# Patient Record
Sex: Female | Born: 1935 | Race: Black or African American | Hispanic: No | Smoking: Never smoker
Health system: Southern US, Community
[De-identification: ages and names within clinical notes are randomized; demographics above are authoritative.]

## PROBLEM LIST (undated history)

## (undated) DIAGNOSIS — C801 Malignant (primary) neoplasm, unspecified: Secondary | ICD-10-CM

## (undated) DIAGNOSIS — I10 Essential (primary) hypertension: Secondary | ICD-10-CM

## (undated) DIAGNOSIS — I480 Paroxysmal atrial fibrillation: Secondary | ICD-10-CM

## (undated) DIAGNOSIS — J45909 Unspecified asthma, uncomplicated: Secondary | ICD-10-CM

## (undated) DIAGNOSIS — R319 Hematuria, unspecified: Secondary | ICD-10-CM

## (undated) DIAGNOSIS — E876 Hypokalemia: Secondary | ICD-10-CM

## (undated) DIAGNOSIS — I639 Cerebral infarction, unspecified: Secondary | ICD-10-CM

## (undated) DIAGNOSIS — R1310 Dysphagia, unspecified: Secondary | ICD-10-CM

## (undated) HISTORY — DX: Dysphagia, unspecified: R13.10

## (undated) HISTORY — DX: Paroxysmal atrial fibrillation: I48.0

## (undated) HISTORY — PX: MYOMECTOMY: SHX85

## (undated) HISTORY — DX: Cerebral infarction, unspecified: I63.9

## (undated) HISTORY — DX: Hematuria, unspecified: R31.9

## (undated) HISTORY — PX: NECK SURGERY: SHX720

## (undated) HISTORY — PX: CERVICAL SPINE SURGERY: SHX589

## (undated) HISTORY — DX: Malignant (primary) neoplasm, unspecified: C80.1

## (undated) HISTORY — DX: Essential (primary) hypertension: I10

## (undated) HISTORY — DX: Hypokalemia: E87.6

## (undated) HISTORY — PX: ECTOPIC PREGNANCY SURGERY: SHX613

## (undated) HISTORY — PX: BREAST SURGERY: SHX581

## (undated) HISTORY — PX: APPENDECTOMY: SHX54

## (undated) HISTORY — DX: Unspecified asthma, uncomplicated: J45.909

## (undated) HISTORY — PX: ABDOMINAL HYSTERECTOMY: SHX81

---

## 1998-06-06 ENCOUNTER — Ambulatory Visit (HOSPITAL_COMMUNITY): Admission: RE | Admit: 1998-06-06 | Discharge: 1998-06-06 | Payer: Self-pay | Admitting: Obstetrics and Gynecology

## 1998-06-13 ENCOUNTER — Ambulatory Visit (HOSPITAL_COMMUNITY): Admission: RE | Admit: 1998-06-13 | Discharge: 1998-06-13 | Payer: Self-pay | Admitting: Obstetrics and Gynecology

## 1998-10-31 ENCOUNTER — Other Ambulatory Visit: Admission: RE | Admit: 1998-10-31 | Discharge: 1998-10-31 | Payer: Self-pay | Admitting: Obstetrics and Gynecology

## 2000-04-12 ENCOUNTER — Other Ambulatory Visit: Admission: RE | Admit: 2000-04-12 | Discharge: 2000-04-12 | Payer: Self-pay | Admitting: Obstetrics and Gynecology

## 2000-08-22 ENCOUNTER — Encounter: Payer: Self-pay | Admitting: Internal Medicine

## 2000-08-22 ENCOUNTER — Encounter: Admission: RE | Admit: 2000-08-22 | Discharge: 2000-08-22 | Payer: Self-pay | Admitting: Internal Medicine

## 2001-04-12 ENCOUNTER — Other Ambulatory Visit: Admission: RE | Admit: 2001-04-12 | Discharge: 2001-04-12 | Payer: Self-pay | Admitting: Obstetrics and Gynecology

## 2003-04-16 ENCOUNTER — Other Ambulatory Visit: Admission: RE | Admit: 2003-04-16 | Discharge: 2003-04-16 | Payer: Self-pay | Admitting: Obstetrics and Gynecology

## 2003-06-14 ENCOUNTER — Encounter: Admission: RE | Admit: 2003-06-14 | Discharge: 2003-06-14 | Payer: Self-pay | Admitting: Internal Medicine

## 2003-11-05 ENCOUNTER — Encounter: Admission: RE | Admit: 2003-11-05 | Discharge: 2003-11-05 | Payer: Self-pay | Admitting: Internal Medicine

## 2004-07-08 ENCOUNTER — Emergency Department (HOSPITAL_COMMUNITY): Admission: EM | Admit: 2004-07-08 | Discharge: 2004-07-08 | Payer: Self-pay | Admitting: Emergency Medicine

## 2004-08-29 ENCOUNTER — Emergency Department (HOSPITAL_COMMUNITY): Admission: EM | Admit: 2004-08-29 | Discharge: 2004-08-30 | Payer: Self-pay | Admitting: Emergency Medicine

## 2005-06-11 ENCOUNTER — Other Ambulatory Visit: Admission: RE | Admit: 2005-06-11 | Discharge: 2005-06-11 | Payer: Self-pay | Admitting: Obstetrics and Gynecology

## 2005-09-13 ENCOUNTER — Ambulatory Visit: Payer: Self-pay | Admitting: Internal Medicine

## 2005-09-22 ENCOUNTER — Ambulatory Visit: Payer: Self-pay | Admitting: Internal Medicine

## 2009-09-15 ENCOUNTER — Ambulatory Visit (HOSPITAL_COMMUNITY): Admission: RE | Admit: 2009-09-15 | Discharge: 2009-09-15 | Payer: Self-pay | Admitting: Surgery

## 2009-09-30 ENCOUNTER — Ambulatory Visit: Payer: Self-pay | Admitting: Oncology

## 2009-10-13 LAB — CBC WITH DIFFERENTIAL/PLATELET
BASO%: 0.2 % (ref 0.0–2.0)
Basophils Absolute: 0 10*3/uL (ref 0.0–0.1)
EOS%: 1.3 % (ref 0.0–7.0)
Eosinophils Absolute: 0.1 10*3/uL (ref 0.0–0.5)
HCT: 40.8 % (ref 34.8–46.6)
HGB: 13.7 g/dL (ref 11.6–15.9)
MCV: 93.9 fL (ref 79.5–101.0)
MONO%: 5.8 % (ref 0.0–14.0)
Platelets: 180 10*3/uL (ref 145–400)
RDW: 12.5 % (ref 11.2–14.5)
lymph#: 1.8 10*3/uL (ref 0.9–3.3)

## 2009-10-13 LAB — COMPREHENSIVE METABOLIC PANEL
BUN: 16 mg/dL (ref 6–23)
Chloride: 103 mEq/L (ref 96–112)
Creatinine, Ser: 0.78 mg/dL (ref 0.40–1.20)
Potassium: 3.8 mEq/L (ref 3.5–5.3)
Total Protein: 7.6 g/dL (ref 6.0–8.3)

## 2009-12-04 ENCOUNTER — Ambulatory Visit: Payer: Self-pay | Admitting: Oncology

## 2010-07-13 LAB — CBC
HCT: 42.1 % (ref 36.0–46.0)
Hemoglobin: 14.5 g/dL (ref 12.0–15.0)
MCHC: 34.5 g/dL (ref 30.0–36.0)
MCV: 94.3 fL (ref 78.0–100.0)
Platelets: 178 10*3/uL (ref 150–400)
RBC: 4.47 MIL/uL (ref 3.87–5.11)
RDW: 12.7 % (ref 11.5–15.5)
WBC: 5 10*3/uL (ref 4.0–10.5)

## 2010-09-25 ENCOUNTER — Encounter (INDEPENDENT_AMBULATORY_CARE_PROVIDER_SITE_OTHER): Payer: Self-pay | Admitting: Surgery

## 2010-11-09 ENCOUNTER — Encounter (INDEPENDENT_AMBULATORY_CARE_PROVIDER_SITE_OTHER): Payer: Self-pay | Admitting: Surgery

## 2010-11-10 ENCOUNTER — Encounter (INDEPENDENT_AMBULATORY_CARE_PROVIDER_SITE_OTHER): Payer: Self-pay | Admitting: Surgery

## 2010-11-10 ENCOUNTER — Ambulatory Visit (INDEPENDENT_AMBULATORY_CARE_PROVIDER_SITE_OTHER): Payer: MEDICARE | Admitting: Surgery

## 2010-11-10 VITALS — Temp 97.7°F | Wt 163.0 lb

## 2010-11-10 DIAGNOSIS — D0590 Unspecified type of carcinoma in situ of unspecified breast: Secondary | ICD-10-CM

## 2010-11-10 DIAGNOSIS — D0510 Intraductal carcinoma in situ of unspecified breast: Secondary | ICD-10-CM

## 2010-11-10 NOTE — Progress Notes (Signed)
Subjective:     Patient ID: Joy Holden, female   DOB: May 03, 1924, 77 y.o.   MRN: [id number]  HPI  She is here for long-term followup status post excision of left breast ductal carcinoma in situ. This was a year ago. She refused any further therapy. She currently has no complaints. She examines herself regularly. She denies nipple discharge. Review of Systems     Objective:   Physical Exam On examination the left breast, her incision is well-healed. There are no palpable masses. There is no axillary adenopathy.    Assessment:     Patient here for long-term followup of left breast ductal carcinoma in situ  Most recent mammograms are negative.    Plan:     She will continue her self examinations and I will see her back in one year

## 2011-11-11 ENCOUNTER — Encounter (INDEPENDENT_AMBULATORY_CARE_PROVIDER_SITE_OTHER): Payer: Self-pay

## 2011-11-16 ENCOUNTER — Ambulatory Visit (INDEPENDENT_AMBULATORY_CARE_PROVIDER_SITE_OTHER): Payer: Medicare Other | Admitting: Surgery

## 2011-11-16 ENCOUNTER — Encounter (INDEPENDENT_AMBULATORY_CARE_PROVIDER_SITE_OTHER): Payer: Self-pay | Admitting: Surgery

## 2011-11-16 VITALS — BP 142/88 | HR 45 | Temp 98.4°F | Ht 65.0 in | Wt 158.8 lb

## 2011-11-16 DIAGNOSIS — D0510 Intraductal carcinoma in situ of unspecified breast: Secondary | ICD-10-CM | POA: Insufficient documentation

## 2011-11-16 DIAGNOSIS — D0590 Unspecified type of carcinoma in situ of unspecified breast: Secondary | ICD-10-CM

## 2011-11-16 NOTE — Progress Notes (Signed)
Subjective:     Patient ID: Joy Holden, female   DOB: 1924/11/09, 78 y.o.   MRN: [id number]  HPI She is here today again for a long-term followup status post left breast lumpectomy and left sentinel lymph node biopsy for high-grade ductal carcinoma in situ in 2011. She has no complaints regarding her breast. She denies nipple discharge or palpable masses. Her most recent mammograms showed no evidence of recurrence or suspicious areas.  Review of Systems     Objective:   Physical Exam On exam, the left breast incision is well healed. There are no palpable masses. The areolar normal. There is no left axillary lymphadenopathy.    Assessment:     Long-term followup left breast ductal carcinoma in situ. Other than surgery, she has had no other therapy. Again, she denied radiation or anti-hormonal treatment.    Plan:     She will continue her self examinations and yearly mammograms. I will see her back in one year

## 2011-11-18 ENCOUNTER — Encounter (INDEPENDENT_AMBULATORY_CARE_PROVIDER_SITE_OTHER): Payer: Self-pay

## 2012-11-21 ENCOUNTER — Ambulatory Visit (INDEPENDENT_AMBULATORY_CARE_PROVIDER_SITE_OTHER): Payer: Medicare Other | Admitting: Surgery

## 2012-11-21 ENCOUNTER — Encounter (INDEPENDENT_AMBULATORY_CARE_PROVIDER_SITE_OTHER): Payer: Self-pay | Admitting: Surgery

## 2012-11-21 VITALS — BP 148/80 | HR 74 | Resp 14 | Ht 64.0 in | Wt 158.8 lb

## 2012-11-21 DIAGNOSIS — Z853 Personal history of malignant neoplasm of breast: Secondary | ICD-10-CM

## 2012-11-21 NOTE — Patient Instructions (Signed)
Patient ID: Joy Holden, female   DOB: July 08, 1924, 79 y.o.   MRN: [id number]  HPI She is here for her yearly followup status post left breast lumpectomy for ductal carcinoma in situ in 2011. Again, she refused radiation therapy or anti-hormonal therapy. She has been doing very well and her followup mammograms have remained negative. She has no complains today. She denies nipple discharge.  Review of Systems     Objective:   Physical Exam On exam, her incision and the breast is well healed and there are no palpable left breast masses. There is minimal shotty adenopathy bilaterally    Assessment:     Long-term followup with a history of DCIS     Plan:     She will continue her yearly mammograms and examinations. I will see her back in one year

## 2012-11-21 NOTE — Progress Notes (Signed)
Subjective:     Patient ID: Joy Holden, female   DOB: 10/06/1924, 79 y.o.   MRN: [id number]  HPI She is here for her yearly followup status post left breast lumpectomy for ductal carcinoma in situ in 2011. Again, she refused radiation therapy or anti-hormonal therapy. She has been doing very well and her followup mammograms have remained negative. She has no complains today. She denies nipple discharge.  Review of Systems     Objective:   Physical Exam On exam, her incision and the breast is well healed and there are no palpable left breast masses. There is minimal shotty adenopathy bilaterally    Assessment:     Long-term followup with a history of DCIS     Plan:     She will continue her yearly mammograms and examinations. I will see her back in one year       

## 2012-11-21 NOTE — Progress Notes (Signed)
Subjective:     Patient ID: Joy Holden, female   DOB: 09-29-1924, 79 y.o.   MRN: [id number]  HPI She is here for her yearly followup status post left breast lumpectomy for ductal carcinoma in situ in 2011. Again, she refused radiation therapy or anti-hormonal therapy. She has been doing very well and her followup mammograms have remained negative. She has no complains today. She denies nipple discharge.  Review of Systems     Objective:   Physical Exam On exam, her incision and the breast is well healed and there are no palpable left breast masses. There is minimal shotty adenopathy bilaterally    Assessment:     Long-term followup with a history of DCIS     Plan:     She will continue her yearly mammograms and examinations. I will see her back in one year

## 2012-11-27 ENCOUNTER — Encounter (INDEPENDENT_AMBULATORY_CARE_PROVIDER_SITE_OTHER): Payer: Self-pay

## 2013-10-22 ENCOUNTER — Ambulatory Visit (INDEPENDENT_AMBULATORY_CARE_PROVIDER_SITE_OTHER): Payer: Medicare Other | Admitting: Surgery

## 2013-10-22 ENCOUNTER — Encounter (INDEPENDENT_AMBULATORY_CARE_PROVIDER_SITE_OTHER): Payer: Self-pay | Admitting: Surgery

## 2013-10-22 VITALS — BP 123/77 | HR 70 | Temp 98.6°F | Resp 18 | Ht 65.0 in | Wt 155.0 lb

## 2013-10-22 DIAGNOSIS — Z853 Personal history of malignant neoplasm of breast: Secondary | ICD-10-CM

## 2013-10-22 NOTE — Progress Notes (Signed)
Subjective:     Patient ID: Joy Holden, female   DOB: 1925/01/18, 80 y.o.   MRN: [id number]  HPI She is here for her one-year followup for a long-term history of left breast ductal carcinoma in situ. She is now 4 years out from her original surgery. Again, she refused radiation for anti-hormonal therapy postoperatively. She is doing well and has no complaints  Review of Systems     Objective:   Physical Exam  on exam,both breasts have no masses and no axillary adenopathy. Her left breast incision is well-healed.  Her mammograms from June of this year are unremarkable with no evidence of suspicious lesions    Assessment:     Patient stable with a history of left breast cancer     Plan:     She will continue her current self examinations and yearly mammograms. I will see her back in one year

## 2013-10-31 ENCOUNTER — Encounter (INDEPENDENT_AMBULATORY_CARE_PROVIDER_SITE_OTHER): Payer: Self-pay

## 2014-09-12 ENCOUNTER — Telehealth: Payer: Self-pay | Admitting: Internal Medicine

## 2014-09-12 NOTE — Telephone Encounter (Signed)
Received records from [facility] for appointment on 11/06/14 with Dr Debara Pickett.  Records given to [facility] (medical records) for Dr Lysbeth Penner schedule on 11/05/13. lp

## 2014-09-25 ENCOUNTER — Telehealth: Payer: Self-pay | Admitting: Internal Medicine

## 2014-09-25 NOTE — Telephone Encounter (Signed)
09/24/2014 Received referral packet from [facility] for appointment on 11/06/2014 with Dr. Debara Pickett records given to Dr [facility]

## 2014-11-06 ENCOUNTER — Ambulatory Visit: Payer: Self-pay | Admitting: Internal Medicine

## 2015-02-04 NOTE — Progress Notes (Addendum)
Cardiology Office Note   Date:  02/07/2015   ID:  Joy Holden, DOB Apr 05, 1925, MRN [id number]  PCP:  Antony Blackbird, MD  Cardiologist:   Sharol Harness, MD   Chief Complaint  Patient presents with  . New Evaluation    pt states she had the flu in february/pt states she does not take flu shots or shots of any kind  . Edema    in left leg when it is hot or humid//in right leg, started after the flu  . Shortness of Breath    on exertion      History of Present Illness: Joy Holden is a 81 y.o. female with hypertension who presents for management of atrial fibrillation.  Ms. Kiely was found to have atrial fibrillation in February 2016 at which time she had the flu. She felt palpitations and diaphoresis. She was started on metoprolol and does not remember being asked to start a blood thinner. Since then she has not noted any further palpitations. She denies any chest pain, shortness of breath, lightheadedness or dizziness. She does have some chronic left lower extremity edema that she attributes to having phlebitis in the past.  She followed up with her PCP, who referred her to cardiology, presumably due to a diagnosis of atrial fibrillation.    Ms. Boonstra is very reluctant about receiving medical care.  She had 2 children who both died. Her son went for a heart catheterization and somehow ended up with splenic rupture and died on the cath table. A year later her daughter died of pneumonia. Given that her son died of medical error and she feels that her daughter should have been saved, she is reluctant to seek medical care.  Ms. Kimmer does not get formal exercise but she is very active.  She mows her own lawn with both a push and riding mower.  She also likes to garden and raises green beans, spinach, asparagus, squash, cucumbers, tomatoes, eggplant's, and many other vegetables. She does not get any chest pain with these activities but does have shortness of breath with extreme  exertion.   Past Medical History  Diagnosis Date  . Hypertension   . Cancer   . Asthma     Past Surgical History  Procedure Laterality Date  . Appendectomy    . Abdominal hysterectomy      PARTIAL  . Cervical spine surgery    . Myomectomy    . Breast surgery    . Ectopic pregnancy surgery    . Neck surgery      c-spine     Current Outpatient Prescriptions  Medication Sig Dispense Refill  . b complex vitamins tablet Take 1 tablet by mouth daily.      . Cholecalciferol (VITAMIN D3) 1000 UNITS CAPS Take by mouth daily.    Marland Kitchen diltiazem (TIAZAC) 180 MG 24 hr capsule Take 180 mg by mouth daily.      Marland Kitchen GARLIC PO Take 1 each by mouth daily. One clove per day     . Ginger, Zingiber officinalis, (GINGER ROOT PO) Take 1 each by mouth daily. One fresh slice daily      . lisinopril (PRINIVIL,ZESTRIL) 40 MG tablet Take 40 mg by mouth daily.  0  . magnesium gluconate (MAGONATE) 500 MG tablet Take 500 mg by mouth 2 (two) times daily.     . metoprolol (TOPROL-XL) 100 MG 24 hr tablet Take 100 mg by mouth daily.      Marland Kitchen  Multiple Vitamin (MULTI VITAMIN DAILY PO) Take 1 tablet by mouth daily.    . potassium chloride SA (K-DUR,KLOR-CON) 20 MEQ tablet Take 20 mEq by mouth 2 (two) times daily.      . vitamin C (ASCORBIC ACID) 500 MG tablet Take 500 mg by mouth daily.      . vitamin E 400 UNIT capsule Take 400 Units by mouth daily.     . hydrALAZINE (APRESOLINE) 25 MG tablet Take 1 tablet (25 mg total) by mouth 3 (three) times daily. 90 tablet 11   No current facility-administered medications for this visit.    Allergies:   Penicillins; Aspirin; and Sulfur    Social History:  The patient  reports that she has never smoked. She has never used smokeless tobacco. She reports that she does not drink alcohol or use illicit drugs.   Family History:  The patient's family history includes Cancer in her sister; Hypertension in her mother.    ROS:  Please see the history of present illness.    Otherwise, review of systems are positive for none.   All other systems are reviewed and negative.    PHYSICAL EXAM: VS:  BP 200/98 mmHg  Pulse 56  Ht 5\' 5"  (1.651 m)  Wt 65.817 kg (145 lb 1.6 oz)  BMI 24.15 kg/m2 , BMI Body mass index is 24.15 kg/(m^2). GENERAL:  Well appearing HEENT:  Pupils equal round and reactive, fundi not visualized, oral mucosa unremarkable NECK:  No jugular venous distention, waveform within normal limits, carotid upstroke brisk and symmetric, no bruits, no thyromegaly LYMPHATICS:  No cervical adenopathy LUNGS:  Clear to auscultation bilaterally HEART:  Irregularly irregular.  PMI not displaced or sustained,S1 and S2 within normal limits, no S3, no S4, no clicks, no rubs, no murmurs ABD:  Flat, positive bowel sounds normal in frequency in pitch, no bruits, no rebound, no guarding, no midline pulsatile mass, no hepatomegaly, no splenomegaly EXT:  2 plus pulses throughout, no edema, no cyanosis no clubbing SKIN:  No rashes no nodules NEURO:  Cranial nerves II through XII grossly intact, motor grossly intact throughout PSYCH:  Cognitively intact, oriented to person place and time   EKG:  EKG is ordered today. The ekg ordered today demonstrates atrial fibrillation.  Rate 56 bpm.  Inferolateral T wave inversions.   Recent Labs: No results found for requested labs within last 365 days.    Lipid Panel No results found for: CHOL, TRIG, HDL, CHOLHDL, VLDL, LDLCALC, LDLDIRECT 01/22/15: Chol 140, tri 55, HDL 49, LDL 80 Na 139, K 4.1, BUN 18, creatinine 0.75  Wt Readings from Last 3 Encounters:  02/05/15 65.817 kg (145 lb 1.6 oz)  10/22/13 70.308 kg (155 lb)  11/21/12 72.031 kg (158 lb 12.8 oz)      ASSESSMENT AND PLAN:  # Atrial fibrillation: Rate is well-controlled.  In fact it is low.  However she denies exertional dyspnea.  Given that her blood pressure is high, I would prefer not to lower her metoprolol.  She declines anticoagulation despite my attempts  to discuss stroke risk.  This patients CHA2DS2-VASc Score and unadjusted Ischemic Stroke Rate (% per year) is equal to 3.2 % stroke rate/year from a score of 3  Above score calculated as 1 point each if present [CHF, HTN, DM, Vascular=MI/PAD/Aortic Plaque, Age if 65-74, or Female] Above score calculated as 2 points each if present [Age > 75, or Stroke/TIA/TE]   # Hypertension: Blood pressure is very poorly-controlled.  She is not willing  to take HCTZ because it made her urinate at night.  Norvasc caused lower extremity edema.  She also had an intolerances to carvedilol.  We will start hydralazine 25 mg tid.  I considered spironolactone, but this is unlikely to get her to goal.  It can be added as needed.  She will follow up with our pharmacist, Tommy Medal, in 2 weeks.     Current medicines are reviewed at length with the patient today.  The patient does not have concerns regarding medicines.  The following changes have been made:  Start hydralazine 25 mg tid.  Labs/ tests ordered today include:   Orders Placed This Encounter  Procedures  . EKG 12-Lead     Disposition:   FU with Donnis Pecha C. Oval Linsey, MD in 6 months, Tommy Medal, PharmD in 2 weeks.  I spent one hour with Ms. Reddy.  Signed, Sharol Harness, MD  02/07/2015 2:28 PM    [place]

## 2015-02-05 ENCOUNTER — Ambulatory Visit (INDEPENDENT_AMBULATORY_CARE_PROVIDER_SITE_OTHER): Payer: Medicare Other | Admitting: Cardiovascular Disease

## 2015-02-05 VITALS — BP 200/98 | HR 56 | Ht 65.0 in | Wt 145.1 lb

## 2015-02-05 DIAGNOSIS — I10 Essential (primary) hypertension: Secondary | ICD-10-CM

## 2015-02-05 MED ORDER — HYDRALAZINE HCL 25 MG PO TABS: 25.0000 mg | ORAL_TABLET | Freq: Three times a day (TID) | ORAL | Status: DC

## 2015-02-05 NOTE — Patient Instructions (Addendum)
Your physician has recommended you make the following change in your medication: START HYDRALAZINE 25MG   -  TAKE ONE TABLET 3 TIMES DAILY.  Your physician recommends that you schedule a follow-up appointment in: [place].  Your physician recommends that you schedule a follow-up appointment in: [place] DR. [place].

## 2015-02-07 ENCOUNTER — Encounter: Payer: Self-pay | Admitting: Cardiovascular Disease

## 2015-02-20 ENCOUNTER — Ambulatory Visit (INDEPENDENT_AMBULATORY_CARE_PROVIDER_SITE_OTHER): Payer: Medicare Other | Admitting: Pharmacist Clinician (PhC)/ Clinical Pharmacy Specialist

## 2015-02-20 ENCOUNTER — Encounter: Payer: Self-pay | Admitting: Pharmacist Clinician (PhC)/ Clinical Pharmacy Specialist

## 2015-02-20 VITALS — BP 162/82 | HR 64 | Ht 65.0 in | Wt 145.0 lb

## 2015-02-20 DIAGNOSIS — I10 Essential (primary) hypertension: Secondary | ICD-10-CM | POA: Diagnosis not present

## 2015-02-20 NOTE — Progress Notes (Signed)
02/20/2015 Joy Holden 1924/10/12 [id number] HPI:  Joy Holden is a 81 y.o. female patient of Dr Oval Linsey, with a [place] below who presents today for hypertension clinic evaluation. She saw Dr. Oval Linsey about 2 weeks ago for atrial fibrillation management as well as hypertension.  She has been taking diltiazem 180 mg once daily, lisinopril 40 mg once daily and metoprolol succinate 100 mg once daily.  She states that this controlled her BP fairly well until she had the flu earlier this year.  Since then it has been harder to keep it controlled.  She also has some fear/distrust of the medical community since her son died 4 years ago after a heart catheterization somehow led to a splenic rupture.  Her daughter then died just a year later with pneumonia.   She admits to becoming tense when going to see doctors, and she is tearful in the office today talking about how she still misses her children every day.  She does have 3 grandchildren and perks up a little when talking about them.  Cardiac Hx: AF, some chronic LEE (phlebitis in the past)  Social Hx: no tobacco, alcohol or caffeine  Diet: eats all home cooked foods, uses Ms Deliah Boston and no sodium.  Plenty of fruits and vegetables  Exercise: no formal exercise, but does her own yard work, including mowing and gardening  Home BP readings: has home cuff but has been hesitant to use it  Current antihypertensive medications: diltiazem 180, lisinopril 40, metoprolol succ 100 - all once daily   Current Outpatient Prescriptions  Medication Sig Dispense Refill  . hydrALAZINE (APRESOLINE) 25 MG tablet Take 25 mg by mouth 2 (two) times daily.    Marland Kitchen b complex vitamins tablet Take 1 tablet by mouth daily.      . Cholecalciferol (VITAMIN D3) 1000 UNITS CAPS Take by mouth daily.    Marland Kitchen diltiazem (TIAZAC) 180 MG 24 hr capsule Take 180 mg by mouth daily.      Marland Kitchen GARLIC PO Take 1 each by mouth daily. One clove per day     . Ginger, Zingiber officinalis,  (GINGER ROOT PO) Take 1 each by mouth daily. One fresh slice daily      . lisinopril (PRINIVIL,ZESTRIL) 40 MG tablet Take 40 mg by mouth daily.  0  . magnesium gluconate (MAGONATE) 500 MG tablet Take 500 mg by mouth 2 (two) times daily.     . metoprolol (TOPROL-XL) 100 MG 24 hr tablet Take 100 mg by mouth daily.      . Multiple Vitamin (MULTI VITAMIN DAILY PO) Take 1 tablet by mouth daily.    . potassium chloride SA (K-DUR,KLOR-CON) 20 MEQ tablet Take 20 mEq by mouth 2 (two) times daily.      . vitamin C (ASCORBIC ACID) 500 MG tablet Take 500 mg by mouth daily.      . vitamin E 400 UNIT capsule Take 400 Units by mouth daily.      No current facility-administered medications for this visit.    Allergies  Allergen Reactions  . Penicillins Rash  . Aspirin     Burning in stomach  . Sulfur Nausea Only    Past Medical History  Diagnosis Date  . Hypertension   . Cancer ([place])   . Asthma     Blood pressure 162/82, pulse 64, height 5\' 5"  (1.651 m), weight 145 lb (65.772 kg).    Tommy Medal PharmD CPP [facility]

## 2015-02-20 NOTE — Patient Instructions (Signed)
Return for a a follow up appointment in 3 weeks  Your blood pressure today is 162/82 (goal is <150/90)  Check your blood pressure at home several times each week and keep record of the readings.  Take your BP meds as follows:  Continue all current medications  Bring all of your meds, your BP cuff and your record of home blood pressures to your next appointment.  Exercise as you're able, try to walk approximately 30 minutes per day.  Keep salt intake to a minimum, especially watch canned and prepared boxed foods.  Eat more fresh fruits and vegetables and fewer canned items.  Avoid eating in fast food restaurants.    HOW TO TAKE YOUR BLOOD PRESSURE: . Rest 5 minutes before taking your blood pressure. .  Don't smoke or drink caffeinated beverages for at least 30 minutes before. . Take your blood pressure before (not after) you eat. . Sit comfortably with your back supported and both feet on the floor (don't cross your legs). . Elevate your arm to heart level on a table or a desk. . Use the proper sized cuff. It should fit smoothly and snugly around your bare upper arm. There should be enough room to slip a fingertip under the cuff. The bottom edge of the cuff should be 1 inch above the crease of the elbow. . Ideally, take 3 measurements at one sitting and record the average.

## 2015-02-20 NOTE — Assessment & Plan Note (Signed)
We had a good talk today about her general health and blood pressure.  She is adamant about trying more medications, and believes that fresh air and her vitamins are really what works best.  She did decrease her hydralazine to twice daily as it was causing some nausea.  She also complains about the bitter taste of the tablet, but is willing to continue twice daily for now.  I did ask her to get out her BP cuff at home and check her readings 3-4 times each week.  I believe some of her elevated BP is related to her tenseness in being in medical offices.  She was resistant to take home readings, but I explained that she may actually have better readings at home and may not need as much medication as her office readings would indicate.  I offered to have her call me in 2 weeks with the readings, but she said she would be willing to come back.  She will bring her cuff into the office so we can validate her home pressures.  I will see her in 3 weeks

## 2015-02-26 ENCOUNTER — Encounter: Payer: Self-pay | Admitting: Cardiovascular Disease

## 2015-03-13 ENCOUNTER — Ambulatory Visit: Payer: Medicare Other | Admitting: Pharmacist Clinician (PhC)/ Clinical Pharmacy Specialist

## 2015-03-17 ENCOUNTER — Ambulatory Visit (INDEPENDENT_AMBULATORY_CARE_PROVIDER_SITE_OTHER): Payer: Medicare Other | Admitting: Pharmacist Clinician (PhC)/ Clinical Pharmacy Specialist

## 2015-03-17 VITALS — BP 180/78 | HR 52 | Ht 65.0 in | Wt 144.8 lb

## 2015-03-17 DIAGNOSIS — I10 Essential (primary) hypertension: Secondary | ICD-10-CM | POA: Diagnosis not present

## 2015-03-17 NOTE — Patient Instructions (Addendum)
Return for a a follow up appointment in 2 months  Your blood pressure today is 180/78 (goal is <150/90)  Check your blood pressure at home several times per week and keep record of the readings.  Take your BP meds as follows: continue with your current medications  Join the [facility] and get plenty of exercise!  Bring all of your meds, your BP cuff and your record of home blood pressures to your next appointment.  Exercise as you're able, try to walk approximately 30 minutes per day.  Keep salt intake to a minimum, especially watch canned and prepared boxed foods.  Eat more fresh fruits and vegetables and fewer canned items.  Avoid eating in fast food restaurants.    HOW TO TAKE YOUR BLOOD PRESSURE: . Rest 5 minutes before taking your blood pressure. .  Don't smoke or drink caffeinated beverages for at least 30 minutes before. . Take your blood pressure before (not after) you eat. . Sit comfortably with your back supported and both feet on the floor (don't cross your legs). . Elevate your arm to heart level on a table or a desk. . Use the proper sized cuff. It should fit smoothly and snugly around your bare upper arm. There should be enough room to slip a fingertip under the cuff. The bottom edge of the cuff should be 1 inch above the crease of the elbow. . Ideally, take 3 measurements at one sitting and record the average.

## 2015-03-18 ENCOUNTER — Encounter: Payer: Self-pay | Admitting: Pharmacist Clinician (PhC)/ Clinical Pharmacy Specialist

## 2015-03-18 NOTE — Assessment & Plan Note (Signed)
Because her pressure is still elevated in the office I tried to convince her of the need to increase her hydralazine dose.  Suggested we could do 1.5 tablets bid rather than 1 tid, but she pointed out that this was still 3 tablets per day and her body would not accept that.  After a long discussion she assured me that she would be fine, she was planning to join the [facility] and start exercising, but that she did not need any further medications.  I suggested again that she try to take home BP readings and we could follow up in 2 months.  She seems agreeable to this, although I don't think there will be any need to follow her further if she does not check home BP readings.

## 2015-03-18 NOTE — Progress Notes (Signed)
03/18/2015 Joy Holden [birth year] [id number] HPI:  Joy Holden is a 81 y.o. female patient of Dr Oval Linsey, with a [place] below who presents today for hypertension clinic follow up. She saw Dr. Oval Linsey about 6 weeks ago for atrial fibrillation management as well as hypertension.  She has been taking diltiazem 180 mg once daily, lisinopril 40 mg once daily and metoprolol succinate 100 mg once daily.  Dr. Oval Linsey started her on hydralazine 25 mg tid and she followed up with me about 2 weeks later.  At that time she was only taking the hydralazine twice daily, as she stated she could not tolerate 3 tablets per day.  It made her feel worn out and it "wasn't good for her body".   At our last visit, we made no changes, but she agreed to take her BP at home, so that we could try and determine if she has mostly white coat hypertension.  She has some fear/distrust of the medical community since her son died 4 years ago after a heart catheterization somehow led to a splenic rupture.  Her daughter then died just a year later with pneumonia.   She admits to becoming tense when going to see doctors, and often talks about how she still misses her children every day.  She does have 3 grandchildren and perks up a little when talking about them.  At the end of that meeting, last month, she stated she would check home pressures as well as increase her activity/exercise, as she did not want to add any medications.  Today she come in with her BP cuff.  She only tried to use it twice, both times incorrectly and received an error message.  She did not have the patience to try further.  In office today it worked fine, although the reading was 20/10 points higher than office cuff.  She also has not increased her exercise.    Cardiac Hx: AF, some chronic LEE (phlebitis in the past)  Social Hx: no tobacco, alcohol or caffeine  Diet: eats all home cooked foods, uses Ms [employer]iah [employer] and no sodium.  Plenty of fruits and  vegetables  Exercise: no formal exercise, but does her own yard work, including mowing and gardening  Home BP readings: did not check  Current antihypertensive medications: diltiazem 180, lisinopril 40, metoprolol succ 100 - all once daily; hydralazine 25 mg bid   Current Outpatient Prescriptions  Medication Sig Dispense Refill  . b complex vitamins tablet Take 1 tablet by mouth daily.      . Cholecalciferol (VITAMIN D3) 1000 UNITS CAPS Take by mouth daily.    Marland Kitchen diltiazem (TIAZAC) 180 MG 24 hr capsule Take 180 mg by mouth daily.      Marland Kitchen GARLIC PO Take 1 each by mouth daily. One clove per day     . Ginger, Zingiber officinalis, (GINGER ROOT PO) Take 1 each by mouth daily. One fresh slice daily      . hydrALAZINE (APRESOLINE) 25 MG tablet Take 25 mg by mouth 2 (two) times daily.    Marland Kitchen lisinopril (PRINIVIL,ZESTRIL) 40 MG tablet Take 40 mg by mouth daily.  0  . magnesium gluconate (MAGONATE) 500 MG tablet Take 500 mg by mouth 2 (two) times daily.     . metoprolol (TOPROL-XL) 100 MG 24 hr tablet Take 100 mg by mouth daily.      . Multiple Vitamin (MULTI VITAMIN DAILY PO) Take 1 tablet by mouth daily.    Marland Kitchen  potassium chloride SA (K-DUR,KLOR-CON) 20 MEQ tablet Take 20 mEq by mouth 2 (two) times daily.      . vitamin C (ASCORBIC ACID) 500 MG tablet Take 500 mg by mouth daily.      . vitamin E 400 UNIT capsule Take 400 Units by mouth daily.      No current facility-administered medications for this visit.    Allergies  Allergen Reactions  . Penicillins Rash  . Aspirin     Burning in stomach  . Morphine     Other reaction(s): Delusions (intolerance)  . Sulfur Nausea Only    Past Medical History  Diagnosis Date  . Hypertension   . Cancer ([place])   . Asthma     Blood pressure 180/78, pulse 52, height 5\' 5"  (1.651 m), weight 144 lb 12.8 oz (65.681 kg).    Tommy Medal PharmD CPP [facility]

## 2015-07-19 ENCOUNTER — Emergency Department (HOSPITAL_COMMUNITY): Payer: PPO

## 2015-07-19 ENCOUNTER — Inpatient Hospital Stay (HOSPITAL_COMMUNITY): Payer: PPO | Admitting: Anesthesiology

## 2015-07-19 ENCOUNTER — Inpatient Hospital Stay (HOSPITAL_COMMUNITY): Payer: PPO

## 2015-07-19 ENCOUNTER — Encounter (HOSPITAL_COMMUNITY)
Admission: EM | Disposition: A | Discharge status: Skilled Nursing Facility | Payer: Self-pay | Source: Home / Self Care | Attending: Neurology

## 2015-07-19 ENCOUNTER — Inpatient Hospital Stay (HOSPITAL_COMMUNITY)
Admission: EM | Admit: 2015-07-19 | Discharge: 2015-07-28 | DRG: 023 | Disposition: A | Discharge status: Skilled Nursing Facility | Payer: PPO | Attending: Neurology | Admitting: Neurology

## 2015-07-19 ENCOUNTER — Encounter (HOSPITAL_COMMUNITY): Payer: Self-pay | Admitting: Radiology

## 2015-07-19 DIAGNOSIS — B9620 Unspecified Escherichia coli [E. coli] as the cause of diseases classified elsewhere: Secondary | ICD-10-CM | POA: Diagnosis not present

## 2015-07-19 DIAGNOSIS — R451 Restlessness and agitation: Secondary | ICD-10-CM | POA: Diagnosis present

## 2015-07-19 DIAGNOSIS — G8191 Hemiplegia, unspecified affecting right dominant side: Secondary | ICD-10-CM | POA: Diagnosis present

## 2015-07-19 DIAGNOSIS — R319 Hematuria, unspecified: Secondary | ICD-10-CM | POA: Diagnosis not present

## 2015-07-19 DIAGNOSIS — E876 Hypokalemia: Secondary | ICD-10-CM | POA: Diagnosis present

## 2015-07-19 DIAGNOSIS — I4891 Unspecified atrial fibrillation: Secondary | ICD-10-CM | POA: Diagnosis not present

## 2015-07-19 DIAGNOSIS — I6789 Other cerebrovascular disease: Secondary | ICD-10-CM | POA: Diagnosis not present

## 2015-07-19 DIAGNOSIS — R1310 Dysphagia, unspecified: Secondary | ICD-10-CM | POA: Diagnosis not present

## 2015-07-19 DIAGNOSIS — I63422 Cerebral infarction due to embolism of left anterior cerebral artery: Secondary | ICD-10-CM | POA: Diagnosis present

## 2015-07-19 DIAGNOSIS — I482 Chronic atrial fibrillation: Secondary | ICD-10-CM | POA: Diagnosis present

## 2015-07-19 DIAGNOSIS — Z886 Allergy status to analgesic agent status: Secondary | ICD-10-CM

## 2015-07-19 DIAGNOSIS — R29810 Facial weakness: Secondary | ICD-10-CM | POA: Diagnosis present

## 2015-07-19 DIAGNOSIS — R001 Bradycardia, unspecified: Secondary | ICD-10-CM | POA: Diagnosis present

## 2015-07-19 DIAGNOSIS — Z0189 Encounter for other specified special examinations: Secondary | ICD-10-CM

## 2015-07-19 DIAGNOSIS — R739 Hyperglycemia, unspecified: Secondary | ICD-10-CM | POA: Diagnosis present

## 2015-07-19 DIAGNOSIS — D696 Thrombocytopenia, unspecified: Secondary | ICD-10-CM | POA: Diagnosis present

## 2015-07-19 DIAGNOSIS — I6522 Occlusion and stenosis of left carotid artery: Secondary | ICD-10-CM

## 2015-07-19 DIAGNOSIS — I639 Cerebral infarction, unspecified: Secondary | ICD-10-CM | POA: Diagnosis present

## 2015-07-19 DIAGNOSIS — Z4659 Encounter for fitting and adjustment of other gastrointestinal appliance and device: Secondary | ICD-10-CM

## 2015-07-19 DIAGNOSIS — G8101 Flaccid hemiplegia affecting right dominant side: Secondary | ICD-10-CM

## 2015-07-19 DIAGNOSIS — I69320 Aphasia following cerebral infarction: Secondary | ICD-10-CM | POA: Diagnosis not present

## 2015-07-19 DIAGNOSIS — R29726 NIHSS score 26: Secondary | ICD-10-CM | POA: Diagnosis present

## 2015-07-19 DIAGNOSIS — Z978 Presence of other specified devices: Secondary | ICD-10-CM

## 2015-07-19 DIAGNOSIS — R4182 Altered mental status, unspecified: Secondary | ICD-10-CM | POA: Diagnosis present

## 2015-07-19 DIAGNOSIS — Z66 Do not resuscitate: Secondary | ICD-10-CM | POA: Diagnosis present

## 2015-07-19 DIAGNOSIS — I63412 Cerebral infarction due to embolism of left middle cerebral artery: Principal | ICD-10-CM | POA: Diagnosis present

## 2015-07-19 DIAGNOSIS — J95821 Acute postprocedural respiratory failure: Secondary | ICD-10-CM | POA: Diagnosis not present

## 2015-07-19 DIAGNOSIS — R471 Dysarthria and anarthria: Secondary | ICD-10-CM | POA: Diagnosis present

## 2015-07-19 DIAGNOSIS — I160 Hypertensive urgency: Secondary | ICD-10-CM

## 2015-07-19 DIAGNOSIS — I63032 Cerebral infarction due to thrombosis of left carotid artery: Secondary | ICD-10-CM

## 2015-07-19 DIAGNOSIS — Q251 Coarctation of aorta: Secondary | ICD-10-CM

## 2015-07-19 DIAGNOSIS — I509 Heart failure, unspecified: Secondary | ICD-10-CM

## 2015-07-19 DIAGNOSIS — H53461 Homonymous bilateral field defects, right side: Secondary | ICD-10-CM | POA: Diagnosis present

## 2015-07-19 DIAGNOSIS — I480 Paroxysmal atrial fibrillation: Secondary | ICD-10-CM | POA: Diagnosis present

## 2015-07-19 DIAGNOSIS — I340 Nonrheumatic mitral (valve) insufficiency: Secondary | ICD-10-CM | POA: Diagnosis present

## 2015-07-19 DIAGNOSIS — N390 Urinary tract infection, site not specified: Secondary | ICD-10-CM | POA: Diagnosis not present

## 2015-07-19 DIAGNOSIS — I481 Persistent atrial fibrillation: Secondary | ICD-10-CM | POA: Diagnosis not present

## 2015-07-19 DIAGNOSIS — E785 Hyperlipidemia, unspecified: Secondary | ICD-10-CM | POA: Diagnosis present

## 2015-07-19 DIAGNOSIS — R4701 Aphasia: Secondary | ICD-10-CM | POA: Diagnosis not present

## 2015-07-19 DIAGNOSIS — J9601 Acute respiratory failure with hypoxia: Secondary | ICD-10-CM | POA: Diagnosis not present

## 2015-07-19 DIAGNOSIS — I63132 Cerebral infarction due to embolism of left carotid artery: Secondary | ICD-10-CM | POA: Diagnosis not present

## 2015-07-19 DIAGNOSIS — I10 Essential (primary) hypertension: Secondary | ICD-10-CM | POA: Diagnosis present

## 2015-07-19 DIAGNOSIS — J9620 Acute and chronic respiratory failure, unspecified whether with hypoxia or hypercapnia: Secondary | ICD-10-CM | POA: Diagnosis not present

## 2015-07-19 HISTORY — PX: RADIOLOGY WITH ANESTHESIA: SHX6223

## 2015-07-19 LAB — PROTIME-INR
INR: 1.2 (ref 0.00–1.49)
Prothrombin Time: 15.3 seconds — ABNORMAL HIGH (ref 11.6–15.2)

## 2015-07-19 LAB — CBC
HEMATOCRIT: 44 % (ref 36.0–46.0)
HEMOGLOBIN: 14.4 g/dL (ref 12.0–15.0)
MCH: 31 pg (ref 26.0–34.0)
MCHC: 32.7 g/dL (ref 30.0–36.0)
MCV: 94.8 fL (ref 78.0–100.0)
Platelets: 138 10*3/uL — ABNORMAL LOW (ref 150–400)
RBC: 4.64 MIL/uL (ref 3.87–5.11)
RDW: 12.7 % (ref 11.5–15.5)
WBC: 5.9 10*3/uL (ref 4.0–10.5)

## 2015-07-19 LAB — I-STAT TROPONIN, ED: TROPONIN I, POC: 0 ng/mL (ref 0.00–0.08)

## 2015-07-19 LAB — URINALYSIS, ROUTINE W REFLEX MICROSCOPIC
BILIRUBIN URINE: NEGATIVE
Glucose, UA: NEGATIVE mg/dL
HGB URINE DIPSTICK: NEGATIVE
Ketones, ur: NEGATIVE mg/dL
Leukocytes, UA: NEGATIVE
NITRITE: NEGATIVE
Protein, ur: NEGATIVE mg/dL
Specific Gravity, Urine: 1.023 (ref 1.005–1.030)
pH: 6.5 (ref 5.0–8.0)

## 2015-07-19 LAB — COMPREHENSIVE METABOLIC PANEL
ALK PHOS: 107 U/L (ref 38–126)
ALT: 26 U/L (ref 14–54)
AST: 25 U/L (ref 15–41)
Albumin: 4.2 g/dL (ref 3.5–5.0)
Anion gap: 10 (ref 5–15)
BILIRUBIN TOTAL: 0.7 mg/dL (ref 0.3–1.2)
BUN: 21 mg/dL — AB (ref 6–20)
CALCIUM: 9.3 mg/dL (ref 8.9–10.3)
CHLORIDE: 108 mmol/L (ref 101–111)
CO2: 23 mmol/L (ref 22–32)
CREATININE: 0.83 mg/dL (ref 0.44–1.00)
Glucose, Bld: 140 mg/dL — ABNORMAL HIGH (ref 65–99)
Potassium: 3.8 mmol/L (ref 3.5–5.1)
Sodium: 141 mmol/L (ref 135–145)
Total Protein: 7.2 g/dL (ref 6.5–8.1)

## 2015-07-19 LAB — DIFFERENTIAL
BASOS ABS: 0 10*3/uL (ref 0.0–0.1)
BASOS PCT: 1 %
Eosinophils Absolute: 0.1 10*3/uL (ref 0.0–0.7)
Eosinophils Relative: 2 %
LYMPHS ABS: 2.6 10*3/uL (ref 0.7–4.0)
LYMPHS PCT: 44 %
MONO ABS: 0.5 10*3/uL (ref 0.1–1.0)
MONOS PCT: 8 %
NEUTROS ABS: 2.7 10*3/uL (ref 1.7–7.7)
Neutrophils Relative %: 46 %

## 2015-07-19 LAB — ETHANOL

## 2015-07-19 LAB — I-STAT CHEM 8, ED
BUN: 26 mg/dL — ABNORMAL HIGH (ref 6–20)
CALCIUM ION: 1.05 mmol/L — AB (ref 1.13–1.30)
CHLORIDE: 106 mmol/L (ref 101–111)
CREATININE: 0.8 mg/dL (ref 0.44–1.00)
GLUCOSE: 134 mg/dL — AB (ref 65–99)
HCT: 47 % — ABNORMAL HIGH (ref 36.0–46.0)
Hemoglobin: 16 g/dL — ABNORMAL HIGH (ref 12.0–15.0)
POTASSIUM: 3.7 mmol/L (ref 3.5–5.1)
Sodium: 143 mmol/L (ref 135–145)
TCO2: 24 mmol/L (ref 0–100)

## 2015-07-19 LAB — APTT: APTT: 31 s (ref 24–37)

## 2015-07-19 LAB — RAPID URINE DRUG SCREEN, HOSP PERFORMED
AMPHETAMINES: NOT DETECTED
BARBITURATES: NOT DETECTED
BENZODIAZEPINES: NOT DETECTED
Cocaine: NOT DETECTED
Opiates: NOT DETECTED
Tetrahydrocannabinol: NOT DETECTED

## 2015-07-19 LAB — CBG MONITORING, ED: GLUCOSE-CAPILLARY: 115 mg/dL — AB (ref 65–99)

## 2015-07-19 SURGERY — RADIOLOGY WITH ANESTHESIA
Anesthesia: General

## 2015-07-19 MED ORDER — ALTEPLASE (STROKE) FULL DOSE INFUSION
0.9000 mg/kg | Freq: Once | INTRAVENOUS | Status: AC
  Administered 2015-07-19: 63 mg via INTRAVENOUS
  Filled 2015-07-19: qty 100

## 2015-07-19 MED ORDER — EPHEDRINE SULFATE 50 MG/ML IJ SOLN
INTRAMUSCULAR | Status: DC | PRN
  Administered 2015-07-19 – 2015-07-20 (×3): 5 mg via INTRAVENOUS

## 2015-07-19 MED ORDER — ALTEPLASE 30 MG/30 ML FOR INTERV. RAD
1.0000 mg | INTRA_ARTERIAL | Status: AC | PRN
Start: 2015-07-19 — End: 2015-07-20
  Administered 2015-07-19: 5 mg via INTRA_ARTERIAL
  Filled 2015-07-19: qty 30

## 2015-07-19 MED ORDER — LIDOCAINE HCL (CARDIAC) 20 MG/ML IV SOLN
INTRAVENOUS | Status: DC | PRN
  Administered 2015-07-19: 60 mg via INTRAVENOUS

## 2015-07-19 MED ORDER — HYDRALAZINE HCL 20 MG/ML IJ SOLN
20.0000 mg | Freq: Once | INTRAMUSCULAR | Status: AC
  Administered 2015-07-19: 20 mg via INTRAVENOUS

## 2015-07-19 MED ORDER — SUCCINYLCHOLINE CHLORIDE 20 MG/ML IJ SOLN
INTRAMUSCULAR | Status: DC | PRN
  Administered 2015-07-19: 100 mg via INTRAVENOUS

## 2015-07-19 MED ORDER — HYDRALAZINE HCL 20 MG/ML IJ SOLN
INTRAMUSCULAR | Status: AC
  Filled 2015-07-19: qty 1

## 2015-07-19 MED ORDER — ARTIFICIAL TEARS OP OINT
TOPICAL_OINTMENT | OPHTHALMIC | Status: DC | PRN
  Administered 2015-07-19: 1 via OPHTHALMIC

## 2015-07-19 MED ORDER — PHENYLEPHRINE HCL 10 MG/ML IJ SOLN
INTRAMUSCULAR | Status: DC | PRN
  Administered 2015-07-19: 80 ug via INTRAVENOUS

## 2015-07-19 MED ORDER — VANCOMYCIN HCL IN DEXTROSE 1-5 GM/200ML-% IV SOLN
1000.0000 mg | Freq: Once | INTRAVENOUS | Status: AC
  Administered 2015-07-19: 1000 mg via INTRAVENOUS
  Filled 2015-07-19: qty 200

## 2015-07-19 MED ORDER — NITROGLYCERIN 1 MG/10 ML FOR IR/CATH LAB
INTRA_ARTERIAL | Status: AC
  Filled 2015-07-19: qty 10

## 2015-07-19 MED ORDER — FENTANYL CITRATE (PF) 100 MCG/2ML IJ SOLN
INTRAMUSCULAR | Status: DC | PRN
  Administered 2015-07-19: 150 ug via INTRAVENOUS

## 2015-07-19 MED ORDER — PROPOFOL 10 MG/ML IV BOLUS
INTRAVENOUS | Status: DC | PRN
  Administered 2015-07-19: 80 mg via INTRAVENOUS

## 2015-07-19 MED ORDER — ROCURONIUM BROMIDE 100 MG/10ML IV SOLN
INTRAVENOUS | Status: DC | PRN
  Administered 2015-07-19 – 2015-07-20 (×3): 50 mg via INTRAVENOUS

## 2015-07-19 MED ORDER — SODIUM CHLORIDE 0.9 % IV SOLN
INTRAVENOUS | Status: DC | PRN
  Administered 2015-07-19: 23:00:00 via INTRAVENOUS

## 2015-07-19 MED ORDER — LACTATED RINGERS IV SOLN
INTRAVENOUS | Status: DC | PRN
  Administered 2015-07-19: 23:00:00 via INTRAVENOUS

## 2015-07-19 MED ORDER — IOHEXOL 350 MG/ML SOLN
100.0000 mL | Freq: Once | INTRAVENOUS | Status: AC | PRN
  Administered 2015-07-19: 90 mL via INTRAVENOUS

## 2015-07-19 NOTE — ED Provider Notes (Signed)
Patient was found seated in her car at the bottom driveway less responsive. Paramedics noted patient to have right-sided weakness. On exam patient has left 4 days. Opens eyes to verbal stimulus. Code stroke called in the field. Patient seen by Dr. Nicole Kindred on arrival. TPA administered in the ED. She will be admitted to intensive care unit  Orlie Dakin, MD 07/20/15 [id number]

## 2015-07-19 NOTE — Anesthesia Preprocedure Evaluation (Signed)
Anesthesia Evaluation  Patient identified by MRN, date of birth, ID bandPreop documentation limited or incomplete due to emergent nature of procedure.  History of Anesthesia Complications Negative for: history of anesthetic complications  Airway Mallampati: II  TM Distance: >3 FB Neck ROM: full    Dental no notable dental hx.    Pulmonary neg pulmonary ROS, asthma ,    Pulmonary exam normal breath sounds clear to auscultation       Cardiovascular hypertension, negative cardio ROS Normal cardiovascular exam Rhythm:regular Rate:Normal     Neuro/Psych negative neurological ROS     GI/Hepatic negative GI ROS, Neg liver ROS,   Endo/Other  negative endocrine ROS  Renal/GU negative Renal ROS     Musculoskeletal   Abdominal   Peds  Hematology negative hematology ROS (+)   Anesthesia Other Findings   Reproductive/Obstetrics negative OB ROS                             Anesthesia Physical Anesthesia Plan  ASA: III and emergent  Anesthesia Plan: General   Post-op Pain Management:    Induction: Intravenous  Airway Management Planned: Oral ETT  Additional Equipment: Arterial line  Intra-op Plan:   Post-operative Plan: Post-operative intubation/ventilation  Informed Consent: I have reviewed the patients History and Physical, chart, labs and discussed the procedure including the risks, benefits and alternatives for the proposed anesthesia with the patient or authorized representative who has indicated his/her understanding and acceptance.   Dental Advisory Given  Plan Discussed with: Anesthesiologist, CRNA and Surgeon  Anesthesia Plan Comments: (Code stroke)        Anesthesia Quick Evaluation

## 2015-07-19 NOTE — ED Provider Notes (Signed)
CSN: [id number]     Arrival date & time 07/19/15  2114 History   First MD Initiated Contact with Patient 07/19/15 2122     Chief Complaint  Patient presents with  . Code Stroke   HPI  82 y.o. female with history of hypertension and atrial fibrillation, not on anticoagulation, who comes in as a code stroke that was called in the field. Patient was found by EMS in her car at the end of her driveway unresponsive. Patient noted to have right sided facial droop, right-sided weakness, and left-sided gaze. Last known normal unknown. Unable to obtain remainder of history given altered mental status. Level V caveat for altered mental status.   Past Medical History  Diagnosis Date  . Hypertension   . Cancer ([place])   . Asthma    Past Surgical History  Procedure Laterality Date  . Appendectomy    . Abdominal hysterectomy      PARTIAL  . Cervical spine surgery    . Myomectomy    . Breast surgery    . Ectopic pregnancy surgery    . Neck surgery      c-spine   Family History  Problem Relation Age of Onset  . Hypertension Mother   . Cancer Sister     BREAST   Social History  Substance Use Topics  . Smoking status: Never Smoker   . Smokeless tobacco: Never Used  . Alcohol Use: No   OB History    No data available     Review of Systems  Unable to perform ROS: Mental status change      Allergies  Penicillins; Aspirin; Morphine; and Sulfur  Home Medications   Prior to Admission medications   Medication Sig Start Date End Date Taking? Authorizing Provider  carboxymethylcellulose (REFRESH PLUS) 0.5 % SOLN Place 1 drop into both eyes 3 (three) times daily as needed (for dry eyes).   Yes Historical Provider, MD  Cholecalciferol (VITAMIN D3) 1000 UNITS CAPS Take 1 capsule by mouth daily.    Yes Historical Provider, MD  diltiazem (TIAZAC) 180 MG 24 hr capsule Take 180 mg by mouth daily.     Yes Historical Provider, MD  ergocalciferol (DRISDOL) 8000 UNIT/ML drops Take 5,000 Units by  mouth daily.   Yes Historical Provider, MD  Garlic 123XX123 MG CAPS Take 1,000 mg by mouth daily.   Yes Historical Provider, MD  Ginger, Zingiber officinalis, (GINGER EXTRACT) 250 MG CAPS Take 250 mg by mouth daily.   Yes Historical Provider, MD  hydrALAZINE (APRESOLINE) 25 MG tablet Take 25 mg by mouth 2 (two) times daily as needed.    Yes Historical Provider, MD  hydrochlorothiazide (HYDRODIURIL) 25 MG tablet Take 25 mg by mouth daily.   Yes Historical Provider, MD  hydroxypropyl methylcellulose / hypromellose (ISOPTO TEARS / GONIOVISC) 2.5 % ophthalmic solution Place 1 drop into both eyes as needed for dry eyes.   Yes Historical Provider, MD  lisinopril (PRINIVIL,ZESTRIL) 20 MG tablet Take 20 mg by mouth daily.   Yes Historical Provider, MD  magnesium gluconate (MAGONATE) 500 MG tablet Take 500 mg by mouth daily.   Yes Historical Provider, MD  metoprolol (TOPROL-XL) 100 MG 24 hr tablet Take 100 mg by mouth daily.     Yes Historical Provider, MD  Multiple Vitamin (MULTIVITAMIN WITH MINERALS) TABS tablet Take 1 tablet by mouth daily.   Yes Historical Provider, MD  Multiple Vitamins-Minerals (ALIVE WOMENS ENERGY) TABS Take 1 tablet by mouth daily.   Yes Historical Provider,  MD  potassium chloride SA (K-DUR,KLOR-CON) 20 MEQ tablet Take 20 mEq by mouth 2 (two) times daily.     Yes Historical Provider, MD  vitamin C (ASCORBIC ACID) 500 MG tablet Take 500 mg by mouth daily.     Yes Historical Provider, MD  Vitamin D, Ergocalciferol, (DRISDOL) 50000 units CAPS capsule Take 50,000 Units by mouth every 7 (seven) days.   Yes Historical Provider, MD   BP 141/83 mmHg  Pulse 61  Resp 19  Wt 70.3 kg  SpO2 97% Physical Exam  Constitutional: She appears well-developed and well-nourished.  HENT:  Head: Normocephalic and atraumatic.  Right Ear: External ear normal.  Left Ear: External ear normal.  Right-sided facial droop  Eyes: Pupils are equal, round, and reactive to light.  L-sided facial droop  Neck: No  tracheal deviation present.  Cardiovascular: Normal rate and normal heart sounds.  Exam reveals no gallop and no friction rub.   No murmur heard. Irregularly irregular rhythm  Pulmonary/Chest: Breath sounds normal. No respiratory distress. She has no wheezes. She has no rales.  Abdominal: Soft. Bowel sounds are normal. She exhibits no distension.  Musculoskeletal:  Grossly atraumatic  Neurological: She exhibits normal muscle tone.  Patient is unable to cooperate with full neurologic exam as she doesn't follow commands. Right-sided facial droop noted as well as leftward gaze. She withdraws in the left upper and left lower extremity to painful stimuli but not in the right upper and right lower extremity.  Skin: Skin is warm and dry. No rash noted.    ED Course  Procedures (including critical care time) Labs Review Labs Reviewed  PROTIME-INR - Abnormal; Notable for the following:    Prothrombin Time 15.3 (*)    All other components within normal limits  CBC - Abnormal; Notable for the following:    Platelets 138 (*)    All other components within normal limits  COMPREHENSIVE METABOLIC PANEL - Abnormal; Notable for the following:    Glucose, Bld 140 (*)    BUN 21 (*)    All other components within normal limits  I-STAT CHEM 8, ED - Abnormal; Notable for the following:    BUN 26 (*)    Glucose, Bld 134 (*)    Calcium, Ion 1.05 (*)    Hemoglobin 16.0 (*)    HCT 47.0 (*)    All other components within normal limits  CBG MONITORING, ED - Abnormal; Notable for the following:    Glucose-Capillary 115 (*)    All other components within normal limits  ETHANOL  APTT  DIFFERENTIAL  URINE RAPID DRUG SCREEN, HOSP PERFORMED  URINALYSIS, ROUTINE W REFLEX MICROSCOPIC (NOT AT [facility])  I-STAT TROPOININ, ED    Imaging Review Ct Angio Head W/cm &/or Wo Cm  07/19/2015  CLINICAL DATA:  82 year old female code stroke patient with right side weakness. Initial encounter. EXAM: CT ANGIOGRAPHY HEAD  AND NECK CT CEREBRAL PERFUSION WITH CONTRAST TECHNIQUE: Multidetector CT imaging of the head and neck was performed using the standard protocol during bolus administration of intravenous contrast. Multiplanar CT image reconstructions and MIPs were obtained to evaluate the vascular anatomy. Carotid stenosis measurements (when applicable) are obtained utilizing NASCET criteria, using the distal internal carotid diameter as the denominator. Cerebral CT perfusion was also performed and images were post processed into perfusion maps. CONTRAST:  61mL OMNIPAQUE IOHEXOL 350 MG/ML SOLN COMPARISON:  Noncontrast head CT 2133 hours today. FINDINGS: CT PERFUSION Left MCA territory abnormal mean-transit-time and time-to-peak with associated decreased cerebral blood  flow. Cerebral blood volume is relatively preserved, suggesting compensated ischemia and penumbra. On CT profusion source images delayed collateral enhancement in the left MCA territory occurs. Furthermore, on the comparison noncontrast CT (reported separately) I estimate the ASPECTS score at 9 or 10, with little to no CT changes of left MCA ischemia. CTA NECK Skeleton: Osteopenia. No acute osseous abnormality identified. Other neck: Patchy ground-glass opacity with pulmonary septal thickening in the visualized upper lungs. No superior mediastinal lymphadenopathy. Negative thyroid, larynx, pharynx, parapharyngeal spaces, retropharyngeal space, sublingual space, submandibular glands and parotid glands. No cervical lymphadenopathy. Aortic arch: Slight bovine type arch configuration. Minimal arch atherosclerosis. No great vessel origin stenosis. Right carotid system: Tortuous right CCA in the lower neck. Minimal for age atherosclerosis at the right carotid bifurcation. Tortuous cervical right ICA, otherwise negative to the skullbase. Left carotid system: Minimal plaque at the left CCA origin. Tortuous proximal left CCA. Minimal plaque at the left carotid bifurcation which  is widely patent. Tortuous cervical left ICA but otherwise negative to the skullbase. See intracranial findings below. Vertebral arteries:No proximal subclavian artery stenosis. Normal vertebral artery origins. Tortuous bilateral V1 segments. The right vertebral artery is mildly dominant. No vertebral artery stenosis to the skullbase. CTA HEAD Posterior circulation: Non dominant left vertebral artery is diminutive beyond the left PICA origin. Normal right PICA origin. Mild calcified and soft plaque in the distal right V4 segment resulting in mild stenosis. Patent vertebrobasilar junction. Moderate proximal basilar artery irregularity and stenosis. Irregularity continues to the mid basilar artery. The distal basilar artery has a more normal appearance. Normal SCA and right PCA origins. Fetal type left PCA origin with enhancing left posterior communicating artery. The right posterior communicating artery is diminutive or absent. Bilateral PCA branches are irregular but patent. Anterior circulation: Patent right ICA siphon with calcified plaque. Mild right siphon stenosis. Patent right ICA terminus. Right MCA M1 segment is patent with mild to moderate irregularity. Right MCA bifurcation is patent with moderate irregularity which continues into the right MCA branches. Proximal left ICA siphon is patent but there is thrombus at the left ICA terminus extending from the level of the posterior communicating artery to the left MCA and ACA origins. Thrombus in both left M1 and A1 segments with reconstituted flow. Furthermore, the left A1 segment appears dominant. There is reconstituted flow at the anterior communicating artery and in the bilateral ACA branches which are irregular but patent. There is poor to intermediate degree of early reconstituted flow at the left MCA bifurcation. Attenuated left MCA M2 branches. Venous sinuses: Appear patent, but suboptimal venous contrast timing. Anatomic variants: Suspected dominant  left ACA A1 segment. IMPRESSION: 1. Positive for emergent large vessel occlusion at the left ICA terminus. Thrombus from the level of the left posterior communicating artery extending into both the left M1 and A1 segments. Some early reconstituted left MCA and ACA enhancement. 2. Large area of abnormal left MCA perfusion characteristics, but with relatively preserved cerebral blood volume and delayed collateral enhancement on the perfusion source images. 3. In conjunction with the noncontrast head CT (reported separately) the above constellation is favorable for endovascular reperfusion. This was discussed by telephone with Dr. Wallie Char on 07/19/2015 at 2156 hours. 4. No superimposed arterial stenosis in the neck. Tortuous bilateral carotid arteries. 5. Superimposed intracranial atherosclerosis elsewhere including moderate proximal basilar stenosis, mild stenosis of the dominant distal right vertebral artery, and mild to moderate bilateral PCA and right MCA branch irregularity. 6. Bilateral upper lobe pulmonary ground-glass opacity and  septal thickening may indicate developing interstitial edema. Electronically Signed   By: Genevie Ann M.D.   On: 07/19/2015 22:31   Ct Head Wo Contrast  07/19/2015  CLINICAL DATA:  82 year old female found out site. Right-sided weakness. Code stroke. EXAM: CT HEAD WITHOUT CONTRAST TECHNIQUE: Contiguous axial images were obtained from the base of the skull through the vertex without intravenous contrast. COMPARISON:  None FINDINGS: There is mild prominence of the ventricles and sulci compatible with age-related atrophy. Moderate periventricular and deep white matter chronic microvascular ischemic changes noted. There is no acute intracranial hemorrhage. No mass effect or midline shift noted. Stop the visualized paranasal sinuses and mastoid air cells are clear. The calvarium is intact. IMPRESSION: No acute intracranial hemorrhage. Age-related atrophy and chronic microvascular  ischemic disease. If symptoms persist and there are no contraindications, MRI may provide better evaluation if clinically indicated. Electronically Signed   By: Anner Crete M.D.   On: 07/19/2015 21:56   Ct Angio Neck W/cm &/or Wo/cm  07/19/2015  CLINICAL DATA:  82 year old female code stroke patient with right side weakness. Initial encounter. EXAM: CT ANGIOGRAPHY HEAD AND NECK CT CEREBRAL PERFUSION WITH CONTRAST TECHNIQUE: Multidetector CT imaging of the head and neck was performed using the standard protocol during bolus administration of intravenous contrast. Multiplanar CT image reconstructions and MIPs were obtained to evaluate the vascular anatomy. Carotid stenosis measurements (when applicable) are obtained utilizing NASCET criteria, using the distal internal carotid diameter as the denominator. Cerebral CT perfusion was also performed and images were post processed into perfusion maps. CONTRAST:  14mL OMNIPAQUE IOHEXOL 350 MG/ML SOLN COMPARISON:  Noncontrast head CT 2133 hours today. FINDINGS: CT PERFUSION Left MCA territory abnormal mean-transit-time and time-to-peak with associated decreased cerebral blood flow. Cerebral blood volume is relatively preserved, suggesting compensated ischemia and penumbra. On CT profusion source images delayed collateral enhancement in the left MCA territory occurs. Furthermore, on the comparison noncontrast CT (reported separately) I estimate the ASPECTS score at 9 or 10, with little to no CT changes of left MCA ischemia. CTA NECK Skeleton: Osteopenia. No acute osseous abnormality identified. Other neck: Patchy ground-glass opacity with pulmonary septal thickening in the visualized upper lungs. No superior mediastinal lymphadenopathy. Negative thyroid, larynx, pharynx, parapharyngeal spaces, retropharyngeal space, sublingual space, submandibular glands and parotid glands. No cervical lymphadenopathy. Aortic arch: Slight bovine type arch configuration. Minimal arch  atherosclerosis. No great vessel origin stenosis. Right carotid system: Tortuous right CCA in the lower neck. Minimal for age atherosclerosis at the right carotid bifurcation. Tortuous cervical right ICA, otherwise negative to the skullbase. Left carotid system: Minimal plaque at the left CCA origin. Tortuous proximal left CCA. Minimal plaque at the left carotid bifurcation which is widely patent. Tortuous cervical left ICA but otherwise negative to the skullbase. See intracranial findings below. Vertebral arteries:No proximal subclavian artery stenosis. Normal vertebral artery origins. Tortuous bilateral V1 segments. The right vertebral artery is mildly dominant. No vertebral artery stenosis to the skullbase. CTA HEAD Posterior circulation: Non dominant left vertebral artery is diminutive beyond the left PICA origin. Normal right PICA origin. Mild calcified and soft plaque in the distal right V4 segment resulting in mild stenosis. Patent vertebrobasilar junction. Moderate proximal basilar artery irregularity and stenosis. Irregularity continues to the mid basilar artery. The distal basilar artery has a more normal appearance. Normal SCA and right PCA origins. Fetal type left PCA origin with enhancing left posterior communicating artery. The right posterior communicating artery is diminutive or absent. Bilateral PCA branches are irregular but  patent. Anterior circulation: Patent right ICA siphon with calcified plaque. Mild right siphon stenosis. Patent right ICA terminus. Right MCA M1 segment is patent with mild to moderate irregularity. Right MCA bifurcation is patent with moderate irregularity which continues into the right MCA branches. Proximal left ICA siphon is patent but there is thrombus at the left ICA terminus extending from the level of the posterior communicating artery to the left MCA and ACA origins. Thrombus in both left M1 and A1 segments with reconstituted flow. Furthermore, the left A1 segment  appears dominant. There is reconstituted flow at the anterior communicating artery and in the bilateral ACA branches which are irregular but patent. There is poor to intermediate degree of early reconstituted flow at the left MCA bifurcation. Attenuated left MCA M2 branches. Venous sinuses: Appear patent, but suboptimal venous contrast timing. Anatomic variants: Suspected dominant left ACA A1 segment. IMPRESSION: 1. Positive for emergent large vessel occlusion at the left ICA terminus. Thrombus from the level of the left posterior communicating artery extending into both the left M1 and A1 segments. Some early reconstituted left MCA and ACA enhancement. 2. Large area of abnormal left MCA perfusion characteristics, but with relatively preserved cerebral blood volume and delayed collateral enhancement on the perfusion source images. 3. In conjunction with the noncontrast head CT (reported separately) the above constellation is favorable for endovascular reperfusion. This was discussed by telephone with Dr. Wallie Char on 07/19/2015 at 2156 hours. 4. No superimposed arterial stenosis in the neck. Tortuous bilateral carotid arteries. 5. Superimposed intracranial atherosclerosis elsewhere including moderate proximal basilar stenosis, mild stenosis of the dominant distal right vertebral artery, and mild to moderate bilateral PCA and right MCA branch irregularity. 6. Bilateral upper lobe pulmonary ground-glass opacity and septal thickening may indicate developing interstitial edema. Electronically Signed   By: Genevie Ann M.D.   On: 07/19/2015 22:31   Ct Cerebral Perfusion W/cm  07/19/2015  CLINICAL DATA:  82 year old female code stroke patient with right side weakness. Initial encounter. EXAM: CT ANGIOGRAPHY HEAD AND NECK CT CEREBRAL PERFUSION WITH CONTRAST TECHNIQUE: Multidetector CT imaging of the head and neck was performed using the standard protocol during bolus administration of intravenous contrast. Multiplanar  CT image reconstructions and MIPs were obtained to evaluate the vascular anatomy. Carotid stenosis measurements (when applicable) are obtained utilizing NASCET criteria, using the distal internal carotid diameter as the denominator. Cerebral CT perfusion was also performed and images were post processed into perfusion maps. CONTRAST:  47mL OMNIPAQUE IOHEXOL 350 MG/ML SOLN COMPARISON:  Noncontrast head CT 2133 hours today. FINDINGS: CT PERFUSION Left MCA territory abnormal mean-transit-time and time-to-peak with associated decreased cerebral blood flow. Cerebral blood volume is relatively preserved, suggesting compensated ischemia and penumbra. On CT profusion source images delayed collateral enhancement in the left MCA territory occurs. Furthermore, on the comparison noncontrast CT (reported separately) I estimate the ASPECTS score at 9 or 10, with little to no CT changes of left MCA ischemia. CTA NECK Skeleton: Osteopenia. No acute osseous abnormality identified. Other neck: Patchy ground-glass opacity with pulmonary septal thickening in the visualized upper lungs. No superior mediastinal lymphadenopathy. Negative thyroid, larynx, pharynx, parapharyngeal spaces, retropharyngeal space, sublingual space, submandibular glands and parotid glands. No cervical lymphadenopathy. Aortic arch: Slight bovine type arch configuration. Minimal arch atherosclerosis. No great vessel origin stenosis. Right carotid system: Tortuous right CCA in the lower neck. Minimal for age atherosclerosis at the right carotid bifurcation. Tortuous cervical right ICA, otherwise negative to the skullbase. Left carotid system: Minimal plaque at the  left CCA origin. Tortuous proximal left CCA. Minimal plaque at the left carotid bifurcation which is widely patent. Tortuous cervical left ICA but otherwise negative to the skullbase. See intracranial findings below. Vertebral arteries:No proximal subclavian artery stenosis. Normal vertebral artery  origins. Tortuous bilateral V1 segments. The right vertebral artery is mildly dominant. No vertebral artery stenosis to the skullbase. CTA HEAD Posterior circulation: Non dominant left vertebral artery is diminutive beyond the left PICA origin. Normal right PICA origin. Mild calcified and soft plaque in the distal right V4 segment resulting in mild stenosis. Patent vertebrobasilar junction. Moderate proximal basilar artery irregularity and stenosis. Irregularity continues to the mid basilar artery. The distal basilar artery has a more normal appearance. Normal SCA and right PCA origins. Fetal type left PCA origin with enhancing left posterior communicating artery. The right posterior communicating artery is diminutive or absent. Bilateral PCA branches are irregular but patent. Anterior circulation: Patent right ICA siphon with calcified plaque. Mild right siphon stenosis. Patent right ICA terminus. Right MCA M1 segment is patent with mild to moderate irregularity. Right MCA bifurcation is patent with moderate irregularity which continues into the right MCA branches. Proximal left ICA siphon is patent but there is thrombus at the left ICA terminus extending from the level of the posterior communicating artery to the left MCA and ACA origins. Thrombus in both left M1 and A1 segments with reconstituted flow. Furthermore, the left A1 segment appears dominant. There is reconstituted flow at the anterior communicating artery and in the bilateral ACA branches which are irregular but patent. There is poor to intermediate degree of early reconstituted flow at the left MCA bifurcation. Attenuated left MCA M2 branches. Venous sinuses: Appear patent, but suboptimal venous contrast timing. Anatomic variants: Suspected dominant left ACA A1 segment. IMPRESSION: 1. Positive for emergent large vessel occlusion at the left ICA terminus. Thrombus from the level of the left posterior communicating artery extending into both the left M1  and A1 segments. Some early reconstituted left MCA and ACA enhancement. 2. Large area of abnormal left MCA perfusion characteristics, but with relatively preserved cerebral blood volume and delayed collateral enhancement on the perfusion source images. 3. In conjunction with the noncontrast head CT (reported separately) the above constellation is favorable for endovascular reperfusion. This was discussed by telephone with Dr. Wallie Char on 07/19/2015 at 2156 hours. 4. No superimposed arterial stenosis in the neck. Tortuous bilateral carotid arteries. 5. Superimposed intracranial atherosclerosis elsewhere including moderate proximal basilar stenosis, mild stenosis of the dominant distal right vertebral artery, and mild to moderate bilateral PCA and right MCA branch irregularity. 6. Bilateral upper lobe pulmonary ground-glass opacity and septal thickening may indicate developing interstitial edema. Electronically Signed   By: Genevie Ann M.D.   On: 07/19/2015 22:31   I have personally reviewed and evaluated these images and lab results as part of my medical decision-making.   EKG Interpretation   Date/Time:  Saturday July 19 2015 22:01:35 EDT Ventricular Rate:  60 PR Interval:    QRS Duration: 110 QT Interval:  483 QTC Calculation: 483 R Axis:   -20 Text Interpretation:  Atrial fibrillation LVH with secondary  repolarization abnormality Anterior Q waves, possibly due to LVH Confirmed  by Winfred Leeds  MD, SAM [phone]) on 07/19/2015 10:13:38 PM      MDM   Final diagnoses:  Acute ischemic stroke ([place])    Ability to obtain detailed neurologic exam limited by patient's inability to follow commands. She continues to protect her airway. CT scans reveal  a left ICA ischemic stroke. Neurology evaluated the patient on arrival, and will be admitting her to the ICU for management, to include tPA. EKG reveals the patient's known atrial fibrillation. She is rate controlled. Remainder of labs unremarkable. She  is stable for transfer to the ICU.   Cuma Polyakov Algernon Huxley, MD 07/19/15 2349  Orlie Dakin, MD 07/20/15 [id number]
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
## 2015-07-19 NOTE — Anesthesia Procedure Notes (Signed)
Procedure Name: Intubation Date/Time: 07/19/2015 11:06 PM Performed by: Kaliopi Blyden S Pre-anesthesia Checklist: Patient identified, Emergency Drugs available, Suction available, Patient being monitored and Timeout performed Patient Re-evaluated:Patient Re-evaluated prior to inductionOxygen Delivery Method: Circle system utilized Preoxygenation: Pre-oxygenation with 100% oxygen Intubation Type: IV induction Laryngoscope Size: Mac and 3 Grade View: Grade II Tube type: Subglottic suction tube Tube size: 7.5 mm Number of attempts: 1 Airway Equipment and Method: Stylet Placement Confirmation: ETT inserted through vocal cords under direct vision,  positive ETCO2 and breath sounds checked- equal and bilateral Secured at: 23 cm Tube secured with: Tape Dental Injury: Teeth and Oropharynx as per pre-operative assessment

## 2015-07-19 NOTE — ED Notes (Signed)
Report attempted 

## 2015-07-19 NOTE — Code Documentation (Signed)
Joy Holden is a 82yo bf presenting to [facility] via [facility] after being found unresponsive in her car near her home.  She was found to have a Lt gaze & Rt side flaccid.  A code stroke was called though initially the LKW was unknown.  Initial assessment showed Rt side flaccid, Lt gaze preference, aphasia,and sensory deficit NIH 27.  After lengthy investigation a LKW was determined to be 2015.  She was found dressed to go to a performance at a local [employer] and had the program for the performance in her purse.  Her nephew was able to add that he had driven by her house at [place] and she was not there and her car was discovered at 20 by a passerby.  CTA head/neck/perfusion were completed prior to the decision to give tPA.  tPA started at 2218 with a door to needle of 57min.  HTN required tx prior to tPA starting.

## 2015-07-19 NOTE — H&P (Signed)
Admission H&P    Chief Complaint: New onset speech difficulty, facial droop and right hemiplegia.  HPI: Joy Holden is an 82 y.o. female history of atrial fibrillation and hypertension brought to the emergency room following acute onset of speech abnormality, right facial droop and right hemiplegia. Patient was driving at the time of onset of her deficits. She was last known well at about 8:15 PM on 07/19/2015. She has no previous of stroke nor TIA. She has not been on anticoagulation or antiplatelet therapy. She reportedly is allergic to aspirin. CT scan of her head showed no acute intracranial abnormality. CT angiogram however showed occlusion of left ICA terminus with thrombus extending from the level of posterior indicating artery both M1 and A1 segments. Patient was treated with venous TPA with no change in deficits. Her NIH stroke score was 26. She was subsequently taken to interventional radiology suite for further management. Blood pressure was elevated, requiring intervention with hydralazine IV.  LSN: 8:15 PM on 07/19/2015 tPA Given: Yes mRankin:  Past Medical History  Diagnosis Date  . Hypertension   . Cancer ([place])   . Asthma     Past Surgical History  Procedure Laterality Date  . Appendectomy    . Abdominal hysterectomy      PARTIAL  . Cervical spine surgery    . Myomectomy    . Breast surgery    . Ectopic pregnancy surgery    . Neck surgery      c-spine    Family History  Problem Relation Age of Onset  . Hypertension Mother   . Cancer Sister     BREAST   Social History:  reports that she has never smoked. She has never used smokeless tobacco. She reports that she does not drink alcohol or use illicit drugs.  Allergies:  Allergies  Allergen Reactions  . Penicillins Rash  . Aspirin     Burning in stomach  . Morphine     Other reaction(s): Delusions (intolerance)  . Sulfur Nausea Only    Medications: Patient's preadmission medications were reviewed by  me.  ROS: Unavailable due to patient's mental status and aphasia.  Physical Examination: Blood pressure 141/83, pulse 61, resp. rate 19, weight 70.3 kg (154 lb 15.7 oz), SpO2 97 %.  HEENT-  Normocephalic, no lesions, without obvious abnormality.  Normal external eye and conjunctiva.  Normal TM's bilaterally.  Normal auditory canals and external ears. Normal external nose, mucus membranes and septum.  Normal pharynx. Neck supple with no masses, nodes, nodules or enlargement. Cardiovascular - bradycardic in the 50s, normal S1 and S2, no S3-S4 Lungs - chest clear, no wheezing, rales, normal symmetric air entry Abdomen - soft, non-tender; bowel sounds normal; no masses,  no organomegaly Extremities - no joint deformities, effusion, or inflammation and no edema  Neurologic Examination: Patient was obtunded and responsive only to noxious stimuli to left extremities. Respiratory pattern was normal. She did not follow any verbal commands. Pupils were equal and reacted normally to light. Gaze was leftward with no movement conjugately beyond midline toward the right. Moderate right lower facial weakness was noted. Motor exam showed flaccid right hemiplegia. He had spontaneous movements of left extremities with normal strength and muscle tone. Deep tendon reflexes were 2+ and symmetrical. Right plantar response was extensor and left response was flexor. Carotid auscultation was normal.  Results for orders placed or performed during the hospital encounter of 07/19/15 (from the past 48 hour(s))  Ethanol     Status: None  Collection Time: 07/19/15  9:21 PM  Result Value Ref Range   Alcohol, Ethyl (B) <5 <5 mg/dL    Comment:        LOWEST DETECTABLE LIMIT FOR SERUM ALCOHOL IS 5 mg/dL FOR MEDICAL PURPOSES ONLY   Protime-INR     Status: Abnormal   Collection Time: 07/19/15  9:21 PM  Result Value Ref Range   Prothrombin Time 15.3 (H) 11.6 - 15.2 seconds   INR 1.20 0.00 - 1.49  APTT     Status:  None   Collection Time: 07/19/15  9:21 PM  Result Value Ref Range   aPTT 31 24 - 37 seconds  CBC     Status: Abnormal   Collection Time: 07/19/15  9:21 PM  Result Value Ref Range   WBC 5.9 4.0 - 10.5 K/uL   RBC 4.64 3.87 - 5.11 MIL/uL   Hemoglobin 14.4 12.0 - 15.0 g/dL   HCT 44.0 36.0 - 46.0 %   MCV 94.8 78.0 - 100.0 fL   MCH 31.0 26.0 - 34.0 pg   MCHC 32.7 30.0 - 36.0 g/dL   RDW 12.7 11.5 - 15.5 %   Platelets 138 (L) 150 - 400 K/uL    Comment: SPECIMEN CHECKED FOR CLOTS REPEATED TO VERIFY PLATELET COUNT CONFIRMED BY SMEAR   Differential     Status: None   Collection Time: 07/19/15  9:21 PM  Result Value Ref Range   Neutrophils Relative % 46 %   Neutro Abs 2.7 1.7 - 7.7 K/uL   Lymphocytes Relative 44 %   Lymphs Abs 2.6 0.7 - 4.0 K/uL   Monocytes Relative 8 %   Monocytes Absolute 0.5 0.1 - 1.0 K/uL   Eosinophils Relative 2 %   Eosinophils Absolute 0.1 0.0 - 0.7 K/uL   Basophils Relative 1 %   Basophils Absolute 0.0 0.0 - 0.1 K/uL  Comprehensive metabolic panel     Status: Abnormal   Collection Time: 07/19/15  9:21 PM  Result Value Ref Range   Sodium 141 135 - 145 mmol/L   Potassium 3.8 3.5 - 5.1 mmol/L   Chloride 108 101 - 111 mmol/L   CO2 23 22 - 32 mmol/L   Glucose, Bld 140 (H) 65 - 99 mg/dL   BUN 21 (H) 6 - 20 mg/dL   Creatinine, Ser 0.83 0.44 - 1.00 mg/dL   Calcium 9.3 8.9 - 10.3 mg/dL   Total Protein 7.2 6.5 - 8.1 g/dL   Albumin 4.2 3.5 - 5.0 g/dL   AST 25 15 - 41 U/L   ALT 26 14 - 54 U/L   Alkaline Phosphatase 107 38 - 126 U/L   Total Bilirubin 0.7 0.3 - 1.2 mg/dL   GFR calc non Af Amer >60 >60 mL/min   GFR calc Af Amer >60 >60 mL/min    Comment: (NOTE) The eGFR has been calculated using the CKD EPI equation. This calculation has not been validated in all clinical situations. eGFR's persistently <60 mL/min signify possible Chronic Kidney Disease.    Anion gap 10 5 - 15  I-Stat Chem 8, ED  (not at [facility], [facility])     Status: Abnormal   Collection Time:  07/19/15  9:26 PM  Result Value Ref Range   Sodium 143 135 - 145 mmol/L   Potassium 3.7 3.5 - 5.1 mmol/L   Chloride 106 101 - 111 mmol/L   BUN 26 (H) 6 - 20 mg/dL   Creatinine, Ser 0.80 0.44 - 1.00 mg/dL   Glucose, Bld  134 (H) 65 - 99 mg/dL   Calcium, Ion 1.05 (L) 1.13 - 1.30 mmol/L   TCO2 24 0 - 100 mmol/L   Hemoglobin 16.0 (H) 12.0 - 15.0 g/dL   HCT 47.0 (H) 36.0 - 46.0 %  I-stat troponin, ED (not at [facility], [facility])     Status: None   Collection Time: 07/19/15  9:34 PM  Result Value Ref Range   Troponin i, poc 0.00 0.00 - 0.08 ng/mL   Comment 3            Comment: Due to the release kinetics of cTnI, a negative result within the first hours of the onset of symptoms does not rule out myocardial infarction with certainty. If myocardial infarction is still suspected, repeat the test at appropriate intervals.   CBG monitoring, ED     Status: Abnormal   Collection Time: 07/19/15 10:05 PM  Result Value Ref Range   Glucose-Capillary 115 (H) 65 - 99 mg/dL  Urine rapid drug screen (hosp performed)not at [facility]     Status: None   Collection Time: 07/19/15 10:33 PM  Result Value Ref Range   Opiates NONE DETECTED NONE DETECTED   Cocaine NONE DETECTED NONE DETECTED   Benzodiazepines NONE DETECTED NONE DETECTED   Amphetamines NONE DETECTED NONE DETECTED   Tetrahydrocannabinol NONE DETECTED NONE DETECTED   Barbiturates NONE DETECTED NONE DETECTED    Comment:        DRUG SCREEN FOR MEDICAL PURPOSES ONLY.  IF CONFIRMATION IS NEEDED FOR ANY PURPOSE, NOTIFY LAB WITHIN 5 DAYS.        LOWEST DETECTABLE LIMITS FOR URINE DRUG SCREEN Drug Class       Cutoff (ng/mL) Amphetamine      1000 Barbiturate      200 Benzodiazepine   166 Tricyclics       063 Opiates          300 Cocaine          300 THC              50   Urinalysis, Routine w reflex microscopic (not at [facility])     Status: None   Collection Time: 07/19/15 10:34 PM  Result Value Ref Range   Color, Urine YELLOW YELLOW   APPearance  CLEAR CLEAR   Specific Gravity, Urine 1.023 1.005 - 1.030   pH 6.5 5.0 - 8.0   Glucose, UA NEGATIVE NEGATIVE mg/dL   Hgb urine dipstick NEGATIVE NEGATIVE   Bilirubin Urine NEGATIVE NEGATIVE   Ketones, ur NEGATIVE NEGATIVE mg/dL   Protein, ur NEGATIVE NEGATIVE mg/dL   Nitrite NEGATIVE NEGATIVE   Leukocytes, UA NEGATIVE NEGATIVE    Comment: MICROSCOPIC NOT DONE ON URINES WITH NEGATIVE PROTEIN, BLOOD, LEUKOCYTES, NITRITE, OR GLUCOSE <1000 mg/dL.   Ct Angio Head W/cm &/or Wo Cm  07/19/2015  CLINICAL DATA:  82 year old female code stroke patient with right side weakness. Initial encounter. EXAM: CT ANGIOGRAPHY HEAD AND NECK CT CEREBRAL PERFUSION WITH CONTRAST TECHNIQUE: Multidetector CT imaging of the head and neck was performed using the standard protocol during bolus administration of intravenous contrast. Multiplanar CT image reconstructions and MIPs were obtained to evaluate the vascular anatomy. Carotid stenosis measurements (when applicable) are obtained utilizing NASCET criteria, using the distal internal carotid diameter as the denominator. Cerebral CT perfusion was also performed and images were post processed into perfusion maps. CONTRAST:  77m OMNIPAQUE IOHEXOL 350 MG/ML SOLN COMPARISON:  Noncontrast head CT 2133 hours today. FINDINGS: CT PERFUSION Left MCA territory abnormal mean-transit-time  and time-to-peak with associated decreased cerebral blood flow. Cerebral blood volume is relatively preserved, suggesting compensated ischemia and penumbra. On CT profusion source images delayed collateral enhancement in the left MCA territory occurs. Furthermore, on the comparison noncontrast CT (reported separately) I estimate the ASPECTS score at 9 or 10, with little to no CT changes of left MCA ischemia. CTA NECK Skeleton: Osteopenia. No acute osseous abnormality identified. Other neck: Patchy ground-glass opacity with pulmonary septal thickening in the visualized upper lungs. No superior mediastinal  lymphadenopathy. Negative thyroid, larynx, pharynx, parapharyngeal spaces, retropharyngeal space, sublingual space, submandibular glands and parotid glands. No cervical lymphadenopathy. Aortic arch: Slight bovine type arch configuration. Minimal arch atherosclerosis. No great vessel origin stenosis. Right carotid system: Tortuous right CCA in the lower neck. Minimal for age atherosclerosis at the right carotid bifurcation. Tortuous cervical right ICA, otherwise negative to the skullbase. Left carotid system: Minimal plaque at the left CCA origin. Tortuous proximal left CCA. Minimal plaque at the left carotid bifurcation which is widely patent. Tortuous cervical left ICA but otherwise negative to the skullbase. See intracranial findings below. Vertebral arteries:No proximal subclavian artery stenosis. Normal vertebral artery origins. Tortuous bilateral V1 segments. The right vertebral artery is mildly dominant. No vertebral artery stenosis to the skullbase. CTA HEAD Posterior circulation: Non dominant left vertebral artery is diminutive beyond the left PICA origin. Normal right PICA origin. Mild calcified and soft plaque in the distal right V4 segment resulting in mild stenosis. Patent vertebrobasilar junction. Moderate proximal basilar artery irregularity and stenosis. Irregularity continues to the mid basilar artery. The distal basilar artery has a more normal appearance. Normal SCA and right PCA origins. Fetal type left PCA origin with enhancing left posterior communicating artery. The right posterior communicating artery is diminutive or absent. Bilateral PCA branches are irregular but patent. Anterior circulation: Patent right ICA siphon with calcified plaque. Mild right siphon stenosis. Patent right ICA terminus. Right MCA M1 segment is patent with mild to moderate irregularity. Right MCA bifurcation is patent with moderate irregularity which continues into the right MCA branches. Proximal left ICA siphon is  patent but there is thrombus at the left ICA terminus extending from the level of the posterior communicating artery to the left MCA and ACA origins. Thrombus in both left M1 and A1 segments with reconstituted flow. Furthermore, the left A1 segment appears dominant. There is reconstituted flow at the anterior communicating artery and in the bilateral ACA branches which are irregular but patent. There is poor to intermediate degree of early reconstituted flow at the left MCA bifurcation. Attenuated left MCA M2 branches. Venous sinuses: Appear patent, but suboptimal venous contrast timing. Anatomic variants: Suspected dominant left ACA A1 segment. IMPRESSION: 1. Positive for emergent large vessel occlusion at the left ICA terminus. Thrombus from the level of the left posterior communicating artery extending into both the left M1 and A1 segments. Some early reconstituted left MCA and ACA enhancement. 2. Large area of abnormal left MCA perfusion characteristics, but with relatively preserved cerebral blood volume and delayed collateral enhancement on the perfusion source images. 3. In conjunction with the noncontrast head CT (reported separately) the above constellation is favorable for endovascular reperfusion. This was discussed by telephone with Dr. Wallie Char on 07/19/2015 at 2156 hours. 4. No superimposed arterial stenosis in the neck. Tortuous bilateral carotid arteries. 5. Superimposed intracranial atherosclerosis elsewhere including moderate proximal basilar stenosis, mild stenosis of the dominant distal right vertebral artery, and mild to moderate bilateral PCA and right MCA branch irregularity. 6.  Bilateral upper lobe pulmonary ground-glass opacity and septal thickening may indicate developing interstitial edema. Electronically Signed   By: Genevie Ann M.D.   On: 07/19/2015 22:31   Ct Head Wo Contrast  07/19/2015  CLINICAL DATA:  81 year old female found out site. Right-sided weakness. Code stroke. EXAM: CT  HEAD WITHOUT CONTRAST TECHNIQUE: Contiguous axial images were obtained from the base of the skull through the vertex without intravenous contrast. COMPARISON:  None FINDINGS: There is mild prominence of the ventricles and sulci compatible with age-related atrophy. Moderate periventricular and deep white matter chronic microvascular ischemic changes noted. There is no acute intracranial hemorrhage. No mass effect or midline shift noted. Stop the visualized paranasal sinuses and mastoid air cells are clear. The calvarium is intact. IMPRESSION: No acute intracranial hemorrhage. Age-related atrophy and chronic microvascular ischemic disease. If symptoms persist and there are no contraindications, MRI may provide better evaluation if clinically indicated. Electronically Signed   By: Anner Crete M.D.   On: 07/19/2015 21:56   Ct Angio Neck W/cm &/or Wo/cm  07/19/2015  CLINICAL DATA:  82 year old female code stroke patient with right side weakness. Initial encounter. EXAM: CT ANGIOGRAPHY HEAD AND NECK CT CEREBRAL PERFUSION WITH CONTRAST TECHNIQUE: Multidetector CT imaging of the head and neck was performed using the standard protocol during bolus administration of intravenous contrast. Multiplanar CT image reconstructions and MIPs were obtained to evaluate the vascular anatomy. Carotid stenosis measurements (when applicable) are obtained utilizing NASCET criteria, using the distal internal carotid diameter as the denominator. Cerebral CT perfusion was also performed and images were post processed into perfusion maps. CONTRAST:  73m OMNIPAQUE IOHEXOL 350 MG/ML SOLN COMPARISON:  Noncontrast head CT 2133 hours today. FINDINGS: CT PERFUSION Left MCA territory abnormal mean-transit-time and time-to-peak with associated decreased cerebral blood flow. Cerebral blood volume is relatively preserved, suggesting compensated ischemia and penumbra. On CT profusion source images delayed collateral enhancement in the left MCA  territory occurs. Furthermore, on the comparison noncontrast CT (reported separately) I estimate the ASPECTS score at 9 or 10, with little to no CT changes of left MCA ischemia. CTA NECK Skeleton: Osteopenia. No acute osseous abnormality identified. Other neck: Patchy ground-glass opacity with pulmonary septal thickening in the visualized upper lungs. No superior mediastinal lymphadenopathy. Negative thyroid, larynx, pharynx, parapharyngeal spaces, retropharyngeal space, sublingual space, submandibular glands and parotid glands. No cervical lymphadenopathy. Aortic arch: Slight bovine type arch configuration. Minimal arch atherosclerosis. No great vessel origin stenosis. Right carotid system: Tortuous right CCA in the lower neck. Minimal for age atherosclerosis at the right carotid bifurcation. Tortuous cervical right ICA, otherwise negative to the skullbase. Left carotid system: Minimal plaque at the left CCA origin. Tortuous proximal left CCA. Minimal plaque at the left carotid bifurcation which is widely patent. Tortuous cervical left ICA but otherwise negative to the skullbase. See intracranial findings below. Vertebral arteries:No proximal subclavian artery stenosis. Normal vertebral artery origins. Tortuous bilateral V1 segments. The right vertebral artery is mildly dominant. No vertebral artery stenosis to the skullbase. CTA HEAD Posterior circulation: Non dominant left vertebral artery is diminutive beyond the left PICA origin. Normal right PICA origin. Mild calcified and soft plaque in the distal right V4 segment resulting in mild stenosis. Patent vertebrobasilar junction. Moderate proximal basilar artery irregularity and stenosis. Irregularity continues to the mid basilar artery. The distal basilar artery has a more normal appearance. Normal SCA and right PCA origins. Fetal type left PCA origin with enhancing left posterior communicating artery. The right posterior communicating artery is diminutive or  absent. Bilateral PCA branches are irregular but patent. Anterior circulation: Patent right ICA siphon with calcified plaque. Mild right siphon stenosis. Patent right ICA terminus. Right MCA M1 segment is patent with mild to moderate irregularity. Right MCA bifurcation is patent with moderate irregularity which continues into the right MCA branches. Proximal left ICA siphon is patent but there is thrombus at the left ICA terminus extending from the level of the posterior communicating artery to the left MCA and ACA origins. Thrombus in both left M1 and A1 segments with reconstituted flow. Furthermore, the left A1 segment appears dominant. There is reconstituted flow at the anterior communicating artery and in the bilateral ACA branches which are irregular but patent. There is poor to intermediate degree of early reconstituted flow at the left MCA bifurcation. Attenuated left MCA M2 branches. Venous sinuses: Appear patent, but suboptimal venous contrast timing. Anatomic variants: Suspected dominant left ACA A1 segment. IMPRESSION: 1. Positive for emergent large vessel occlusion at the left ICA terminus. Thrombus from the level of the left posterior communicating artery extending into both the left M1 and A1 segments. Some early reconstituted left MCA and ACA enhancement. 2. Large area of abnormal left MCA perfusion characteristics, but with relatively preserved cerebral blood volume and delayed collateral enhancement on the perfusion source images. 3. In conjunction with the noncontrast head CT (reported separately) the above constellation is favorable for endovascular reperfusion. This was discussed by telephone with Dr. Wallie Char on 07/19/2015 at 2156 hours. 4. No superimposed arterial stenosis in the neck. Tortuous bilateral carotid arteries. 5. Superimposed intracranial atherosclerosis elsewhere including moderate proximal basilar stenosis, mild stenosis of the dominant distal right vertebral artery, and  mild to moderate bilateral PCA and right MCA branch irregularity. 6. Bilateral upper lobe pulmonary ground-glass opacity and septal thickening may indicate developing interstitial edema. Electronically Signed   By: Genevie Ann M.D.   On: 07/19/2015 22:31   Ct Cerebral Perfusion W/cm  07/19/2015  CLINICAL DATA:  82 year old female code stroke patient with right side weakness. Initial encounter. EXAM: CT ANGIOGRAPHY HEAD AND NECK CT CEREBRAL PERFUSION WITH CONTRAST TECHNIQUE: Multidetector CT imaging of the head and neck was performed using the standard protocol during bolus administration of intravenous contrast. Multiplanar CT image reconstructions and MIPs were obtained to evaluate the vascular anatomy. Carotid stenosis measurements (when applicable) are obtained utilizing NASCET criteria, using the distal internal carotid diameter as the denominator. Cerebral CT perfusion was also performed and images were post processed into perfusion maps. CONTRAST:  45m OMNIPAQUE IOHEXOL 350 MG/ML SOLN COMPARISON:  Noncontrast head CT 2133 hours today. FINDINGS: CT PERFUSION Left MCA territory abnormal mean-transit-time and time-to-peak with associated decreased cerebral blood flow. Cerebral blood volume is relatively preserved, suggesting compensated ischemia and penumbra. On CT profusion source images delayed collateral enhancement in the left MCA territory occurs. Furthermore, on the comparison noncontrast CT (reported separately) I estimate the ASPECTS score at 9 or 10, with little to no CT changes of left MCA ischemia. CTA NECK Skeleton: Osteopenia. No acute osseous abnormality identified. Other neck: Patchy ground-glass opacity with pulmonary septal thickening in the visualized upper lungs. No superior mediastinal lymphadenopathy. Negative thyroid, larynx, pharynx, parapharyngeal spaces, retropharyngeal space, sublingual space, submandibular glands and parotid glands. No cervical lymphadenopathy. Aortic arch: Slight  bovine type arch configuration. Minimal arch atherosclerosis. No great vessel origin stenosis. Right carotid system: Tortuous right CCA in the lower neck. Minimal for age atherosclerosis at the right carotid bifurcation. Tortuous cervical right ICA, otherwise negative to the skullbase.  Left carotid system: Minimal plaque at the left CCA origin. Tortuous proximal left CCA. Minimal plaque at the left carotid bifurcation which is widely patent. Tortuous cervical left ICA but otherwise negative to the skullbase. See intracranial findings below. Vertebral arteries:No proximal subclavian artery stenosis. Normal vertebral artery origins. Tortuous bilateral V1 segments. The right vertebral artery is mildly dominant. No vertebral artery stenosis to the skullbase. CTA HEAD Posterior circulation: Non dominant left vertebral artery is diminutive beyond the left PICA origin. Normal right PICA origin. Mild calcified and soft plaque in the distal right V4 segment resulting in mild stenosis. Patent vertebrobasilar junction. Moderate proximal basilar artery irregularity and stenosis. Irregularity continues to the mid basilar artery. The distal basilar artery has a more normal appearance. Normal SCA and right PCA origins. Fetal type left PCA origin with enhancing left posterior communicating artery. The right posterior communicating artery is diminutive or absent. Bilateral PCA branches are irregular but patent. Anterior circulation: Patent right ICA siphon with calcified plaque. Mild right siphon stenosis. Patent right ICA terminus. Right MCA M1 segment is patent with mild to moderate irregularity. Right MCA bifurcation is patent with moderate irregularity which continues into the right MCA branches. Proximal left ICA siphon is patent but there is thrombus at the left ICA terminus extending from the level of the posterior communicating artery to the left MCA and ACA origins. Thrombus in both left M1 and A1 segments with  reconstituted flow. Furthermore, the left A1 segment appears dominant. There is reconstituted flow at the anterior communicating artery and in the bilateral ACA branches which are irregular but patent. There is poor to intermediate degree of early reconstituted flow at the left MCA bifurcation. Attenuated left MCA M2 branches. Venous sinuses: Appear patent, but suboptimal venous contrast timing. Anatomic variants: Suspected dominant left ACA A1 segment. IMPRESSION: 1. Positive for emergent large vessel occlusion at the left ICA terminus. Thrombus from the level of the left posterior communicating artery extending into both the left M1 and A1 segments. Some early reconstituted left MCA and ACA enhancement. 2. Large area of abnormal left MCA perfusion characteristics, but with relatively preserved cerebral blood volume and delayed collateral enhancement on the perfusion source images. 3. In conjunction with the noncontrast head CT (reported separately) the above constellation is favorable for endovascular reperfusion. This was discussed by telephone with Dr. Wallie Char on 07/19/2015 at 2156 hours. 4. No superimposed arterial stenosis in the neck. Tortuous bilateral carotid arteries. 5. Superimposed intracranial atherosclerosis elsewhere including moderate proximal basilar stenosis, mild stenosis of the dominant distal right vertebral artery, and mild to moderate bilateral PCA and right MCA branch irregularity. 6. Bilateral upper lobe pulmonary ground-glass opacity and septal thickening may indicate developing interstitial edema. Electronically Signed   By: Genevie Ann M.D.   On: 07/19/2015 22:31    Assessment: 82 y.o. 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  Stroke Risk Factors - atrial fibrillation and hypertension  Plan: 1. Post TPA and IR intervention  management and neuro intensive care unit 2. MRI  of the brain without contrast 3. PT consult, OT consult, Speech consult 4. Echocardiogram 5. HgbA1c, fasting lipid panel 6. Prophylactic therapy-Antiplatelet med: Plavix for long-term antiplatelet therapy (allergic to aspirin) 7. Risk factor modification 8. Telemetry monitoring  This patient is critically ill and at significant risk of neurological worsening or death, and care requires constant monitoring of vital  signs, hemodynamics,respiratory and cardiac monitoring, neurological assessment, discussion with family, other specialists and medical decision making of high complexity. Total critical care time was 120 minutes.  C.R. Nicole Kindred, MD [facility] [phone]  07/19/2015, 11:33 PM

## 2015-07-20 ENCOUNTER — Inpatient Hospital Stay (HOSPITAL_COMMUNITY): Payer: PPO

## 2015-07-20 DIAGNOSIS — I639 Cerebral infarction, unspecified: Secondary | ICD-10-CM

## 2015-07-20 LAB — BLOOD GAS, ARTERIAL
Acid-base deficit: 0.8 mmol/L (ref 0.0–2.0)
BICARBONATE: 22.8 meq/L (ref 20.0–24.0)
DRAWN BY: 24513
FIO2: 0.3
O2 SAT: 93.6 %
PATIENT TEMPERATURE: 96.7
PCO2 ART: 32.4 mmHg — AB (ref 35.0–45.0)
PEEP: 5 cmH2O
PH ART: 7.456 — AB (ref 7.350–7.450)
RATE: 15 resp/min
TCO2: 23.9 mmol/L (ref 0–100)
VT: 420 mL
pO2, Arterial: 65.5 mmHg — ABNORMAL LOW (ref 80.0–100.0)

## 2015-07-20 LAB — GLUCOSE, CAPILLARY
GLUCOSE-CAPILLARY: 127 mg/dL — AB (ref 65–99)
GLUCOSE-CAPILLARY: 99 mg/dL (ref 65–99)
Glucose-Capillary: 168 mg/dL — ABNORMAL HIGH (ref 65–99)
Glucose-Capillary: 130 mg/dL — ABNORMAL HIGH (ref 65–99)
Glucose-Capillary: 111 mg/dL — ABNORMAL HIGH (ref 65–99)

## 2015-07-20 LAB — CBC WITH DIFFERENTIAL/PLATELET
BASOS PCT: 0 %
Basophils Absolute: 0 10*3/uL (ref 0.0–0.1)
EOS ABS: 0 10*3/uL (ref 0.0–0.7)
EOS PCT: 0 %
HCT: 39.3 % (ref 36.0–46.0)
Hemoglobin: 12.6 g/dL (ref 12.0–15.0)
LYMPHS ABS: 1.1 10*3/uL (ref 0.7–4.0)
Lymphocytes Relative: 16 %
MCH: 30 pg (ref 26.0–34.0)
MCHC: 32.1 g/dL (ref 30.0–36.0)
MCV: 93.6 fL (ref 78.0–100.0)
MONO ABS: 0.3 10*3/uL (ref 0.1–1.0)
Monocytes Relative: 4 %
NEUTROS ABS: 5.4 10*3/uL (ref 1.7–7.7)
NEUTROS PCT: 80 %
PLATELETS: 117 10*3/uL — AB (ref 150–400)
RBC: 4.2 MIL/uL (ref 3.87–5.11)
RDW: 12.9 % (ref 11.5–15.5)
WBC: 6.8 10*3/uL (ref 4.0–10.5)

## 2015-07-20 LAB — BASIC METABOLIC PANEL
Anion gap: 11 (ref 5–15)
BUN: 20 mg/dL (ref 6–20)
CALCIUM: 8.4 mg/dL — AB (ref 8.9–10.3)
CO2: 22 mmol/L (ref 22–32)
CREATININE: 0.76 mg/dL (ref 0.44–1.00)
Chloride: 108 mmol/L (ref 101–111)
GFR calc non Af Amer: >60 mL/min (ref >60)
Glucose, Bld: 197 mg/dL — ABNORMAL HIGH (ref 65–99)
Potassium: 3.2 mmol/L — ABNORMAL LOW (ref 3.5–5.1)
SODIUM: 141 mmol/L (ref 135–145)

## 2015-07-20 LAB — MRSA PCR SCREENING: MRSA by PCR: NEGATIVE

## 2015-07-20 LAB — LIPID PANEL
CHOL/HDL RATIO: 2.7 ratio
CHOLESTEROL: 129 mg/dL (ref 0–200)
HDL: 48 mg/dL (ref >40)
LDL Cholesterol: 74 mg/dL (ref 0–99)
TRIGLYCERIDES: 33 mg/dL (ref <150)
VLDL: 7 mg/dL (ref 0–40)

## 2015-07-20 MED ORDER — ACETAMINOPHEN 650 MG RE SUPP: 650.0000 mg | RECTAL | Status: DC | PRN

## 2015-07-20 MED ORDER — FENTANYL CITRATE (PF) 100 MCG/2ML IJ SOLN
25.0000 ug | INTRAMUSCULAR | Status: DC | PRN
  Administered 2015-07-20 (×3): 100 ug via INTRAVENOUS
  Administered 2015-07-21: 25 ug via INTRAVENOUS
  Administered 2015-07-21: 50 ug via INTRAVENOUS
  Filled 2015-07-20 (×5): qty 2

## 2015-07-20 MED ORDER — ENOXAPARIN SODIUM 40 MG/0.4ML ~~LOC~~ SOLN
40.0000 mg | SUBCUTANEOUS | Status: DC
  Administered 2015-07-21 – 2015-07-25 (×5): 40 mg via SUBCUTANEOUS
  Filled 2015-07-20 (×5): qty 0.4

## 2015-07-20 MED ORDER — SUFENTANIL CITRATE 50 MCG/ML IV SOLN
INTRAVENOUS | Status: DC | PRN
  Administered 2015-07-20: 5 ug via INTRAVENOUS

## 2015-07-20 MED ORDER — SODIUM CHLORIDE 0.9 % IV SOLN
INTRAVENOUS | Status: DC
  Administered 2015-07-20 (×2): via INTRAVENOUS

## 2015-07-20 MED ORDER — PHENYLEPHRINE HCL 10 MG/ML IJ SOLN
10.0000 mg | INTRAVENOUS | Status: DC | PRN
  Administered 2015-07-20: 10 ug/min via INTRAVENOUS

## 2015-07-20 MED ORDER — PANTOPRAZOLE SODIUM 40 MG IV SOLR
40.0000 mg | Freq: Every day | INTRAVENOUS | Status: DC
  Administered 2015-07-20 – 2015-07-22 (×4): 40 mg via INTRAVENOUS
  Filled 2015-07-20 (×4): qty 40

## 2015-07-20 MED ORDER — LABETALOL HCL 5 MG/ML IV SOLN
10.0000 mg | INTRAVENOUS | Status: DC | PRN
  Administered 2015-07-22 – 2015-07-23 (×9): 10 mg via INTRAVENOUS
  Filled 2015-07-20 (×7): qty 4

## 2015-07-20 MED ORDER — STROKE: EARLY STAGES OF RECOVERY BOOK
Freq: Once | Status: AC
  Administered 2015-07-20: 02:00:00
  Filled 2015-07-20: qty 1

## 2015-07-20 MED ORDER — ACETAMINOPHEN 650 MG RE SUPP: 650.0000 mg | Freq: Four times a day (QID) | RECTAL | Status: DC | PRN

## 2015-07-20 MED ORDER — ANTISEPTIC ORAL RINSE SOLUTION (CORINZ)
7.0000 mL | OROMUCOSAL | Status: DC
  Administered 2015-07-20 – 2015-07-23 (×30): 7 mL via OROMUCOSAL

## 2015-07-20 MED ORDER — SODIUM CHLORIDE 0.9 % IV SOLN
INTRAVENOUS | Status: DC
  Administered 2015-07-21 – 2015-07-23 (×4): via INTRAVENOUS

## 2015-07-20 MED ORDER — CLOPIDOGREL BISULFATE 75 MG PO TABS: 75.0000 mg | ORAL_TABLET | Freq: Every day | ORAL | Status: DC

## 2015-07-20 MED ORDER — ACETAMINOPHEN 500 MG PO TABS: 1000.0000 mg | ORAL_TABLET | Freq: Four times a day (QID) | ORAL | Status: DC | PRN

## 2015-07-20 MED ORDER — ASPIRIN 300 MG RE SUPP
300.0000 mg | Freq: Once | RECTAL | Status: AC
  Administered 2015-07-20: 300 mg via RECTAL
  Filled 2015-07-20: qty 1

## 2015-07-20 MED ORDER — NICARDIPINE HCL IN NACL 20-0.86 MG/200ML-% IV SOLN
5.0000 mg/h | INTRAVENOUS | Status: DC
  Administered 2015-07-20: 10 mg/h via INTRAVENOUS
  Filled 2015-07-20: qty 200

## 2015-07-20 MED ORDER — ACETAMINOPHEN 325 MG PO TABS: 650.0000 mg | ORAL_TABLET | ORAL | Status: DC | PRN

## 2015-07-20 MED ORDER — INSULIN ASPART 100 UNIT/ML ~~LOC~~ SOLN
0.0000 [IU] | SUBCUTANEOUS | Status: DC
  Administered 2015-07-20: 1 [IU] via SUBCUTANEOUS
  Administered 2015-07-20: 2 [IU] via SUBCUTANEOUS
  Administered 2015-07-20 – 2015-07-22 (×5): 1 [IU] via SUBCUTANEOUS
  Administered 2015-07-23: 2 [IU] via SUBCUTANEOUS
  Administered 2015-07-23 (×2): 1 [IU] via SUBCUTANEOUS
  Administered 2015-07-24 – 2015-07-25 (×2): 2 [IU] via SUBCUTANEOUS
  Administered 2015-07-26 (×3): 1 [IU] via SUBCUTANEOUS
  Administered 2015-07-27: 2 [IU] via SUBCUTANEOUS
  Administered 2015-07-27: 4 [IU] via SUBCUTANEOUS
  Administered 2015-07-27: 3 [IU] via SUBCUTANEOUS
  Administered 2015-07-27 – 2015-07-28 (×2): 1 [IU] via SUBCUTANEOUS
  Administered 2015-07-28: 3 [IU] via SUBCUTANEOUS
  Administered 2015-07-28 (×2): 1 [IU] via SUBCUTANEOUS

## 2015-07-20 MED ORDER — SODIUM CHLORIDE 0.9 % IJ SOLN
25.0000 ug | INTRAVENOUS | Status: AC | PRN
  Administered 2015-07-20: 25 ug via INTRA_ARTERIAL

## 2015-07-20 MED ORDER — IOHEXOL 300 MG/ML  SOLN
150.0000 mL | Freq: Once | INTRAMUSCULAR | Status: AC | PRN
  Administered 2015-07-20: 125 mL via INTRAVENOUS

## 2015-07-20 MED ORDER — MIDAZOLAM HCL 2 MG/2ML IJ SOLN
INTRAMUSCULAR | Status: DC | PRN
  Administered 2015-07-20: 2 mg via INTRAVENOUS

## 2015-07-20 MED ORDER — CHLORHEXIDINE GLUCONATE 0.12% ORAL RINSE (MEDLINE KIT)
15.0000 mL | Freq: Two times a day (BID) | OROMUCOSAL | Status: DC
  Administered 2015-07-20 – 2015-07-23 (×7): 15 mL via OROMUCOSAL

## 2015-07-20 MED ORDER — ONDANSETRON HCL 4 MG/2ML IJ SOLN: 4.0000 mg | Freq: Four times a day (QID) | INTRAMUSCULAR | Status: DC | PRN

## 2015-07-20 MED ORDER — POTASSIUM CHLORIDE 10 MEQ/100ML IV SOLN
10.0000 meq | INTRAVENOUS | Status: AC
  Administered 2015-07-20 (×4): 10 meq via INTRAVENOUS
  Filled 2015-07-20 (×4): qty 100

## 2015-07-20 NOTE — Progress Notes (Signed)
Transported patient to MRI while on the ventilator. Patient remained stable during transport  

## 2015-07-20 NOTE — Progress Notes (Signed)
Utilization review completed.  

## 2015-07-20 NOTE — Consult Note (Signed)
PULMONARY / CRITICAL CARE MEDICINE   Name: Joy Holden MRN: [id number] DOB: [birth year]    ADMISSION DATE:  07/19/2015 CONSULTATION DATE:  07/20/2015  REFERRING MD :  Dr. Nicole Kindred  CHIEF COMPLAINT:  Dysarthria  HISTORY OF PRESENT ILLNESS:    82 y.o. female history of atrial fibrillation and hypertension brought to the emergency room following acute onset of speech abnormality, right facial droop and right hemiplegia. Patient was driving at the time of onset and was  Last normal about 8:15 PM on 07/19/2015.  She reportedly is allergic to aspirin.   In the ED CT head showed no acute intracranial abnormality. CT angiogram however showed occlusion of left ICA terminus with thrombus extending from the level of posterior indicating artery both M1 and A1 segments. She recived  TPA with no change in deficits. Her NIH stroke score was 26.   She was taken to interventional radiology and underwent revascularization of total occlusion of LT ICA, MCA, ACA for further management. She had a Solitaire 4 mm x 40 mm retrieval device and 5 mg of superselective IA TPA Blood pressure was elevated, requiring intervention with hydralazine IV.  PAST MEDICAL HISTORY :   has a past medical history of Hypertension; Cancer ([place]); and Asthma.  has past surgical history that includes Appendectomy; Abdominal hysterectomy; Cervical spine surgery; Myomectomy; Breast surgery; Ectopic pregnancy surgery; and Neck surgery. Prior to Admission medications   Medication Sig Start Date End Date Taking? Authorizing Provider  carboxymethylcellulose (REFRESH PLUS) 0.5 % SOLN Place 1 drop into both eyes 3 (three) times daily as needed (for dry eyes).   Yes Historical Provider, MD  Cholecalciferol (VITAMIN D3) 1000 UNITS CAPS Take 1 capsule by mouth daily.    Yes Historical Provider, MD  diltiazem (TIAZAC) 180 MG 24 hr capsule Take 180 mg by mouth daily.     Yes Historical Provider, MD  ergocalciferol (DRISDOL) 8000 UNIT/ML drops Take  5,000 Units by mouth daily.   Yes Historical Provider, MD  Garlic [id number] MG CAPS Take 1,000 mg by mouth daily.   Yes Historical Provider, MD  Ginger, Zingiber officinalis, (GINGER EXTRACT) 250 MG CAPS Take 250 mg by mouth daily.   Yes Historical Provider, MD  hydrALAZINE (APRESOLINE) 25 MG tablet Take 25 mg by mouth 2 (two) times daily as needed.    Yes Historical Provider, MD  hydrochlorothiazide (HYDRODIURIL) 25 MG tablet Take 25 mg by mouth daily.   Yes Historical Provider, MD  hydroxypropyl methylcellulose / hypromellose (ISOPTO TEARS / GONIOVISC) 2.5 % ophthalmic solution Place 1 drop into both eyes as needed for dry eyes.   Yes Historical Provider, MD  lisinopril (PRINIVIL,ZESTRIL) 20 MG tablet Take 20 mg by mouth daily.   Yes Historical Provider, MD  magnesium gluconate (MAGONATE) 500 MG tablet Take 500 mg by mouth daily.   Yes Historical Provider, MD  metoprolol (TOPROL-XL) 100 MG 24 hr tablet Take 100 mg by mouth daily.     Yes Historical Provider, MD  Multiple Vitamin (MULTIVITAMIN WITH MINERALS) TABS tablet Take 1 tablet by mouth daily.   Yes Historical Provider, MD  Multiple Vitamins-Minerals (ALIVE WOMENS ENERGY) TABS Take 1 tablet by mouth daily.   Yes Historical Provider, MD  potassium chloride SA (K-DUR,KLOR-CON) 20 MEQ tablet Take 20 mEq by mouth 2 (two) times daily.     Yes Historical Provider, MD  vitamin C (ASCORBIC ACID) 500 MG tablet Take 500 mg by mouth daily.     Yes Historical Provider, MD  Vitamin D, Ergocalciferol, (  DRISDOL) 50000 units CAPS capsule Take 50,000 Units by mouth every 7 (seven) days.   Yes Historical Provider, MD   Allergies  Allergen Reactions  . Penicillins Rash  . Aspirin     Burning in stomach  . Morphine     Other reaction(s): Delusions (intolerance)  . Sulfur Nausea Only    FAMILY HISTORY:  has no family status information on file.  SOCIAL HISTORY:  reports that she has never smoked. She has never used smokeless tobacco. She reports that she  does not drink alcohol or use illicit drugs.  REVIEW OF SYSTEMS:  Unable to obtain  SUBJECTIVE:   VITAL SIGNS: Temp:  [95.3 F (35.2 C)-96.7 F (35.9 C)] 95.3 F (35.2 C) (03/26 0300) Pulse Rate:  [43-137] 64 (03/26 0415) Resp:  [13-22] 15 (03/26 0415) BP: (80-217)/(49-113) 86/49 mmHg (03/26 0415) SpO2:  [93 %-100 %] 98 % (03/26 0415) Arterial Line BP: (98-207)/(43-83) 101/45 mmHg (03/26 0415) FiO2 (%):  [30 %-40 %] 30 % (03/26 0301) Weight:  [70.3 kg (154 lb 15.7 oz)] 70.3 kg (154 lb 15.7 oz) (03/25 2200) HEMODYNAMICS:   VENTILATOR SETTINGS: Vent Mode:  [-] PRVC FiO2 (%):  [30 %-40 %] 30 % Set Rate:  [15 bmp] 15 bmp Vt Set:  [420 mL] 420 mL PEEP:  [5 cmH20] 5 cmH20 Plateau Pressure:  [17 cmH20-20 cmH20] 17 cmH20 INTAKE / OUTPUT:  Intake/Output Summary (Last 24 hours) at 07/20/15 0422 Last data filed at 07/20/15 0400  Gross per 24 hour  Intake   1000 ml  Output   1110 ml  Net   -110 ml    PHYSICAL EXAMINATION: General:  Obtunded,  Neuro:  No w/d to pain, no gag or cough. HEENT:  ETT in place Cardiovascular:  Bradycardic, irregular Lungs:  CTA b/l no w/r/r Abdomen:  Soft, non tender, non distended, no bowel sounds Musculoskeletal:  Normal bulk and tone Skin:  1+ pitting edema b/l LE.  Bilateral groin A-lines, no hematomas or bruits  LABS:  CBC  Recent Labs Lab 07/19/15 2121 07/19/15 2126  WBC 5.9  --   HGB 14.4 16.0*  HCT 44.0 47.0*  PLT 138*  --    Coag's  Recent Labs Lab 07/19/15 2121  APTT 31  INR 1.20   BMET  Recent Labs Lab 07/19/15 2121 07/19/15 2126  NA 141 143  K 3.8 3.7  CL 108 106  CO2 23  --   BUN 21* 26*  CREATININE 0.83 0.80  GLUCOSE 140* 134*   Electrolytes  Recent Labs Lab 07/19/15 2121  CALCIUM 9.3   Sepsis Markers No results for input(s): LATICACIDVEN, PROCALCITON, O2SATVEN in the last 168 hours. ABG  Recent Labs Lab 07/20/15 0400  PHART 7.456*  PCO2ART 32.4*  PO2ART 65.5*   Liver Enzymes  Recent  Labs Lab 07/19/15 2121  AST 25  ALT 26  ALKPHOS 107  BILITOT 0.7  ALBUMIN 4.2   Cardiac Enzymes No results for input(s): TROPONINI, PROBNP in the last 168 hours. Glucose  Recent Labs Lab 07/19/15 2205  GLUCAP 115*    Imaging Ct Angio Head W/cm &/or Wo Cm  07/19/2015  CLINICAL DATA:  82 year old female code stroke patient with right side weakness. Initial encounter. EXAM: CT ANGIOGRAPHY HEAD AND NECK CT CEREBRAL PERFUSION WITH CONTRAST TECHNIQUE: Multidetector CT imaging of the head and neck was performed using the standard protocol during bolus administration of intravenous contrast. Multiplanar CT image reconstructions and MIPs were obtained to evaluate the vascular anatomy. Carotid stenosis measurements (  when applicable) are obtained utilizing NASCET criteria, using the distal internal carotid diameter as the denominator. Cerebral CT perfusion was also performed and images were post processed into perfusion maps. CONTRAST:  32mL OMNIPAQUE IOHEXOL 350 MG/ML SOLN COMPARISON:  Noncontrast head CT 2133 hours today. FINDINGS: CT PERFUSION Left MCA territory abnormal mean-transit-time and time-to-peak with associated decreased cerebral blood flow. Cerebral blood volume is relatively preserved, suggesting compensated ischemia and penumbra. On CT profusion source images delayed collateral enhancement in the left MCA territory occurs. Furthermore, on the comparison noncontrast CT (reported separately) I estimate the ASPECTS score at 9 or 10, with little to no CT changes of left MCA ischemia. CTA NECK Skeleton: Osteopenia. No acute osseous abnormality identified. Other neck: Patchy ground-glass opacity with pulmonary septal thickening in the visualized upper lungs. No superior mediastinal lymphadenopathy. Negative thyroid, larynx, pharynx, parapharyngeal spaces, retropharyngeal space, sublingual space, submandibular glands and parotid glands. No cervical lymphadenopathy. Aortic arch: Slight bovine type  arch configuration. Minimal arch atherosclerosis. No great vessel origin stenosis. Right carotid system: Tortuous right CCA in the lower neck. Minimal for age atherosclerosis at the right carotid bifurcation. Tortuous cervical right ICA, otherwise negative to the skullbase. Left carotid system: Minimal plaque at the left CCA origin. Tortuous proximal left CCA. Minimal plaque at the left carotid bifurcation which is widely patent. Tortuous cervical left ICA but otherwise negative to the skullbase. See intracranial findings below. Vertebral arteries:No proximal subclavian artery stenosis. Normal vertebral artery origins. Tortuous bilateral V1 segments. The right vertebral artery is mildly dominant. No vertebral artery stenosis to the skullbase. CTA HEAD Posterior circulation: Non dominant left vertebral artery is diminutive beyond the left PICA origin. Normal right PICA origin. Mild calcified and soft plaque in the distal right V4 segment resulting in mild stenosis. Patent vertebrobasilar junction. Moderate proximal basilar artery irregularity and stenosis. Irregularity continues to the mid basilar artery. The distal basilar artery has a more normal appearance. Normal SCA and right PCA origins. Fetal type left PCA origin with enhancing left posterior communicating artery. The right posterior communicating artery is diminutive or absent. Bilateral PCA branches are irregular but patent. Anterior circulation: Patent right ICA siphon with calcified plaque. Mild right siphon stenosis. Patent right ICA terminus. Right MCA M1 segment is patent with mild to moderate irregularity. Right MCA bifurcation is patent with moderate irregularity which continues into the right MCA branches. Proximal left ICA siphon is patent but there is thrombus at the left ICA terminus extending from the level of the posterior communicating artery to the left MCA and ACA origins. Thrombus in both left M1 and A1 segments with reconstituted flow.  Furthermore, the left A1 segment appears dominant. There is reconstituted flow at the anterior communicating artery and in the bilateral ACA branches which are irregular but patent. There is poor to intermediate degree of early reconstituted flow at the left MCA bifurcation. Attenuated left MCA M2 branches. Venous sinuses: Appear patent, but suboptimal venous contrast timing. Anatomic variants: Suspected dominant left ACA A1 segment. IMPRESSION: 1. Positive for emergent large vessel occlusion at the left ICA terminus. Thrombus from the level of the left posterior communicating artery extending into both the left M1 and A1 segments. Some early reconstituted left MCA and ACA enhancement. 2. Large area of abnormal left MCA perfusion characteristics, but with relatively preserved cerebral blood volume and delayed collateral enhancement on the perfusion source images. 3. In conjunction with the noncontrast head CT (reported separately) the above constellation is favorable for endovascular reperfusion. This was discussed  by telephone with Dr. Wallie Char on 07/19/2015 at 2156 hours. 4. No superimposed arterial stenosis in the neck. Tortuous bilateral carotid arteries. 5. Superimposed intracranial atherosclerosis elsewhere including moderate proximal basilar stenosis, mild stenosis of the dominant distal right vertebral artery, and mild to moderate bilateral PCA and right MCA branch irregularity. 6. Bilateral upper lobe pulmonary ground-glass opacity and septal thickening may indicate developing interstitial edema. Electronically Signed   By: Genevie Ann M.D.   On: 07/19/2015 22:31   Ct Head Wo Contrast  07/20/2015  CLINICAL DATA:  Stroke, follow-up status post revascularization of LEFT internal carotid artery, LEFT middle cerebral and LEFT anterior cerebral arteries. EXAM: CT HEAD WITHOUT CONTRAST TECHNIQUE: Contiguous axial images were obtained from the base of the skull through the vertex without intravenous contrast.  COMPARISON:  CT and CTA head July 19, 2015 FINDINGS: INTRACRANIAL CONTENTS: The ventricles and sulci are normal for age. No intraparenchymal hemorrhage, mass effect nor midline shift. Patchy supratentorial white matter hypodensities are less than expected for patient's age and though non-specific likely represent chronic small vessel ischemic disease. No acute large vascular territory infarcts. No abnormal extra-axial fluid collections. Basal cisterns are patent. Moderate calcific atherosclerosis of the carotid siphons. Intravascular residual contrast. ORBITS: The included ocular globes and orbital contents are non-suspicious. Status post bilateral ocular lens implants. SINUSES: The mastoid aircells and included paranasal sinuses are well-aerated. SKULL/SOFT TISSUES: No skull fracture. No significant soft tissue swelling. IMPRESSION: No acute intracranial process, status post cerebral angiogram with residual intravascular contrast. Involutional changes and mild chronic small vessel ischemic disease. Electronically Signed   By: Elon Alas M.D.   On: 07/20/2015 01:31   Ct Head Wo Contrast  07/19/2015  CLINICAL DATA:  82 year old female found out site. Right-sided weakness. Code stroke. EXAM: CT HEAD WITHOUT CONTRAST TECHNIQUE: Contiguous axial images were obtained from the base of the skull through the vertex without intravenous contrast. COMPARISON:  None FINDINGS: There is mild prominence of the ventricles and sulci compatible with age-related atrophy. Moderate periventricular and deep white matter chronic microvascular ischemic changes noted. There is no acute intracranial hemorrhage. No mass effect or midline shift noted. Stop the visualized paranasal sinuses and mastoid air cells are clear. The calvarium is intact. IMPRESSION: No acute intracranial hemorrhage. Age-related atrophy and chronic microvascular ischemic disease. If symptoms persist and there are no contraindications, MRI may provide better  evaluation if clinically indicated. Electronically Signed   By: Anner Crete M.D.   On: 07/19/2015 21:56   Ct Angio Neck W/cm &/or Wo/cm  07/19/2015  CLINICAL DATA:  82 year old female code stroke patient with right side weakness. Initial encounter. EXAM: CT ANGIOGRAPHY HEAD AND NECK CT CEREBRAL PERFUSION WITH CONTRAST TECHNIQUE: Multidetector CT imaging of the head and neck was performed using the standard protocol during bolus administration of intravenous contrast. Multiplanar CT image reconstructions and MIPs were obtained to evaluate the vascular anatomy. Carotid stenosis measurements (when applicable) are obtained utilizing NASCET criteria, using the distal internal carotid diameter as the denominator. Cerebral CT perfusion was also performed and images were post processed into perfusion maps. CONTRAST:  31mL OMNIPAQUE IOHEXOL 350 MG/ML SOLN COMPARISON:  Noncontrast head CT 2133 hours today. FINDINGS: CT PERFUSION Left MCA territory abnormal mean-transit-time and time-to-peak with associated decreased cerebral blood flow. Cerebral blood volume is relatively preserved, suggesting compensated ischemia and penumbra. On CT profusion source images delayed collateral enhancement in the left MCA territory occurs. Furthermore, on the comparison noncontrast CT (reported separately) I estimate the ASPECTS score at  9 or 10, with little to no CT changes of left MCA ischemia. CTA NECK Skeleton: Osteopenia. No acute osseous abnormality identified. Other neck: Patchy ground-glass opacity with pulmonary septal thickening in the visualized upper lungs. No superior mediastinal lymphadenopathy. Negative thyroid, larynx, pharynx, parapharyngeal spaces, retropharyngeal space, sublingual space, submandibular glands and parotid glands. No cervical lymphadenopathy. Aortic arch: Slight bovine type arch configuration. Minimal arch atherosclerosis. No great vessel origin stenosis. Right carotid system: Tortuous right CCA in the  lower neck. Minimal for age atherosclerosis at the right carotid bifurcation. Tortuous cervical right ICA, otherwise negative to the skullbase. Left carotid system: Minimal plaque at the left CCA origin. Tortuous proximal left CCA. Minimal plaque at the left carotid bifurcation which is widely patent. Tortuous cervical left ICA but otherwise negative to the skullbase. See intracranial findings below. Vertebral arteries:No proximal subclavian artery stenosis. Normal vertebral artery origins. Tortuous bilateral V1 segments. The right vertebral artery is mildly dominant. No vertebral artery stenosis to the skullbase. CTA HEAD Posterior circulation: Non dominant left vertebral artery is diminutive beyond the left PICA origin. Normal right PICA origin. Mild calcified and soft plaque in the distal right V4 segment resulting in mild stenosis. Patent vertebrobasilar junction. Moderate proximal basilar artery irregularity and stenosis. Irregularity continues to the mid basilar artery. The distal basilar artery has a more normal appearance. Normal SCA and right PCA origins. Fetal type left PCA origin with enhancing left posterior communicating artery. The right posterior communicating artery is diminutive or absent. Bilateral PCA branches are irregular but patent. Anterior circulation: Patent right ICA siphon with calcified plaque. Mild right siphon stenosis. Patent right ICA terminus. Right MCA M1 segment is patent with mild to moderate irregularity. Right MCA bifurcation is patent with moderate irregularity which continues into the right MCA branches. Proximal left ICA siphon is patent but there is thrombus at the left ICA terminus extending from the level of the posterior communicating artery to the left MCA and ACA origins. Thrombus in both left M1 and A1 segments with reconstituted flow. Furthermore, the left A1 segment appears dominant. There is reconstituted flow at the anterior communicating artery and in the  bilateral ACA branches which are irregular but patent. There is poor to intermediate degree of early reconstituted flow at the left MCA bifurcation. Attenuated left MCA M2 branches. Venous sinuses: Appear patent, but suboptimal venous contrast timing. Anatomic variants: Suspected dominant left ACA A1 segment. IMPRESSION: 1. Positive for emergent large vessel occlusion at the left ICA terminus. Thrombus from the level of the left posterior communicating artery extending into both the left M1 and A1 segments. Some early reconstituted left MCA and ACA enhancement. 2. Large area of abnormal left MCA perfusion characteristics, but with relatively preserved cerebral blood volume and delayed collateral enhancement on the perfusion source images. 3. In conjunction with the noncontrast head CT (reported separately) the above constellation is favorable for endovascular reperfusion. This was discussed by telephone with Dr. Wallie Char on 07/19/2015 at 2156 hours. 4. No superimposed arterial stenosis in the neck. Tortuous bilateral carotid arteries. 5. Superimposed intracranial atherosclerosis elsewhere including moderate proximal basilar stenosis, mild stenosis of the dominant distal right vertebral artery, and mild to moderate bilateral PCA and right MCA branch irregularity. 6. Bilateral upper lobe pulmonary ground-glass opacity and septal thickening may indicate developing interstitial edema. Electronically Signed   By: Genevie Ann M.D.   On: 07/19/2015 22:31   Ct Cerebral Perfusion W/cm  07/19/2015  CLINICAL DATA:  82 year old female code stroke patient with right  side weakness. Initial encounter. EXAM: CT ANGIOGRAPHY HEAD AND NECK CT CEREBRAL PERFUSION WITH CONTRAST TECHNIQUE: Multidetector CT imaging of the head and neck was performed using the standard protocol during bolus administration of intravenous contrast. Multiplanar CT image reconstructions and MIPs were obtained to evaluate the vascular anatomy. Carotid  stenosis measurements (when applicable) are obtained utilizing NASCET criteria, using the distal internal carotid diameter as the denominator. Cerebral CT perfusion was also performed and images were post processed into perfusion maps. CONTRAST:  18mL OMNIPAQUE IOHEXOL 350 MG/ML SOLN COMPARISON:  Noncontrast head CT 2133 hours today. FINDINGS: CT PERFUSION Left MCA territory abnormal mean-transit-time and time-to-peak with associated decreased cerebral blood flow. Cerebral blood volume is relatively preserved, suggesting compensated ischemia and penumbra. On CT profusion source images delayed collateral enhancement in the left MCA territory occurs. Furthermore, on the comparison noncontrast CT (reported separately) I estimate the ASPECTS score at 9 or 10, with little to no CT changes of left MCA ischemia. CTA NECK Skeleton: Osteopenia. No acute osseous abnormality identified. Other neck: Patchy ground-glass opacity with pulmonary septal thickening in the visualized upper lungs. No superior mediastinal lymphadenopathy. Negative thyroid, larynx, pharynx, parapharyngeal spaces, retropharyngeal space, sublingual space, submandibular glands and parotid glands. No cervical lymphadenopathy. Aortic arch: Slight bovine type arch configuration. Minimal arch atherosclerosis. No great vessel origin stenosis. Right carotid system: Tortuous right CCA in the lower neck. Minimal for age atherosclerosis at the right carotid bifurcation. Tortuous cervical right ICA, otherwise negative to the skullbase. Left carotid system: Minimal plaque at the left CCA origin. Tortuous proximal left CCA. Minimal plaque at the left carotid bifurcation which is widely patent. Tortuous cervical left ICA but otherwise negative to the skullbase. See intracranial findings below. Vertebral arteries:No proximal subclavian artery stenosis. Normal vertebral artery origins. Tortuous bilateral V1 segments. The right vertebral artery is mildly dominant. No  vertebral artery stenosis to the skullbase. CTA HEAD Posterior circulation: Non dominant left vertebral artery is diminutive beyond the left PICA origin. Normal right PICA origin. Mild calcified and soft plaque in the distal right V4 segment resulting in mild stenosis. Patent vertebrobasilar junction. Moderate proximal basilar artery irregularity and stenosis. Irregularity continues to the mid basilar artery. The distal basilar artery has a more normal appearance. Normal SCA and right PCA origins. Fetal type left PCA origin with enhancing left posterior communicating artery. The right posterior communicating artery is diminutive or absent. Bilateral PCA branches are irregular but patent. Anterior circulation: Patent right ICA siphon with calcified plaque. Mild right siphon stenosis. Patent right ICA terminus. Right MCA M1 segment is patent with mild to moderate irregularity. Right MCA bifurcation is patent with moderate irregularity which continues into the right MCA branches. Proximal left ICA siphon is patent but there is thrombus at the left ICA terminus extending from the level of the posterior communicating artery to the left MCA and ACA origins. Thrombus in both left M1 and A1 segments with reconstituted flow. Furthermore, the left A1 segment appears dominant. There is reconstituted flow at the anterior communicating artery and in the bilateral ACA branches which are irregular but patent. There is poor to intermediate degree of early reconstituted flow at the left MCA bifurcation. Attenuated left MCA M2 branches. Venous sinuses: Appear patent, but suboptimal venous contrast timing. Anatomic variants: Suspected dominant left ACA A1 segment. IMPRESSION: 1. Positive for emergent large vessel occlusion at the left ICA terminus. Thrombus from the level of the left posterior communicating artery extending into both the left M1 and A1 segments. Some early  reconstituted left MCA and ACA enhancement. 2. Large area of  abnormal left MCA perfusion characteristics, but with relatively preserved cerebral blood volume and delayed collateral enhancement on the perfusion source images. 3. In conjunction with the noncontrast head CT (reported separately) the above constellation is favorable for endovascular reperfusion. This was discussed by telephone with Dr. Wallie Char on 07/19/2015 at 2156 hours. 4. No superimposed arterial stenosis in the neck. Tortuous bilateral carotid arteries. 5. Superimposed intracranial atherosclerosis elsewhere including moderate proximal basilar stenosis, mild stenosis of the dominant distal right vertebral artery, and mild to moderate bilateral PCA and right MCA branch irregularity. 6. Bilateral upper lobe pulmonary ground-glass opacity and septal thickening may indicate developing interstitial edema. Electronically Signed   By: Genevie Ann M.D.   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  PULMONARY A: Intubated for airway protection during procedure P:   - ABG - current settings appropriate - PPI - Oral care - SBT in AM - CXR for ETT confirmation   CARDIOVASCULAR A:  Afib with slow Ventricular rate Bradycardia Hypertension Total occlusion of LT ICA, MCA, ACA  P:  - BP/MAP acceptable - no treatment for Afib at this time - Nitroglycerin ggt goal SBP 120-140 - s/p revascularization  RENAL A:   Not active P:   - maintain hydration given contrast load  GASTROINTESTINAL A:  Not active P:   - PPI  HEMATOLOGIC A:   Not active P:  - CBC  INFECTIOUS A:    Not active  ENDOCRINE A:   Hyperglycemia   P:   - SSI - Goal BG <180  NEUROLOGIC A:   S/p revascularization of Lt ICA, MCA, ACA S/p CVA Residual anesthesia effects likely causing absent neuro exam P:   RASS goal: 0 - currently holding sedation - Q2 Neuro check   Total critical care time: 30 min  Critical  care time was exclusive of separately billable procedures and treating other patients.  Critical care was necessary to treat or prevent imminent or life-threatening deterioration.  Critical care was time spent personally by me on the following activities: development of treatment plan with patient and/or surrogate as well as nursing, discussions with consultants, evaluation of patient's response to treatment, examination of patient, obtaining history from patient or surrogate, ordering and performing treatments and interventions, ordering and review of laboratory studies, ordering and review of radiographic studies, pulse oximetry and re-evaluation of patient's condition.   Meribeth Mattes, DO., MS [facility]    P[facility] and [place] Pager: [phone]  07/20/2015, 4:22 AM

## 2015-07-20 NOTE — Anesthesia Postprocedure Evaluation (Signed)
Anesthesia Post Note  Patient: Joy Holden  Procedure(s) Performed: Procedure(s) (LRB): RADIOLOGY WITH ANESTHESIA (N/A)  Patient location during evaluation: ICU Anesthesia Type: General Level of consciousness: sedated and patient remains intubated per anesthesia plan Pain management: pain level controlled Vital Signs Assessment: post-procedure vital signs reviewed and stable Respiratory status: patient remains intubated per anesthesia plan and respiratory function stable Cardiovascular status: blood pressure returned to baseline and stable Anesthetic complications: no    Last Vitals:  Filed Vitals:   07/19/15 2245 07/20/15 0200  BP: 141/83   Pulse: 61   Temp:  35.9 C  Resp: 19     Last Pain: There were no vitals filed for this visit.               Zenaida Deed

## 2015-07-20 NOTE — Progress Notes (Signed)
STROKE TEAM PROGRESS NOTE   HISTORY OF PRESENT ILLNESS at admission Joy Holden is an 82 y.o. female history of atrial fibrillation and hypertension brought to the emergency room following acute onset of speech abnormality, right facial droop and right hemiplegia. Patient was driving at the time of onset of her deficits. She was last known well at about 8:15 PM on 07/19/2015. She has no previous of stroke nor TIA. She has not been on anticoagulation or antiplatelet therapy. She reportedly is allergic to aspirin. CT scan of her head showed no acute intracranial abnormality.  CT angiogram however showed occlusion of left ICA terminus with thrombus extending from the level of posterior indicating artery both M1 and A1 segments. Patient was treated with IV TPA with no change in deficits. Her NIH stroke score was 26. She was subsequently taken to interventional radiology suite for further management. Blood pressure was elevated, requiring intervention with hydralazine IV. Underwent thrombectomy and intra-arterial TPA by Dr. Estanislado Pandy.  LSN: 8:15 PM on 07/19/2015 tPA Given: Yes mRankin:    SUBJECTIVE (INTERVAL HISTORY) Her RN was at the bedside.  Overall she feels her condition is gradually improving. She was beginning to follow intermittent commands.  Cardene needed for blood pressure control.     OBJECTIVE Temp:  [95.3 F (35.2 C)-97.7 F (36.5 C)] 97.7 F (36.5 C) (03/26 0800) Pulse Rate:  [33-137] 70 (03/26 0800) Cardiac Rhythm:  [-] Atrial fibrillation (03/26 0800) Resp:  [13-22] 15 (03/26 0800) BP: (80-217)/(47-113) 123/65 mmHg (03/26 0800) SpO2:  [92 %-100 %] 100 % (03/26 0800) Arterial Line BP: (82-207)/(43-83) 137/52 mmHg (03/26 0800) FiO2 (%):  [30 %-40 %] 30 % (03/26 0800) Weight:  [70.3 kg (154 lb 15.7 oz)] 70.3 kg (154 lb 15.7 oz) (03/25 2200)  CBC:  Recent Labs Lab 07/19/15 2121 07/19/15 2126 07/20/15 0450  WBC 5.9  --  6.8  NEUTROABS 2.7  --  5.4  HGB 14.4 16.0*  12.6  HCT 44.0 47.0* 39.3  MCV 94.8  --  93.6  PLT 138*  --  117*    Basic Metabolic Panel:  Recent Labs Lab 07/19/15 2121 07/19/15 2126 07/20/15 0450  NA 141 143 141  K 3.8 3.7 3.2*  CL 108 106 108  CO2 23  --  22  GLUCOSE 140* 134* 197*  BUN 21* 26* 20  CREATININE 0.83 0.80 0.76  CALCIUM 9.3  --  8.4*    Lipid Panel:    Component Value Date/Time   CHOL 129 07/20/2015 0350   TRIG 33 07/20/2015 0350   HDL 48 07/20/2015 0350   CHOLHDL 2.7 07/20/2015 0350   VLDL 7 07/20/2015 0350   LDLCALC 74 07/20/2015 0350   HgbA1c: No results found for: HGBA1C Urine Drug Screen:    Component Value Date/Time   LABOPIA NONE DETECTED 07/19/2015 2233   COCAINSCRNUR NONE DETECTED 07/19/2015 2233   LABBENZ NONE DETECTED 07/19/2015 2233   AMPHETMU NONE DETECTED 07/19/2015 2233   THCU NONE DETECTED 07/19/2015 2233   LABBARB NONE DETECTED 07/19/2015 2233      IMAGING  Ct Angio Head and Neck W/cm &/or Wo Cm 07/19/2015   1. Positive for emergent large vessel occlusion at the left ICA terminus. Thrombus from the level of the left posterior communicating artery extending into both the left M1 and A1 segments. Some early reconstituted left MCA and ACA enhancement.  2. Large area of abnormal left MCA perfusion characteristics, but with relatively preserved cerebral blood volume and delayed collateral enhancement on  the perfusion source images.  3. In conjunction with the noncontrast head CT (reported separately) the above constellation is favorable for endovascular reperfusion.  4. No superimposed arterial stenosis in the neck. Tortuous bilateral carotid arteries.  5. Superimposed intracranial atherosclerosis elsewhere including moderate proximal basilar stenosis, mild stenosis of the dominant distal right vertebral artery, and mild to moderate bilateral PCA and right MCA branch irregularity.  6. Bilateral upper lobe pulmonary ground-glass opacity and septal thickening may indicate developing  interstitial edema.    Ct Cerebral Perfusion W/cm 07/19/2015   1. Positive for emergent large vessel occlusion at the left ICA terminus. Thrombus from the level of the left posterior communicating artery extending into both the left M1 and A1 segments. Some early reconstituted left MCA and ACA enhancement.  2. Large area of abnormal left MCA perfusion characteristics, but with relatively preserved cerebral blood volume and delayed collateral enhancement on the perfusion source images.  3. In conjunction with the noncontrast head CT (reported separately) the above constellation is favorable for endovascular reperfusion.  4. No superimposed arterial stenosis in the neck. Tortuous bilateral carotid arteries.  5. Superimposed intracranial atherosclerosis elsewhere including moderate proximal basilar stenosis, mild stenosis of the dominant distal right vertebral artery, and mild to moderate bilateral PCA and right MCA branch irregularity.  6. Bilateral upper lobe pulmonary ground-glass opacity and septal thickening may indicate developing interstitial edema.     Ct Head Wo Contrast 07/20/2015   No acute intracranial process, status post cerebral angiogram with residual intravascular contrast. Involutional changes and mild chronic small vessel ischemic disease.     Ct Head Wo Contrast 07/19/2015   No acute intracranial hemorrhage. Age-related atrophy and chronic microvascular ischemic disease. If symptoms persist and there are no contraindications, MRI may provide better evaluation if clinically indicated.       Dg Chest Port 1 View 07/20/2015   Left lung base and right hilar hazy densities concerning for pneumonia superimposed on background of congestion/edema. Clinical correlation and follow-up recommended. Endotracheal tube above the carina.     PHYSICAL EXAM HEENT:  PERRL, sclera clear, ET tube in place Chest:  CTA  Card:  RRR Abd:  Soft, ND, NT normal bowel sounds Ext:  No  C/C/E  Mental Status:  Sedated, intermittently following one-step commands  Cranial Nerves:  PERRL, positive OCR, right corneal less reactive, left corneal normal.  Strong gag  Motor/Sensory:  Right sided hemiplegia; left arm withdraws less than left leg  Coordination:  Unable test  Gait:  Unable to test  ASSESSMENT/PLAN Joy Holden is a 82 y.o. female with history of atrial fibrillation without anticoagulation, hypertension, asthma, and cancer presenting with speech abnormality, right facial droop, and right hemiplegia.   She did  receive IV TPA Saturday, 07/19/2015 at 2215.  Stroke:  Dominant left MCA territory infarct felt to be embolic secondary to atrial fibrillation without anticoagulation.  Resultant  Right hemiplegia and unclear if aphasia given unable to test and command following was intermittent at best  MRI  pending  MRA  not performed  Carotid Doppler  refer to CTA of the neck  CTA head and neck - see above - left MCA infarct  2D Echo  pending  LDL 74  HgbA1c pending  VTE prophylaxis - SCDs  Diet NPO time specified  No antithrombotic prior to admission, now on No antithrombotic secondary to TPA.  Patient counseled to be compliant with her antithrombotic medications  Ongoing aggressive stroke risk factor management  Therapy recommendations:  Pending  Disposition: Pending  Hypertension  Blood pressure mildly low - currently not on antihypertensive medications. Permissive hypertension (OK if < 220/120) but gradually normalize in 5-7 days  Hyperlipidemia  Home meds:  No lipid lowering medications prior to admission  LDL 74, goal < 70  Other Stroke Risk Factors  Advanced age  Atrial fibrillation without anticoagulation  Other Active Problems  Mild thrombocytopenia  Hypokalemia - supplemented - recheck in a.m.  Head CT and MRI pending - start aspirin 24 hours post TPA if no bleed  Consider anticoagulation in the future for atrial  fibrillation   Hospital day # 1  Mikey Bussing PA-C [facility] Pager [phone] 07/20/2015, 11:57 AM  CRITICAL CARE NEUROLOGY ATTENDING NOTE Patient was seen and examined by me personally. I reviewed notes, independently viewed imaging studies, participated in medical decision making and plan of care. I have made additions or clarifications directly to the above note.  Documentation accurately reflects findings. The laboratory and radiographic studies were personally reviewed by me.  ROS pertinent positives could not be fully documented due to LOC  Assessment and plan completed by me personally and fully documented above.  Condition is improved  This patient is critically ill and at significant risk of neurological worsening, death and care requires constant monitoring of vital signs, hemodynamics,respiratory and cardiac monitoring, extensive review of multiple databases, frequent neurological assessment, discussion with family, other specialists and medical decision making of high complexity.  This critical care time does not reflect procedure time, or teaching time or supervisory time of PA/NP/Med Resident etc. but could involve care discussion time.  I spent 30 minutes of Neurocritical Care time in the care of  this patient.  SIGNED BY: Dr. Elissa Hefty     To contact Stroke Continuity provider, please refer to [email or web address] After hours, contact General Neurology

## 2015-07-20 NOTE — Progress Notes (Signed)
Orthopedic Tech Progress Note Patient Details:  Joy Holden 01-11-1925 [id number] Drop off 10 lbs sandbag. Patient ID: [name], female   DOB: [birth year], 82 y.o.   MRN: [id number]   Braulio Bosch 07/20/2015, 12:52 PM

## 2015-07-20 NOTE — Transfer of Care (Signed)
Immediate Anesthesia Transfer of Care Note  Patient: Joy Holden  Procedure(s) Performed: Procedure(s): RADIOLOGY WITH ANESTHESIA (N/A)  Patient Location: SICU  Anesthesia Type:General  Level of Consciousness: Patient remains intubated per anesthesia plan  Airway & Oxygen Therapy: Patient remains intubated per anesthesia plan and Patient placed on Ventilator (see vital sign flow sheet for setting)  Post-op Assessment: Report given to RN and Post -op Vital signs reviewed and stable  Post vital signs: Reviewed and stable  Last Vitals:  Filed Vitals:   07/19/15 2230 07/19/15 2245  BP: 161/113 141/83  Pulse: 63 61  Resp: 22 19    Complications: No apparent anesthesia complications

## 2015-07-20 NOTE — Consult Note (Signed)
PULMONARY / CRITICAL CARE MEDICINE   Name: Joy Holden MRN: [id number] DOB: 01/09/1925    ADMISSION DATE:  07/19/2015 CONSULTATION DATE:  07/20/2015  REFERRING MD :  Dr. Nicole Kindred  CHIEF COMPLAINT:  Dysarthria  HISTORY OF PRESENT ILLNESS:    82 y.o. female history of atrial fibrillation and hypertension brought to the emergency room following acute onset of speech abnormality, right facial droop and right hemiplegia. Patient was driving at the time of onset and was  Last normal about 8:15 PM on 07/19/2015.  She reportedly is allergic to aspirin.   In the ED CT head showed no acute intracranial abnormality. CT angiogram however showed occlusion of left ICA terminus with thrombus extending from the level of posterior indicating artery both M1 and A1 segments. She recived  TPA with no change in deficits. Her NIH stroke score was 26.   She was taken to interventional radiology and underwent revascularization of total occlusion of LT ICA, MCA, ACA for further management. She had a Solitaire 4 mm x 40 mm retrieval device and 5 mg of superselective IA TPA Blood pressure was elevated, requiring intervention with hydralazine IV.  SUBJECTIVE:  No acute events since IR procedure.  REVIEW OF SYSTEMS:  Unable to obtain given intubation and sedation.  VITAL SIGNS: Temp:  [95.3 F (35.2 C)-97.5 F (36.4 C)] 97.5 F (36.4 C) (03/26 0600) Pulse Rate:  [33-137] 66 (03/26 0700) Resp:  [13-22] 15 (03/26 0700) BP: (80-217)/(47-113) 111/55 mmHg (03/26 0700) SpO2:  [92 %-100 %] 100 % (03/26 0700) Arterial Line BP: (82-207)/(43-83) 133/58 mmHg (03/26 0700) FiO2 (%):  [30 %-40 %] 30 % (03/26 0600) Weight:  [70.3 kg (154 lb 15.7 oz)] 70.3 kg (154 lb 15.7 oz) (03/25 2200) HEMODYNAMICS:   VENTILATOR SETTINGS: Vent Mode:  [-] PRVC FiO2 (%):  [30 %-40 %] 30 % Set Rate:  [15 bmp] 15 bmp Vt Set:  [420 mL] 420 mL PEEP:  [5 cmH20] 5 cmH20 Plateau Pressure:  [17 cmH20-20 cmH20] 17 cmH20 INTAKE /  OUTPUT:  Intake/Output Summary (Last 24 hours) at 07/20/15 0741 Last data filed at 07/20/15 0700  Gross per 24 hour  Intake   1325 ml  Output   1335 ml  Net    -10 ml    PHYSICAL EXAMINATION: General:  Sedated. No family at bedside.  Integument:  Warm & dry. No rash on exposed skin.  HEENT:  Endotracheal tube in place. No scleral icterus or injection. Cardiovascular:  Regular rate. No edema. No appreciable JVD.  Pulmonary:  Clear bilaterally to auscultation bilaterally. Symmetric chest wall rise on ventilator. Abdomen: Soft. Normal bowel sounds. Nondistended.  Neurological:  Spontaneously moving all 4 extremities. Pupils equal.   LABS:  CBC  Recent Labs Lab 07/19/15 2121 07/19/15 2126 07/20/15 0450  WBC 5.9  --  6.8  HGB 14.4 16.0* 12.6  HCT 44.0 47.0* 39.3  PLT 138*  --  117*   Coag's  Recent Labs Lab 07/19/15 2121  APTT 31  INR 1.20   BMET  Recent Labs Lab 07/19/15 2121 07/19/15 2126 07/20/15 0450  NA 141 143 141  K 3.8 3.7 3.2*  CL 108 106 108  CO2 23  --  22  BUN 21* 26* 20  CREATININE 0.83 0.80 0.76  GLUCOSE 140* 134* 197*   Electrolytes  Recent Labs Lab 07/19/15 2121 07/20/15 0450  CALCIUM 9.3 8.4*   Sepsis Markers No results for input(s): LATICACIDVEN, PROCALCITON, O2SATVEN in the last 168 hours. ABG  Recent Labs  Lab 07/20/15 0400  PHART 7.456*  PCO2ART 32.4*  PO2ART 65.5*   Liver Enzymes  Recent Labs Lab 07/19/15 2121  AST 25  ALT 26  ALKPHOS 107  BILITOT 0.7  ALBUMIN 4.2   Cardiac Enzymes No results for input(s): TROPONINI, PROBNP in the last 168 hours. Glucose  Recent Labs Lab 07/19/15 2205  GLUCAP 115*    Imaging Ct Angio Head W/cm &/or Wo Cm  07/19/2015  CLINICAL DATA:  82 year old female code stroke patient with right side weakness. Initial encounter. EXAM: CT ANGIOGRAPHY HEAD AND NECK CT CEREBRAL PERFUSION WITH CONTRAST TECHNIQUE: Multidetector CT imaging of the head and neck was performed using the  standard protocol during bolus administration of intravenous contrast. Multiplanar CT image reconstructions and MIPs were obtained to evaluate the vascular anatomy. Carotid stenosis measurements (when applicable) are obtained utilizing NASCET criteria, using the distal internal carotid diameter as the denominator. Cerebral CT perfusion was also performed and images were post processed into perfusion maps. CONTRAST:  102mL OMNIPAQUE IOHEXOL 350 MG/ML SOLN COMPARISON:  Noncontrast head CT 2133 hours today. FINDINGS: CT PERFUSION Left MCA territory abnormal mean-transit-time and time-to-peak with associated decreased cerebral blood flow. Cerebral blood volume is relatively preserved, suggesting compensated ischemia and penumbra. On CT profusion source images delayed collateral enhancement in the left MCA territory occurs. Furthermore, on the comparison noncontrast CT (reported separately) I estimate the ASPECTS score at 9 or 10, with little to no CT changes of left MCA ischemia. CTA NECK Skeleton: Osteopenia. No acute osseous abnormality identified. Other neck: Patchy ground-glass opacity with pulmonary septal thickening in the visualized upper lungs. No superior mediastinal lymphadenopathy. Negative thyroid, larynx, pharynx, parapharyngeal spaces, retropharyngeal space, sublingual space, submandibular glands and parotid glands. No cervical lymphadenopathy. Aortic arch: Slight bovine type arch configuration. Minimal arch atherosclerosis. No great vessel origin stenosis. Right carotid system: Tortuous right CCA in the lower neck. Minimal for age atherosclerosis at the right carotid bifurcation. Tortuous cervical right ICA, otherwise negative to the skullbase. Left carotid system: Minimal plaque at the left CCA origin. Tortuous proximal left CCA. Minimal plaque at the left carotid bifurcation which is widely patent. Tortuous cervical left ICA but otherwise negative to the skullbase. See intracranial findings below.  Vertebral arteries:No proximal subclavian artery stenosis. Normal vertebral artery origins. Tortuous bilateral V1 segments. The right vertebral artery is mildly dominant. No vertebral artery stenosis to the skullbase. CTA HEAD Posterior circulation: Non dominant left vertebral artery is diminutive beyond the left PICA origin. Normal right PICA origin. Mild calcified and soft plaque in the distal right V4 segment resulting in mild stenosis. Patent vertebrobasilar junction. Moderate proximal basilar artery irregularity and stenosis. Irregularity continues to the mid basilar artery. The distal basilar artery has a more normal appearance. Normal SCA and right PCA origins. Fetal type left PCA origin with enhancing left posterior communicating artery. The right posterior communicating artery is diminutive or absent. Bilateral PCA branches are irregular but patent. Anterior circulation: Patent right ICA siphon with calcified plaque. Mild right siphon stenosis. Patent right ICA terminus. Right MCA M1 segment is patent with mild to moderate irregularity. Right MCA bifurcation is patent with moderate irregularity which continues into the right MCA branches. Proximal left ICA siphon is patent but there is thrombus at the left ICA terminus extending from the level of the posterior communicating artery to the left MCA and ACA origins. Thrombus in both left M1 and A1 segments with reconstituted flow. Furthermore, the left A1 segment appears dominant. There is reconstituted flow at  the anterior communicating artery and in the bilateral ACA branches which are irregular but patent. There is poor to intermediate degree of early reconstituted flow at the left MCA bifurcation. Attenuated left MCA M2 branches. Venous sinuses: Appear patent, but suboptimal venous contrast timing. Anatomic variants: Suspected dominant left ACA A1 segment. IMPRESSION: 1. Positive for emergent large vessel occlusion at the left ICA terminus. Thrombus from  the level of the left posterior communicating artery extending into both the left M1 and A1 segments. Some early reconstituted left MCA and ACA enhancement. 2. Large area of abnormal left MCA perfusion characteristics, but with relatively preserved cerebral blood volume and delayed collateral enhancement on the perfusion source images. 3. In conjunction with the noncontrast head CT (reported separately) the above constellation is favorable for endovascular reperfusion. This was discussed by telephone with Dr. Wallie Char on 07/19/2015 at 2156 hours. 4. No superimposed arterial stenosis in the neck. Tortuous bilateral carotid arteries. 5. Superimposed intracranial atherosclerosis elsewhere including moderate proximal basilar stenosis, mild stenosis of the dominant distal right vertebral artery, and mild to moderate bilateral PCA and right MCA branch irregularity. 6. Bilateral upper lobe pulmonary ground-glass opacity and septal thickening may indicate developing interstitial edema. Electronically Signed   By: Genevie Ann M.D.   On: 07/19/2015 22:31   Ct Head Wo Contrast  07/20/2015  CLINICAL DATA:  Stroke, follow-up status post revascularization of LEFT internal carotid artery, LEFT middle cerebral and LEFT anterior cerebral arteries. EXAM: CT HEAD WITHOUT CONTRAST TECHNIQUE: Contiguous axial images were obtained from the base of the skull through the vertex without intravenous contrast. COMPARISON:  CT and CTA head July 19, 2015 FINDINGS: INTRACRANIAL CONTENTS: The ventricles and sulci are normal for age. No intraparenchymal hemorrhage, mass effect nor midline shift. Patchy supratentorial white matter hypodensities are less than expected for patient's age and though non-specific likely represent chronic small vessel ischemic disease. No acute large vascular territory infarcts. No abnormal extra-axial fluid collections. Basal cisterns are patent. Moderate calcific atherosclerosis of the carotid siphons.  Intravascular residual contrast. ORBITS: The included ocular globes and orbital contents are non-suspicious. Status post bilateral ocular lens implants. SINUSES: The mastoid aircells and included paranasal sinuses are well-aerated. SKULL/SOFT TISSUES: No skull fracture. No significant soft tissue swelling. IMPRESSION: No acute intracranial process, status post cerebral angiogram with residual intravascular contrast. Involutional changes and mild chronic small vessel ischemic disease. Electronically Signed   By: Elon Alas M.D.   On: 07/20/2015 01:31   Ct Head Wo Contrast  07/19/2015  CLINICAL DATA:  82 year old female found out site. Right-sided weakness. Code stroke. EXAM: CT HEAD WITHOUT CONTRAST TECHNIQUE: Contiguous axial images were obtained from the base of the skull through the vertex without intravenous contrast. COMPARISON:  None FINDINGS: There is mild prominence of the ventricles and sulci compatible with age-related atrophy. Moderate periventricular and deep white matter chronic microvascular ischemic changes noted. There is no acute intracranial hemorrhage. No mass effect or midline shift noted. Stop the visualized paranasal sinuses and mastoid air cells are clear. The calvarium is intact. IMPRESSION: No acute intracranial hemorrhage. Age-related atrophy and chronic microvascular ischemic disease. If symptoms persist and there are no contraindications, MRI may provide better evaluation if clinically indicated. Electronically Signed   By: Anner Crete M.D.   On: 07/19/2015 21:56   Ct Angio Neck W/cm &/or Wo/cm  07/19/2015  CLINICAL DATA:  82 year old female code stroke patient with right side weakness. Initial encounter. EXAM: CT ANGIOGRAPHY HEAD AND NECK CT CEREBRAL PERFUSION WITH CONTRAST  TECHNIQUE: Multidetector CT imaging of the head and neck was performed using the standard protocol during bolus administration of intravenous contrast. Multiplanar CT image reconstructions and MIPs  were obtained to evaluate the vascular anatomy. Carotid stenosis measurements (when applicable) are obtained utilizing NASCET criteria, using the distal internal carotid diameter as the denominator. Cerebral CT perfusion was also performed and images were post processed into perfusion maps. CONTRAST:  26mL OMNIPAQUE IOHEXOL 350 MG/ML SOLN COMPARISON:  Noncontrast head CT 2133 hours today. FINDINGS: CT PERFUSION Left MCA territory abnormal mean-transit-time and time-to-peak with associated decreased cerebral blood flow. Cerebral blood volume is relatively preserved, suggesting compensated ischemia and penumbra. On CT profusion source images delayed collateral enhancement in the left MCA territory occurs. Furthermore, on the comparison noncontrast CT (reported separately) I estimate the ASPECTS score at 9 or 10, with little to no CT changes of left MCA ischemia. CTA NECK Skeleton: Osteopenia. No acute osseous abnormality identified. Other neck: Patchy ground-glass opacity with pulmonary septal thickening in the visualized upper lungs. No superior mediastinal lymphadenopathy. Negative thyroid, larynx, pharynx, parapharyngeal spaces, retropharyngeal space, sublingual space, submandibular glands and parotid glands. No cervical lymphadenopathy. Aortic arch: Slight bovine type arch configuration. Minimal arch atherosclerosis. No great vessel origin stenosis. Right carotid system: Tortuous right CCA in the lower neck. Minimal for age atherosclerosis at the right carotid bifurcation. Tortuous cervical right ICA, otherwise negative to the skullbase. Left carotid system: Minimal plaque at the left CCA origin. Tortuous proximal left CCA. Minimal plaque at the left carotid bifurcation which is widely patent. Tortuous cervical left ICA but otherwise negative to the skullbase. See intracranial findings below. Vertebral arteries:No proximal subclavian artery stenosis. Normal vertebral artery origins. Tortuous bilateral V1 segments.  The right vertebral artery is mildly dominant. No vertebral artery stenosis to the skullbase. CTA HEAD Posterior circulation: Non dominant left vertebral artery is diminutive beyond the left PICA origin. Normal right PICA origin. Mild calcified and soft plaque in the distal right V4 segment resulting in mild stenosis. Patent vertebrobasilar junction. Moderate proximal basilar artery irregularity and stenosis. Irregularity continues to the mid basilar artery. The distal basilar artery has a more normal appearance. Normal SCA and right PCA origins. Fetal type left PCA origin with enhancing left posterior communicating artery. The right posterior communicating artery is diminutive or absent. Bilateral PCA branches are irregular but patent. Anterior circulation: Patent right ICA siphon with calcified plaque. Mild right siphon stenosis. Patent right ICA terminus. Right MCA M1 segment is patent with mild to moderate irregularity. Right MCA bifurcation is patent with moderate irregularity which continues into the right MCA branches. Proximal left ICA siphon is patent but there is thrombus at the left ICA terminus extending from the level of the posterior communicating artery to the left MCA and ACA origins. Thrombus in both left M1 and A1 segments with reconstituted flow. Furthermore, the left A1 segment appears dominant. There is reconstituted flow at the anterior communicating artery and in the bilateral ACA branches which are irregular but patent. There is poor to intermediate degree of early reconstituted flow at the left MCA bifurcation. Attenuated left MCA M2 branches. Venous sinuses: Appear patent, but suboptimal venous contrast timing. Anatomic variants: Suspected dominant left ACA A1 segment. IMPRESSION: 1. Positive for emergent large vessel occlusion at the left ICA terminus. Thrombus from the level of the left posterior communicating artery extending into both the left M1 and A1 segments. Some early  reconstituted left MCA and ACA enhancement. 2. Large area of abnormal left MCA perfusion  characteristics, but with relatively preserved cerebral blood volume and delayed collateral enhancement on the perfusion source images. 3. In conjunction with the noncontrast head CT (reported separately) the above constellation is favorable for endovascular reperfusion. This was discussed by telephone with Dr. Wallie Char on 07/19/2015 at 2156 hours. 4. No superimposed arterial stenosis in the neck. Tortuous bilateral carotid arteries. 5. Superimposed intracranial atherosclerosis elsewhere including moderate proximal basilar stenosis, mild stenosis of the dominant distal right vertebral artery, and mild to moderate bilateral PCA and right MCA branch irregularity. 6. Bilateral upper lobe pulmonary ground-glass opacity and septal thickening may indicate developing interstitial edema. Electronically Signed   By: Genevie Ann M.D.   On: 07/19/2015 22:31   Ct Cerebral Perfusion W/cm  07/19/2015  CLINICAL DATA:  82 year old female code stroke patient with right side weakness. Initial encounter. EXAM: CT ANGIOGRAPHY HEAD AND NECK CT CEREBRAL PERFUSION WITH CONTRAST TECHNIQUE: Multidetector CT imaging of the head and neck was performed using the standard protocol during bolus administration of intravenous contrast. Multiplanar CT image reconstructions and MIPs were obtained to evaluate the vascular anatomy. Carotid stenosis measurements (when applicable) are obtained utilizing NASCET criteria, using the distal internal carotid diameter as the denominator. Cerebral CT perfusion was also performed and images were post processed into perfusion maps. CONTRAST:  66mL OMNIPAQUE IOHEXOL 350 MG/ML SOLN COMPARISON:  Noncontrast head CT 2133 hours today. FINDINGS: CT PERFUSION Left MCA territory abnormal mean-transit-time and time-to-peak with associated decreased cerebral blood flow. Cerebral blood volume is relatively preserved, suggesting  compensated ischemia and penumbra. On CT profusion source images delayed collateral enhancement in the left MCA territory occurs. Furthermore, on the comparison noncontrast CT (reported separately) I estimate the ASPECTS score at 9 or 10, with little to no CT changes of left MCA ischemia. CTA NECK Skeleton: Osteopenia. No acute osseous abnormality identified. Other neck: Patchy ground-glass opacity with pulmonary septal thickening in the visualized upper lungs. No superior mediastinal lymphadenopathy. Negative thyroid, larynx, pharynx, parapharyngeal spaces, retropharyngeal space, sublingual space, submandibular glands and parotid glands. No cervical lymphadenopathy. Aortic arch: Slight bovine type arch configuration. Minimal arch atherosclerosis. No great vessel origin stenosis. Right carotid system: Tortuous right CCA in the lower neck. Minimal for age atherosclerosis at the right carotid bifurcation. Tortuous cervical right ICA, otherwise negative to the skullbase. Left carotid system: Minimal plaque at the left CCA origin. Tortuous proximal left CCA. Minimal plaque at the left carotid bifurcation which is widely patent. Tortuous cervical left ICA but otherwise negative to the skullbase. See intracranial findings below. Vertebral arteries:No proximal subclavian artery stenosis. Normal vertebral artery origins. Tortuous bilateral V1 segments. The right vertebral artery is mildly dominant. No vertebral artery stenosis to the skullbase. CTA HEAD Posterior circulation: Non dominant left vertebral artery is diminutive beyond the left PICA origin. Normal right PICA origin. Mild calcified and soft plaque in the distal right V4 segment resulting in mild stenosis. Patent vertebrobasilar junction. Moderate proximal basilar artery irregularity and stenosis. Irregularity continues to the mid basilar artery. The distal basilar artery has a more normal appearance. Normal SCA and right PCA origins. Fetal type left PCA origin  with enhancing left posterior communicating artery. The right posterior communicating artery is diminutive or absent. Bilateral PCA branches are irregular but patent. Anterior circulation: Patent right ICA siphon with calcified plaque. Mild right siphon stenosis. Patent right ICA terminus. Right MCA M1 segment is patent with mild to moderate irregularity. Right MCA bifurcation is patent with moderate irregularity which continues into the right MCA branches. Proximal  left ICA siphon is patent but there is thrombus at the left ICA terminus extending from the level of the posterior communicating artery to the left MCA and ACA origins. Thrombus in both left M1 and A1 segments with reconstituted flow. Furthermore, the left A1 segment appears dominant. There is reconstituted flow at the anterior communicating artery and in the bilateral ACA branches which are irregular but patent. There is poor to intermediate degree of early reconstituted flow at the left MCA bifurcation. Attenuated left MCA M2 branches. Venous sinuses: Appear patent, but suboptimal venous contrast timing. Anatomic variants: Suspected dominant left ACA A1 segment. IMPRESSION: 1. Positive for emergent large vessel occlusion at the left ICA terminus. Thrombus from the level of the left posterior communicating artery extending into both the left M1 and A1 segments. Some early reconstituted left MCA and ACA enhancement. 2. Large area of abnormal left MCA perfusion characteristics, but with relatively preserved cerebral blood volume and delayed collateral enhancement on the perfusion source images. 3. In conjunction with the noncontrast head CT (reported separately) the above constellation is favorable for endovascular reperfusion. This was discussed by telephone with Dr. Wallie Char on 07/19/2015 at 2156 hours. 4. No superimposed arterial stenosis in the neck. Tortuous bilateral carotid arteries. 5. Superimposed intracranial atherosclerosis elsewhere  including moderate proximal basilar stenosis, mild stenosis of the dominant distal right vertebral artery, and mild to moderate bilateral PCA and right MCA branch irregularity. 6. Bilateral upper lobe pulmonary ground-glass opacity and septal thickening may indicate developing interstitial edema. Electronically Signed   By: Genevie Ann M.D.   On: 07/19/2015 22:31   Dg Chest Port 1 View  07/20/2015  CLINICAL DATA:  82 year old female status post intubation. EXAM: PORTABLE CHEST 1 VIEW COMPARISON:  Chest radiograph dated 09/11/2009 FINDINGS: An endotracheal tube is noted with tip approximately 5 cm above the carina. Patchy areas of hazy density at the left lung base and perihilar region are concerning for pneumonia. There is diffuse interstitial prominence, left greater right which may be related to underlying congestive changes or edema. Trace fluid noted in the right minor fissure. No significant pleural effusion. No pneumothorax. Mild cardiomegaly. No acute osseous pathology. IMPRESSION: Left lung base and right hilar hazy densities concerning for pneumonia superimposed on background of congestion/edema. Clinical correlation and follow-up recommended. Endotracheal tube above the carina. Electronically Signed   By: Anner Crete M.D.   On: 07/20/2015 06:46   EVENTS: 3/25 - Admit  3/26 - Revascularization in IR   STUDIES: CT HEAD W/O 3/25: No intracranial hemorrhage. Age-related atrophy & critical microvascular ischemic disease. CTA HEAD/NECK 3/25: Emergent large vessel occlusion at the L ICA terminus. Thrombus from left PICA extending into both M1 & A1 segments. Large area of L MCA perfusion abnormal. No arterial stenosis in neck. Bilateral upper lobe ground glass in the lung & septal thickening. PORT CXR 3/26: Right hilar fullness. Left hilum is somewhat obscured by the heart. Appears to have blunting of left costal cardiac angle suggesting opacification. Endotracheal tube in acceptable  position.  MICROBIOLOGY: MRSA PCR 3/26: Negative  ANTIBIOTICS: Vancomycin 3/25 (surgical prophylaxis)  LINES/TUBES: OETT 7.5 3/25>>> Foley 3/25>>> PIV x2  ASSESSMENT / PLAN:  NEUROLOGIC A:   S/p revascularization of Lt ICA, MCA, ACA S/p CVA Residual anesthesia effects likely causing absent neuro exam Sedation on Ventilator  P:   Neurology & IR following RASS goal: 0 Q2 Neuro check Fentanyl IV prn  PULMONARY A: Intubated for airway protection during procedure H/O Asthma  P:  Full Vent Support SBT pending neuro evaluation ETT advanced 1cm  CARDIOVASCULAR A:  Atrial Fibrillation - No RVR. Bradycardia Total occlusion of LT ICA, MCA, ACA H/O HTN  P:  Monitor on telemetry Vitals per unit protocol Cardene gtt to maintain SBP 120-140  RENAL A:   Hypokalemia - Mild.  P:   KCl 54mEq IV x4 runs Monitor UOP with foley Trending renal function & electrolytes daily  GASTROINTESTINAL A:  No acute issues.  P:   NPO Protonix IV daily  HEMATOLOGIC A:   Thrombocytopenia - Mild.  P:  Trending cell counts daily w/ CBC SCDs  INFECTIOUS A:    No acute issues.  P: Monitor for signs of infection. Post surgical abx prophylaxis  ENDOCRINE A:   Hyperglycemia  - No h/o DM.  P:   Hgb A1c pending Accu-Checks q4hr Low dose SSI per algorithm  FAMILY DISCUSSION:  No family at bedside.  TODAY'S SUMMARY:  82 yo female s/p CVA 2/2 total occlusion of Left ICA, MCA, ACA s/p revascularization by IR & TPA. Continue ventilator and plan for SBT & possible extubation in the morning.   I have spent an additional 27 minutes of critical care time today caring for the patient and reviewing the patient's electronic medical record.  Sonia Baller Ashok Cordia, M.D. [facility] Pulmonary & Critical Care Pager:  [phone] After 3pm or if no response, call [phone] 07/20/2015, 7:41 AM

## 2015-07-20 NOTE — Progress Notes (Signed)
[place] Progress Note Patient Name: Joy Holden DOB: [birth year] MRN: [id number]   Date of Service  07/20/2015  HPI/Events of Note  Agitation.  eICU Interventions  Will order Fentanyl 25 - 100 mcg IV Q 2 hours PRN.     Intervention Category Minor Interventions: Agitation / anxiety - evaluation and management  [name] 07/20/2015, 5:09 AM

## 2015-07-20 NOTE — Progress Notes (Signed)
ETT advanced 1cm per MD order.  ETT holder also placed on patient without any complications.

## 2015-07-20 NOTE — Procedures (Signed)
S/P Lt CCA arteriogram,followed by complete revascularization of T occlusion of LT ICA ,LT MCA and Lt ACA with x 1pass with Solitaire 4 mm x 40 mm retrieval device and 5 mg of superselective IA TPA with TICI 2b revascularization

## 2015-07-20 NOTE — Progress Notes (Signed)
Notified Dr Nicole Kindred of MRI results 24hrs post tPA. Order received for ASA suppository.

## 2015-07-20 NOTE — Progress Notes (Signed)
Report and denture cup with pt's jewelry given to [employer] in [place].

## 2015-07-20 NOTE — Progress Notes (Signed)
Stroke mapping booklet given to nephew

## 2015-07-21 ENCOUNTER — Inpatient Hospital Stay (HOSPITAL_COMMUNITY): Payer: PPO

## 2015-07-21 ENCOUNTER — Encounter (HOSPITAL_COMMUNITY): Payer: Self-pay | Admitting: Interventional Radiology

## 2015-07-21 DIAGNOSIS — I639 Cerebral infarction, unspecified: Secondary | ICD-10-CM

## 2015-07-21 DIAGNOSIS — J9620 Acute and chronic respiratory failure, unspecified whether with hypoxia or hypercapnia: Secondary | ICD-10-CM

## 2015-07-21 DIAGNOSIS — E785 Hyperlipidemia, unspecified: Secondary | ICD-10-CM

## 2015-07-21 DIAGNOSIS — I6789 Other cerebrovascular disease: Secondary | ICD-10-CM

## 2015-07-21 DIAGNOSIS — I482 Chronic atrial fibrillation: Secondary | ICD-10-CM

## 2015-07-21 DIAGNOSIS — I63132 Cerebral infarction due to embolism of left carotid artery: Secondary | ICD-10-CM

## 2015-07-21 DIAGNOSIS — I10 Essential (primary) hypertension: Secondary | ICD-10-CM

## 2015-07-21 LAB — CBC WITH DIFFERENTIAL/PLATELET
BASOS ABS: 0 10*3/uL (ref 0.0–0.1)
Basophils Relative: 0 %
Eosinophils Absolute: 0 10*3/uL (ref 0.0–0.7)
Eosinophils Relative: 0 %
HEMATOCRIT: 40.4 % (ref 36.0–46.0)
HEMOGLOBIN: 13.1 g/dL (ref 12.0–15.0)
LYMPHS PCT: 18 %
Lymphs Abs: 1.6 10*3/uL (ref 0.7–4.0)
MCH: 30.4 pg (ref 26.0–34.0)
MCHC: 32.4 g/dL (ref 30.0–36.0)
MCV: 93.7 fL (ref 78.0–100.0)
Monocytes Absolute: 1.2 10*3/uL — ABNORMAL HIGH (ref 0.1–1.0)
Monocytes Relative: 13 %
NEUTROS ABS: 6.4 10*3/uL (ref 1.7–7.7)
NEUTROS PCT: 69 %
Platelets: 122 10*3/uL — ABNORMAL LOW (ref 150–400)
RBC: 4.31 MIL/uL (ref 3.87–5.11)
RDW: 13.2 % (ref 11.5–15.5)
WBC: 9.3 10*3/uL (ref 4.0–10.5)

## 2015-07-21 LAB — RENAL FUNCTION PANEL
ALBUMIN: 3.2 g/dL — AB (ref 3.5–5.0)
Anion gap: 11 (ref 5–15)
BUN: 21 mg/dL — ABNORMAL HIGH (ref 6–20)
CHLORIDE: 113 mmol/L — AB (ref 101–111)
CO2: 20 mmol/L — ABNORMAL LOW (ref 22–32)
CREATININE: 0.74 mg/dL (ref 0.44–1.00)
Calcium: 8.7 mg/dL — ABNORMAL LOW (ref 8.9–10.3)
Glucose, Bld: 121 mg/dL — ABNORMAL HIGH (ref 65–99)
PHOSPHORUS: 3.3 mg/dL (ref 2.5–4.6)
POTASSIUM: 4 mmol/L (ref 3.5–5.1)
Sodium: 144 mmol/L (ref 135–145)

## 2015-07-21 LAB — HEMOGLOBIN A1C
Hgb A1c MFr Bld: 7.1 % — ABNORMAL HIGH (ref 4.8–5.6)
MEAN PLASMA GLUCOSE: 157 mg/dL

## 2015-07-21 LAB — GLUCOSE, CAPILLARY
GLUCOSE-CAPILLARY: 111 mg/dL — AB (ref 65–99)
GLUCOSE-CAPILLARY: 85 mg/dL (ref 65–99)
GLUCOSE-CAPILLARY: 112 mg/dL — AB (ref 65–99)
Glucose-Capillary: 92 mg/dL (ref 65–99)
Glucose-Capillary: 107 mg/dL — ABNORMAL HIGH (ref 65–99)
Glucose-Capillary: 92 mg/dL (ref 65–99)

## 2015-07-21 LAB — ECHOCARDIOGRAM COMPLETE
Height: 62 in
WEIGHTICAEL: 2479.73 [oz_av]

## 2015-07-21 LAB — MAGNESIUM: MAGNESIUM: 2 mg/dL (ref 1.7–2.4)

## 2015-07-21 MED ORDER — FENTANYL CITRATE (PF) 100 MCG/2ML IJ SOLN
25.0000 ug | INTRAMUSCULAR | Status: DC | PRN
  Administered 2015-07-22: 50 ug via INTRAVENOUS
  Filled 2015-07-21: qty 2

## 2015-07-21 MED ORDER — VITAL HIGH PROTEIN PO LIQD
1000.0000 mL | ORAL | Status: DC
Start: 2015-07-21 — End: 2015-07-22
  Administered 2015-07-21: 1000 mL
  Administered 2015-07-22 (×2)

## 2015-07-21 MED ORDER — ASPIRIN 300 MG RE SUPP
300.0000 mg | Freq: Every day | RECTAL | Status: DC
  Administered 2015-07-21 – 2015-07-22 (×2): 300 mg via RECTAL
  Filled 2015-07-21 (×3): qty 1

## 2015-07-21 NOTE — Clinical Documentation Improvement (Signed)
Neurology Critical Care  From the choices below, can the diagnosis of "history of Atrial Fibrillation be further specified? Please document findings in next progress note. Thank you!   Chronic Atrial fibrillation  Paroxysmal Atrial fibrillation  Permanent Atrial fibrillation  Persistent Atrial fibrillation  Other  Clinically Undetermined  Document any associated diagnoses/conditions.  Supporting Information:  Allergy to ASA  Was not on any anticoagulation or antiplatelet therapy - H&P under HPI  Please exercise your independent, professional judgment when responding. A specific answer is not anticipated or expected.  Thank You,  Zoila Shutter RN, BSN, [place] [phone]; Cell: [phone]

## 2015-07-21 NOTE — Progress Notes (Signed)
STROKE TEAM PROGRESS NOTE   SUBJECTIVE (INTERVAL HISTORY) Her RN is at the bedside.  Overall she feels her condition is stable. She still intubated, able to open eyes on voice but not following commands. MRI showed left MCA and ACA infarcts. Put on ASA.    OBJECTIVE Temp:  [97.5 F (36.4 C)-99.6 F (37.6 C)] 97.5 F (36.4 C) (03/27 1200) Pulse Rate:  [51-97] 65 (03/27 1300) Cardiac Rhythm:  [-] Atrial fibrillation (03/27 0800) Resp:  [14-35] 15 (03/27 1300) BP: (115-186)/(59-101) 163/69 mmHg (03/27 1300) SpO2:  [98 %-100 %] 100 % (03/27 1300) FiO2 (%):  [30 %] 30 % (03/27 1300)  CBC:   Recent Labs Lab 07/20/15 0450 07/21/15 0332  WBC 6.8 9.3  NEUTROABS 5.4 6.4  HGB 12.6 13.1  HCT 39.3 40.4  MCV 93.6 93.7  PLT 117* 122*    Basic Metabolic Panel:   Recent Labs Lab 07/20/15 0450 07/21/15 0332  NA 141 144  K 3.2* 4.0  CL 108 113*  CO2 22 20*  GLUCOSE 197* 121*  BUN 20 21*  CREATININE 0.76 0.74  CALCIUM 8.4* 8.7*  MG  --  2.0  PHOS  --  3.3    Lipid Panel:     Component Value Date/Time   CHOL 129 07/20/2015 0350   TRIG 33 07/20/2015 0350   HDL 48 07/20/2015 0350   CHOLHDL 2.7 07/20/2015 0350   VLDL 7 07/20/2015 0350   LDLCALC 74 07/20/2015 0350   HgbA1c:  Lab Results  Component Value Date   HGBA1C 7.1* 07/20/2015   Urine Drug Screen:     Component Value Date/Time   LABOPIA NONE DETECTED 07/19/2015 2233   COCAINSCRNUR NONE DETECTED 07/19/2015 2233   LABBENZ NONE DETECTED 07/19/2015 2233   AMPHETMU NONE DETECTED 07/19/2015 2233   THCU NONE DETECTED 07/19/2015 2233   LABBARB NONE DETECTED 07/19/2015 2233      IMAGING I have personally reviewed the radiological images below and agree with the radiology interpretations.  Ct Angio Head and Neck W/cm &/or Wo Cm 07/19/2015   1. Positive for emergent large vessel occlusion at the left ICA terminus. Thrombus from the level of the left posterior communicating artery extending into both the left M1 and  A1 segments. Some early reconstituted left MCA and ACA enhancement.  2. Large area of abnormal left MCA perfusion characteristics, but with relatively preserved cerebral blood volume and delayed collateral enhancement on the perfusion source images.  3. In conjunction with the noncontrast head CT (reported separately) the above constellation is favorable for endovascular reperfusion.  4. No superimposed arterial stenosis in the neck. Tortuous bilateral carotid arteries.  5. Superimposed intracranial atherosclerosis elsewhere including moderate proximal basilar stenosis, mild stenosis of the dominant distal right vertebral artery, and mild to moderate bilateral PCA and right MCA branch irregularity.  6. Bilateral upper lobe pulmonary ground-glass opacity and septal thickening may indicate developing interstitial edema.   Ct Cerebral Perfusion W/cm 07/19/2015   1. Positive for emergent large vessel occlusion at the left ICA terminus. Thrombus from the level of the left posterior communicating artery extending into both the left M1 and A1 segments. Some early reconstituted left MCA and ACA enhancement.  2. Large area of abnormal left MCA perfusion characteristics, but with relatively preserved cerebral blood volume and delayed collateral enhancement on the perfusion source images.  3. In conjunction with the noncontrast head CT (reported separately) the above constellation is favorable for endovascular reperfusion.  4. No superimposed arterial stenosis in the  neck. Tortuous bilateral carotid arteries.  5. Superimposed intracranial atherosclerosis elsewhere including moderate proximal basilar stenosis, mild stenosis of the dominant distal right vertebral artery, and mild to moderate bilateral PCA and right MCA branch irregularity.  6. Bilateral upper lobe pulmonary ground-glass opacity and septal thickening may indicate developing interstitial edema.   Ct Head Wo Contrast 07/20/2015   No acute  intracranial process, status post cerebral angiogram with residual intravascular contrast. Involutional changes and mild chronic small vessel ischemic disease.   Ct Head Wo Contrast 07/19/2015   No acute intracranial hemorrhage. Age-related atrophy and chronic microvascular ischemic disease. If symptoms persist and there are no contraindications, MRI may provide better evaluation if clinically indicated.    Dg Chest Port 1 View 07/20/2015   Left lung base and right hilar hazy densities concerning for pneumonia superimposed on background of congestion/edema. Clinical correlation and follow-up recommended. Endotracheal tube above the carina.   Mr Brain Wo Contrast 07/20/2015  IMPRESSION: Acute large LEFT anterior cerebral artery territory infarct. Acute small LEFT middle cerebral artery territory infarct, predominately involving the basal ganglia and LEFT frontal lobe. 3 mm LEFT-to-RIGHT midline shift. Moderate chronic small vessel ischemic disease and old small LEFT cerebellar infarct.   2D echo - - Left ventricle: The cavity size was normal. There was mild  concentric hypertrophy. Systolic function was vigorous. The  estimated ejection fraction was in the range of 65% to 70%. - Aortic valve: Valve area (VTI): 1.51 cm^2. Valve area (Vmax):  1.74 cm^2. Valve area (Vmean): 1.74 cm^2. - Mitral valve: There was mild regurgitation. - Left atrium: The atrium was mildly dilated. - Right atrium: The atrium was mildly dilated. - Atrial septum: No defect or patent foramen ovale was identified.   PHYSICAL EXAM  Temp:  [97.5 F (36.4 C)-99.6 F (37.6 C)] 97.5 F (36.4 C) (03/27 1200) Pulse Rate:  [51-97] 65 (03/27 1300) Resp:  [14-35] 15 (03/27 1300) BP: (115-186)/(59-101) 163/69 mmHg (03/27 1300) SpO2:  [98 %-100 %] 100 % (03/27 1300) FiO2 (%):  [30 %] 30 % (03/27 1300)  General - Well nourished, well developed, intubated not on sedation.  Ophthalmologic - Fundi not visualized due to  incooperation.  Cardiovascular - irregularly irregular heart rate and rhythm.  Neuro - intubated not on sedation, able to open eyes on voice, but not following commands. PERRL, left eye gaze preference, not blinking to visual threat with forced eye opening. Dolls eye present, corneal and gag present, right hemiplegia, 0/5 RUE and RLE, left UE and LE spontaneous movement against gravity. DTR 1+, no babinski. Sensation, coordination and gait not tested.   ASSESSMENT/PLAN Ms. EBONIE GRISSETT is a 82 y.o. female with history of afib not on AC, HTN, asthma presenting with speech abnormality, right facial droop, and right hemiplegia.  She did  receive IV TPA Saturday, 07/19/2015 at 2215.  Stroke:  Dominant left MCA and ACA territory infarct likely embolic secondary to afib not on AC, s/p tPA and mechanical thrombectomy with TICI2b revasculization.  Resultant  Right hemiplegia and left gaze preference  MRI left MCA and ACA territory infarcts  CTA head and neck - left terminal ICA thrombus, bilateral siphon stenosis, right M1 stenosis   2D Echo  EF 65-70%  LDL 74  HgbA1c pending  VTE prophylaxis - lovenox  No antithrombotic prior to admission, now on ASA PR 300mg . Due to moderate to large size of stroke, will initiate AC in 5-7 days post stroke if no procedure planned.  Patient counseled to be  compliant with her antithrombotic medications  Ongoing aggressive stroke risk factor management  Therapy recommendations:  Pending  Disposition: Pending  Chronic afib  Not on AC PTA  Due to moderate to large size of stroke, will initiate AC in 5-7 days post stroke if no procedure planned.  Home cardiazem  Respiratory failure  Intubated  CCM on board  Extubate as able  Hypertension  Blood pressure elevated at times.  Permissive hypertension (OK if < 180/105) but gradually normalize in 5-7 days  Consider PO meds once po access.  Hyperlipidemia  Home meds:  No lipid lowering  medications PTA  LDL 74, goal < 70  Initiate statin once po access  Other Stroke Risk Factors  Advanced age  Atrial fibrillation without anticoagulation  Other Active Problems  Mild thrombocytopenia  Hypokalemia - supplement   Hospital day # 2  This patient is critically ill due to left MCA infarct, left ICA occlusion s/p tPA and mechanical thrombectomy, respiratory failure, and afib and at significant risk of neurological worsening, death form recurrent stroke, hemorrhagic transformation, heart failure and seizure. This patient's care requires constant monitoring of vital signs, hemodynamics, respiratory and cardiac monitoring, review of multiple databases, neurological assessment, discussion with family, other specialists and medical decision making of high complexity. I spent 40 minutes of neurocritical care time in the care of this patient.  Joy Hawking, MD PhD Stroke Neurology 07/21/2015 2:27 PM    To contact Stroke Continuity provider, please refer to [email or web address] After hours, contact General Neurology

## 2015-07-21 NOTE — Progress Notes (Signed)
OT Cancellation Note  Patient Details Name: Joy Holden MRN: [id number] DOB: Jun 26, 1924   Cancelled Treatment:    Reason Eval/Treat Not Completed: Patient not medically ready - Pt with strict bedrest orders.  Will reattempt.   Darlina Rumpf [place], OTR/L [id number]  07/21/2015, 9:55 AM

## 2015-07-21 NOTE — Progress Notes (Signed)
SLP Cancellation Note  Patient Details Name: Joy Holden MRN: [id number] DOB: 02-Mar-1925   Cancelled treatment:       Reason Eval/Treat Not Completed: Patient not medically ready . Pt remains intubated, will sign off  Sandrina Heaton, Katherene Ponto 07/21/2015, 9:43 AM

## 2015-07-21 NOTE — Care Management Note (Signed)
Case Management Note  Patient Details  Name: Joy Holden MRN: [id number] Date of Birth: [birth year]  Subjective/Objective:     Pt admitted on 07/19/15 s/p Lt MCA and ACA infarcts.  PTA, pt independent of ADLS; still driving.   Action/Plan: Pt remains sedated and on ventilator.  Will follow for discharge planning as pt progresses.    Expected Discharge Date:                  Expected Discharge Plan:  [place]  In-House Referral:     Discharge planning Services  CM Consult  Post Acute Care Choice:    Choice offered to:     DME Arranged:    DME Agency:     HH Arranged:    [place] Agency:     Status of Service:  In process, will continue to follow  Medicare Important Message Given:    Date Medicare IM Given:    Medicare IM give by:    Date Additional Medicare IM Given:    Additional Medicare Important Message give by:     If discussed at [place] of Stay Meetings, dates discussed:    Additional Comments:  Reinaldo Raddle, RN, BSN  Trauma/Neuro ICU Case Manager [phone]

## 2015-07-21 NOTE — Progress Notes (Signed)
PULMONARY / CRITICAL CARE MEDICINE   Name: Joy Holden MRN: [id number] DOB: 19-Nov-1924    ADMISSION DATE:  07/19/2015 CONSULTATION DATE:  07/20/2015  REFERRING MD :  Dr. Nicole Kindred  CHIEF COMPLAINT:  Dysarthria  HISTORY OF PRESENT ILLNESS:    82 y.o. female history of atrial fibrillation and hypertension brought to the emergency room following acute onset of speech abnormality, right facial droop and right hemiplegia. Patient was driving at the time of onset and was  Last normal about 8:15 PM on 07/19/2015.  She reportedly is allergic to aspirin.   In the ED CT head showed no acute intracranial abnormality. CT angiogram however showed occlusion of left ICA terminus with thrombus extending from the level of posterior indicating artery both M1 and A1 segments. She recived  TPA with no change in deficits. Her NIH stroke score was 26.   She was taken to interventional radiology and underwent revascularization of total occlusion of LT ICA, MCA, ACA for further management. She had a Solitaire 4 mm x 40 mm retrieval device and 5 mg of superselective IA TPA Blood pressure was elevated, requiring intervention with hydralazine IV.  SUBJECTIVE:  Intubated No obvious pain afebrile Poor UO  REVIEW OF SYSTEMS:  Unable to obtain given intubation and sedation.  VITAL SIGNS: Temp:  [97.9 F (36.6 C)-99.6 F (37.6 C)] 97.9 F (36.6 C) (03/27 0800) Pulse Rate:  [51-97] 76 (03/27 1000) Resp:  [14-18] 14 (03/27 1000) BP: (112-186)/(58-101) 159/101 mmHg (03/27 1000) SpO2:  [99 %-100 %] 100 % (03/27 1000) Arterial Line BP: (101-150)/(53-102) 140/60 mmHg (03/26 1230) FiO2 (%):  [30 %] 30 % (03/27 1000) HEMODYNAMICS:   VENTILATOR SETTINGS: Vent Mode:  [-] PSV;PRVC FiO2 (%):  [30 %] 30 % Set Rate:  [15 bmp] 15 bmp Vt Set:  [420 mL] 420 mL PEEP:  [5 cmH20] 5 cmH20 Pressure Support:  [10 cmH20] 10 cmH20 Plateau Pressure:  [15 cmH20-18 cmH20] 18 cmH20 INTAKE / OUTPUT:  Intake/Output Summary (Last  24 hours) at 07/21/15 1015 Last data filed at 07/21/15 1000  Gross per 24 hour  Intake   1800 ml  Output    605 ml  Net   1195 ml    PHYSICAL EXAMINATION: General:  Sedated. No family at bedside.  Integument:  Warm & dry. No rash on exposed skin.  HEENT:  Endotracheal tube in place. No scleral icterus or injection. Cardiovascular:  Regular rate. No edema. No appreciable JVD.  Pulmonary:  Clear bilaterally to auscultation bilaterally. Symmetric chest wall rise on ventilator. Abdomen: Soft. Normal bowel sounds. Nondistended.  Neurological:  Spontaneously moving left side, decreased on right. Pupils equal.   LABS:  CBC  Recent Labs Lab 07/19/15 2121 07/19/15 2126 07/20/15 0450 07/21/15 0332  WBC 5.9  --  6.8 9.3  HGB 14.4 16.0* 12.6 13.1  HCT 44.0 47.0* 39.3 40.4  PLT 138*  --  117* 122*   Coag's  Recent Labs Lab 07/19/15 2121  APTT 31  INR 1.20   BMET  Recent Labs Lab 07/19/15 2121 07/19/15 2126 07/20/15 0450 07/21/15 0332  NA 141 143 141 144  K 3.8 3.7 3.2* 4.0  CL 108 106 108 113*  CO2 23  --  22 20*  BUN 21* 26* 20 21*  CREATININE 0.83 0.80 0.76 0.74  GLUCOSE 140* 134* 197* 121*   Electrolytes  Recent Labs Lab 07/19/15 2121 07/20/15 0450 07/21/15 0332  CALCIUM 9.3 8.4* 8.7*  MG  --   --  2.0  PHOS  --   --  3.3   Sepsis Markers No results for input(s): LATICACIDVEN, PROCALCITON, O2SATVEN in the last 168 hours. ABG  Recent Labs Lab 07/20/15 0400  PHART 7.456*  PCO2ART 32.4*  PO2ART 65.5*   Liver Enzymes  Recent Labs Lab 07/19/15 2121 07/21/15 0332  AST 25  --   ALT 26  --   ALKPHOS 107  --   BILITOT 0.7  --   ALBUMIN 4.2 3.2*   Cardiac Enzymes No results for input(s): TROPONINI, PROBNP in the last 168 hours. Glucose  Recent Labs Lab 07/20/15 1152 07/20/15 1546 07/20/15 1911 07/20/15 2315 07/21/15 0411 07/21/15 0850  GLUCAP 130* 111* 127* 64 92 107*    Imaging Mr Brain Wo Contrast  07/20/2015  CLINICAL DATA:   Stroke, status post revascularization of LEFT internal carotid artery and LEFT anterior and middle cerebral arteries. History of atrial fibrillation, hypertension and cancer. EXAM: MRI HEAD WITHOUT CONTRAST TECHNIQUE: Multiplanar, multiecho pulse sequences of the brain and surrounding structures were obtained without intravenous contrast. COMPARISON:  CT head July 20, 2015 FINDINGS: Confluent reduced diffusion LEFT frontal lobe, predominately medial. Patchy reduced diffusion LEFT mesial temporal lobe LEFT basal ganglia, LEFT posterior frontal lobe with corresponding low ADC values and bright T2 FLAIR. 3 mm LEFT-to-RIGHT midline shift. No susceptibility artifact to suggest hemorrhage. Ventricles and sulci are overall normal for patient's age. Patchy FLAIR T2 hyperintense signal exclusive of the aforementioned abnormality. Old Small LEFT cerebellar infarct. No abnormal extra-axial fluid collections. Normal major intracranial vascular flow voids present at skull base. Status post bilateral ocular lens implants. No abnormal sellar expansion. No cerebellar tonsillar ectopia. No suspicious calvarial bone marrow signal. IMPRESSION: Acute large LEFT anterior cerebral artery territory infarct. Acute small LEFT middle cerebral artery territory infarct, predominately involving the basal ganglia and LEFT frontal lobe. 3 mm LEFT-to-RIGHT midline shift. Moderate chronic small vessel ischemic disease and old small LEFT cerebellar infarct. These results will be called to the ordering clinician or representative by the Radiologist Assistant, and communication documented in the PACS or zVision Dashboard. Electronically Signed   By: Elon Alas M.D.   On: 07/20/2015 22:25   EVENTS: 3/25 - Admit  3/26 - Revascularization in IR   STUDIES: CT HEAD W/O 3/25: No intracranial hemorrhage. Age-related atrophy & critical microvascular ischemic disease. CTA HEAD/NECK 3/25: Emergent large vessel occlusion at the L ICA terminus.  Thrombus from left PICA extending into both M1 & A1 segments. Large area of L MCA perfusion abnormal. No arterial stenosis in neck. Bilateral upper lobe ground glass in the lung & septal thickening. MRI 3/26 >>Acute large LEFT anterior cerebral artery territory infarct. Acute small LEFT middle cerebral artery territory infarct, 3 mm LEFT-to-RIGHT midline shift.   MICROBIOLOGY: MRSA PCR 3/26: Negative  ANTIBIOTICS: Vancomycin 3/25 (surgical prophylaxis)  LINES/TUBES: OETT 7.5 3/25>>> Foley 3/25>>> PIV x2  ASSESSMENT / PLAN:  NEUROLOGIC A:   S/p revascularization of Lt ICA, MCA, ACA S/p CVA Residual anesthesia effects likely causing absent neuro exam   P:   Neurology & IR following RASS goal: 0 Q2 Neuro check Fentanyl IV prn  PULMONARY A: Post op resp failure - ETT adjsuted H/O Asthma  P:   SBTs with goal extubation when fully awake   CARDIOVASCULAR A:  Atrial Fibrillation - No RVR. Bradycardia Total occlusion of LT ICA, MCA, ACA H/O HTN  P:  Off  Cardene gtt   RENAL A:   Hypokalemia - Mild.  P:    Monitor UOP  with foley Trending renal function & replete electrolytes daily as needed  GASTROINTESTINAL A:  No acute issues.  P:   NPO Protonix IV daily TFs if not extuabted today  HEMATOLOGIC A:   Thrombocytopenia - Mild.  P:  Trending cell counts daily w/ CBC SCDs  INFECTIOUS A:    No acute issues.  P: Monitor for signs of infection. Post surgical abx prophylaxis  ENDOCRINE A:   Hyperglycemia  - No h/o DM.  P:   Hgb A1c pending Accu-Checks q4hr SSI per algorithm  FAMILY DISCUSSION:  No family at bedside.  TODAY'S SUMMARY:  82 yo female s/p CVA 2/2 total occlusion of Left ICA, MCA, ACA s/p revascularization by IR & TPA. Possible extubation today when more awake  The patient is critically ill with multiple organ systems failure and requires high complexity decision making for assessment and support, frequent evaluation and  titration of therapies, application of advanced monitoring technologies and extensive interpretation of multiple databases. Critical Care Time devoted to patient care services described in this note independent of APP time is 31 minutes.   Kara Mead MD. [place]. [place] [facility] Pager [phone] If no response call [phone]   07/21/2015      07/21/2015, 10:15 AM

## 2015-07-21 NOTE — Progress Notes (Signed)
  Echocardiogram 2D Echocardiogram has been performed.  Joy Holden 07/21/2015, 11:52 AM

## 2015-07-21 NOTE — Progress Notes (Signed)
Referring Physician(s): Dr Rosalin Hawking  Supervising Physician: Dr Luanne Bras  Chief Complaint:  L ICA; MCA; ACA clot retrieval 3/26 -- Dr Estanislado Pandy  Subjective:  On vent Does seem to try to respond and attempts to open eyes Can follow some commands on left  Allergies: Penicillins; Aspirin; and Morphine  Medications: Prior to Admission medications   Medication Sig Start Date End Date Taking? Authorizing Provider  ACACIA GUM PO Place 1-2 tablets under the tongue every 4 (four) hours as needed (allergies). [facility] allergy relief   Yes Historical Provider, MD  Cranberry 140-100-3 MG-MG-UNIT CAPS Take 1 capsule by mouth daily. [facility]   Yes Historical Provider, MD  diltiazem (CARDIZEM CD) 180 MG 24 hr capsule Take 180 mg by mouth daily. 06/24/15  Yes Historical Provider, MD  GARLIC PO Take 0000000 mg by mouth 2 (two) times daily with a meal. "Kyolic"   Yes Historical Provider, MD  L-Arginine 500 MG CAPS Take 500 mg by mouth daily. [place] [employer]   Yes Historical Provider, MD  OVER THE COUNTER MEDICATION Take 480 mg by mouth 3 (three) times daily with meals. Stinging Nettles - [employer] [facility] [employer]   Yes Historical Provider, MD  OVER THE COUNTER MEDICATION Take 2 capsules by mouth 2 (two) times daily with a meal. Mushroom complex - [employer] [facility] [employer]   Yes Historical Provider, MD  OVER THE COUNTER MEDICATION Take 1 tablet by mouth 3 (three) times daily with meals. Ultra Sugar Aid - cliinically studied chromium - [employer] [facility] [employer]   Yes Historical Provider, MD  OVER THE COUNTER MEDICATION Take 3 tablets by mouth 2 (two) times daily. [place] for [employer]   Yes Historical Provider, MD  OVER THE COUNTER MEDICATION Take 2 capsules by mouth daily. Clinical Strength Blood Pressure Support - [place]   Yes Historical Provider, MD  OVER THE COUNTER MEDICATION Take 2 tablets by mouth 3 (three) times daily with meals. [place] - Advanced Ginkgo  Extract Eyes- [facility] [facility]   Yes Historical Provider, MD  OVER THE COUNTER MEDICATION Take 2 tablets by mouth daily. Heart Rescue - [place]   Yes Historical Provider, MD  OVER THE COUNTER MEDICATION Take 1 capsule by mouth daily. Turmeric 450 mg and Black Pepper 5 mg - Plnt   Yes Historical Provider, MD  OVER THE COUNTER MEDICATION Take 2 mLs by mouth daily. [place] for [employer]   Yes Historical Provider, MD  OVER THE COUNTER MEDICATION Take 1 mL by mouth daily. Supreme green tea - [employer]   Yes Historical Provider, MD  VANADYL SULFATE PO Take 2 mg by mouth daily. [employer] [facility] [employer]   Yes Historical Provider, MD  Vitamin Mixture (VITAMIN E-SELENIUM) 400-50 UNIT-MCG CAPS Take 1 capsule by mouth daily. [employer]   Yes Historical Provider, MD     Vital Signs: BP 163/69 mmHg  Pulse 65  Temp(Src) 97.5 F (36.4 C) (Axillary)  Resp 15  Ht 5\' 2"  (1.575 m)  Wt 154 lb 15.7 oz (70.3 kg)  BMI 28.34 kg/m2  SpO2 100%  Physical Exam  Pulmonary/Chest:  Vent  Musculoskeletal:  Can follow simple commands to moveLeft foot /leg and arm  Skin: Skin is warm.  B groin sites Clean and dry Soft  NT no bleeding Rt foot 2+ pulses  Nursing note and vitals reviewed.   Imaging: Ct Angio Head W/cm &/or Wo Cm  07/19/2015  CLINICAL  DATA:  82 year old female code stroke patient with right side weakness. Initial encounter. EXAM: CT ANGIOGRAPHY HEAD AND NECK CT CEREBRAL PERFUSION WITH CONTRAST TECHNIQUE: Multidetector CT imaging of the head and neck was performed using the standard protocol during bolus administration of intravenous contrast. Multiplanar CT image reconstructions and MIPs were obtained to evaluate the vascular anatomy. Carotid stenosis measurements (when applicable) are obtained utilizing NASCET criteria, using the distal internal carotid diameter as the denominator. Cerebral CT perfusion was also performed and images were post processed into  perfusion maps. CONTRAST:  63mL OMNIPAQUE IOHEXOL 350 MG/ML SOLN COMPARISON:  Noncontrast head CT 2133 hours today. FINDINGS: CT PERFUSION Left MCA territory abnormal mean-transit-time and time-to-peak with associated decreased cerebral blood flow. Cerebral blood volume is relatively preserved, suggesting compensated ischemia and penumbra. On CT profusion source images delayed collateral enhancement in the left MCA territory occurs. Furthermore, on the comparison noncontrast CT (reported separately) I estimate the ASPECTS score at 9 or 10, with little to no CT changes of left MCA ischemia. CTA NECK Skeleton: Osteopenia. No acute osseous abnormality identified. Other neck: Patchy ground-glass opacity with pulmonary septal thickening in the visualized upper lungs. No superior mediastinal lymphadenopathy. Negative thyroid, larynx, pharynx, parapharyngeal spaces, retropharyngeal space, sublingual space, submandibular glands and parotid glands. No cervical lymphadenopathy. Aortic arch: Slight bovine type arch configuration. Minimal arch atherosclerosis. No great vessel origin stenosis. Right carotid system: Tortuous right CCA in the lower neck. Minimal for age atherosclerosis at the right carotid bifurcation. Tortuous cervical right ICA, otherwise negative to the skullbase. Left carotid system: Minimal plaque at the left CCA origin. Tortuous proximal left CCA. Minimal plaque at the left carotid bifurcation which is widely patent. Tortuous cervical left ICA but otherwise negative to the skullbase. See intracranial findings below. Vertebral arteries:No proximal subclavian artery stenosis. Normal vertebral artery origins. Tortuous bilateral V1 segments. The right vertebral artery is mildly dominant. No vertebral artery stenosis to the skullbase. CTA HEAD Posterior circulation: Non dominant left vertebral artery is diminutive beyond the left PICA origin. Normal right PICA origin. Mild calcified and soft plaque in the distal  right V4 segment resulting in mild stenosis. Patent vertebrobasilar junction. Moderate proximal basilar artery irregularity and stenosis. Irregularity continues to the mid basilar artery. The distal basilar artery has a more normal appearance. Normal SCA and right PCA origins. Fetal type left PCA origin with enhancing left posterior communicating artery. The right posterior communicating artery is diminutive or absent. Bilateral PCA branches are irregular but patent. Anterior circulation: Patent right ICA siphon with calcified plaque. Mild right siphon stenosis. Patent right ICA terminus. Right MCA M1 segment is patent with mild to moderate irregularity. Right MCA bifurcation is patent with moderate irregularity which continues into the right MCA branches. Proximal left ICA siphon is patent but there is thrombus at the left ICA terminus extending from the level of the posterior communicating artery to the left MCA and ACA origins. Thrombus in both left M1 and A1 segments with reconstituted flow. Furthermore, the left A1 segment appears dominant. There is reconstituted flow at the anterior communicating artery and in the bilateral ACA branches which are irregular but patent. There is poor to intermediate degree of early reconstituted flow at the left MCA bifurcation. Attenuated left MCA M2 branches. Venous sinuses: Appear patent, but suboptimal venous contrast timing. Anatomic variants: Suspected dominant left ACA A1 segment. IMPRESSION: 1. Positive for emergent large vessel occlusion at the left ICA terminus. Thrombus from the level of the left posterior communicating artery extending into  both the left M1 and A1 segments. Some early reconstituted left MCA and ACA enhancement. 2. Large area of abnormal left MCA perfusion characteristics, but with relatively preserved cerebral blood volume and delayed collateral enhancement on the perfusion source images. 3. In conjunction with the noncontrast head CT (reported  separately) the above constellation is favorable for endovascular reperfusion. This was discussed by telephone with Dr. Wallie Char on 07/19/2015 at 2156 hours. 4. No superimposed arterial stenosis in the neck. Tortuous bilateral carotid arteries. 5. Superimposed intracranial atherosclerosis elsewhere including moderate proximal basilar stenosis, mild stenosis of the dominant distal right vertebral artery, and mild to moderate bilateral PCA and right MCA branch irregularity. 6. Bilateral upper lobe pulmonary ground-glass opacity and septal thickening may indicate developing interstitial edema. Electronically Signed   By: Genevie Ann M.D.   On: 07/19/2015 22:31   Ct Head Wo Contrast  07/20/2015  CLINICAL DATA:  Stroke, follow-up status post revascularization of LEFT internal carotid artery, LEFT middle cerebral and LEFT anterior cerebral arteries. EXAM: CT HEAD WITHOUT CONTRAST TECHNIQUE: Contiguous axial images were obtained from the base of the skull through the vertex without intravenous contrast. COMPARISON:  CT and CTA head July 19, 2015 FINDINGS: INTRACRANIAL CONTENTS: The ventricles and sulci are normal for age. No intraparenchymal hemorrhage, mass effect nor midline shift. Patchy supratentorial white matter hypodensities are less than expected for patient's age and though non-specific likely represent chronic small vessel ischemic disease. No acute large vascular territory infarcts. No abnormal extra-axial fluid collections. Basal cisterns are patent. Moderate calcific atherosclerosis of the carotid siphons. Intravascular residual contrast. ORBITS: The included ocular globes and orbital contents are non-suspicious. Status post bilateral ocular lens implants. SINUSES: The mastoid aircells and included paranasal sinuses are well-aerated. SKULL/SOFT TISSUES: No skull fracture. No significant soft tissue swelling. IMPRESSION: No acute intracranial process, status post cerebral angiogram with residual  intravascular contrast. Involutional changes and mild chronic small vessel ischemic disease. Electronically Signed   By: Elon Alas M.D.   On: 07/20/2015 01:31   Ct Head Wo Contrast  07/19/2015  CLINICAL DATA:  82 year old female found out site. Right-sided weakness. Code stroke. EXAM: CT HEAD WITHOUT CONTRAST TECHNIQUE: Contiguous axial images were obtained from the base of the skull through the vertex without intravenous contrast. COMPARISON:  None FINDINGS: There is mild prominence of the ventricles and sulci compatible with age-related atrophy. Moderate periventricular and deep white matter chronic microvascular ischemic changes noted. There is no acute intracranial hemorrhage. No mass effect or midline shift noted. Stop the visualized paranasal sinuses and mastoid air cells are clear. The calvarium is intact. IMPRESSION: No acute intracranial hemorrhage. Age-related atrophy and chronic microvascular ischemic disease. If symptoms persist and there are no contraindications, MRI may provide better evaluation if clinically indicated. Electronically Signed   By: Anner Crete M.D.   On: 07/19/2015 21:56   Ct Angio Neck W/cm &/or Wo/cm  07/19/2015  CLINICAL DATA:  82 year old female code stroke patient with right side weakness. Initial encounter. EXAM: CT ANGIOGRAPHY HEAD AND NECK CT CEREBRAL PERFUSION WITH CONTRAST TECHNIQUE: Multidetector CT imaging of the head and neck was performed using the standard protocol during bolus administration of intravenous contrast. Multiplanar CT image reconstructions and MIPs were obtained to evaluate the vascular anatomy. Carotid stenosis measurements (when applicable) are obtained utilizing NASCET criteria, using the distal internal carotid diameter as the denominator. Cerebral CT perfusion was also performed and images were post processed into perfusion maps. CONTRAST:  85mL OMNIPAQUE IOHEXOL 350 MG/ML SOLN COMPARISON:  Noncontrast head CT 2133 hours today.  FINDINGS: CT PERFUSION Left MCA territory abnormal mean-transit-time and time-to-peak with associated decreased cerebral blood flow. Cerebral blood volume is relatively preserved, suggesting compensated ischemia and penumbra. On CT profusion source images delayed collateral enhancement in the left MCA territory occurs. Furthermore, on the comparison noncontrast CT (reported separately) I estimate the ASPECTS score at 9 or 10, with little to no CT changes of left MCA ischemia. CTA NECK Skeleton: Osteopenia. No acute osseous abnormality identified. Other neck: Patchy ground-glass opacity with pulmonary septal thickening in the visualized upper lungs. No superior mediastinal lymphadenopathy. Negative thyroid, larynx, pharynx, parapharyngeal spaces, retropharyngeal space, sublingual space, submandibular glands and parotid glands. No cervical lymphadenopathy. Aortic arch: Slight bovine type arch configuration. Minimal arch atherosclerosis. No great vessel origin stenosis. Right carotid system: Tortuous right CCA in the lower neck. Minimal for age atherosclerosis at the right carotid bifurcation. Tortuous cervical right ICA, otherwise negative to the skullbase. Left carotid system: Minimal plaque at the left CCA origin. Tortuous proximal left CCA. Minimal plaque at the left carotid bifurcation which is widely patent. Tortuous cervical left ICA but otherwise negative to the skullbase. See intracranial findings below. Vertebral arteries:No proximal subclavian artery stenosis. Normal vertebral artery origins. Tortuous bilateral V1 segments. The right vertebral artery is mildly dominant. No vertebral artery stenosis to the skullbase. CTA HEAD Posterior circulation: Non dominant left vertebral artery is diminutive beyond the left PICA origin. Normal right PICA origin. Mild calcified and soft plaque in the distal right V4 segment resulting in mild stenosis. Patent vertebrobasilar junction. Moderate proximal basilar artery  irregularity and stenosis. Irregularity continues to the mid basilar artery. The distal basilar artery has a more normal appearance. Normal SCA and right PCA origins. Fetal type left PCA origin with enhancing left posterior communicating artery. The right posterior communicating artery is diminutive or absent. Bilateral PCA branches are irregular but patent. Anterior circulation: Patent right ICA siphon with calcified plaque. Mild right siphon stenosis. Patent right ICA terminus. Right MCA M1 segment is patent with mild to moderate irregularity. Right MCA bifurcation is patent with moderate irregularity which continues into the right MCA branches. Proximal left ICA siphon is patent but there is thrombus at the left ICA terminus extending from the level of the posterior communicating artery to the left MCA and ACA origins. Thrombus in both left M1 and A1 segments with reconstituted flow. Furthermore, the left A1 segment appears dominant. There is reconstituted flow at the anterior communicating artery and in the bilateral ACA branches which are irregular but patent. There is poor to intermediate degree of early reconstituted flow at the left MCA bifurcation. Attenuated left MCA M2 branches. Venous sinuses: Appear patent, but suboptimal venous contrast timing. Anatomic variants: Suspected dominant left ACA A1 segment. IMPRESSION: 1. Positive for emergent large vessel occlusion at the left ICA terminus. Thrombus from the level of the left posterior communicating artery extending into both the left M1 and A1 segments. Some early reconstituted left MCA and ACA enhancement. 2. Large area of abnormal left MCA perfusion characteristics, but with relatively preserved cerebral blood volume and delayed collateral enhancement on the perfusion source images. 3. In conjunction with the noncontrast head CT (reported separately) the above constellation is favorable for endovascular reperfusion. This was discussed by telephone with  Dr. Wallie Char on 07/19/2015 at 2156 hours. 4. No superimposed arterial stenosis in the neck. Tortuous bilateral carotid arteries. 5. Superimposed intracranial atherosclerosis elsewhere including moderate proximal basilar stenosis, mild stenosis of the dominant distal  right vertebral artery, and mild to moderate bilateral PCA and right MCA branch irregularity. 6. Bilateral upper lobe pulmonary ground-glass opacity and septal thickening may indicate developing interstitial edema. Electronically Signed   By: Genevie Ann M.D.   On: 07/19/2015 22:31   Mr Brain Wo Contrast  07/20/2015  CLINICAL DATA:  Stroke, status post revascularization of LEFT internal carotid artery and LEFT anterior and middle cerebral arteries. History of atrial fibrillation, hypertension and cancer. EXAM: MRI HEAD WITHOUT CONTRAST TECHNIQUE: Multiplanar, multiecho pulse sequences of the brain and surrounding structures were obtained without intravenous contrast. COMPARISON:  CT head July 20, 2015 FINDINGS: Confluent reduced diffusion LEFT frontal lobe, predominately medial. Patchy reduced diffusion LEFT mesial temporal lobe LEFT basal ganglia, LEFT posterior frontal lobe with corresponding low ADC values and bright T2 FLAIR. 3 mm LEFT-to-RIGHT midline shift. No susceptibility artifact to suggest hemorrhage. Ventricles and sulci are overall normal for patient's age. Patchy FLAIR T2 hyperintense signal exclusive of the aforementioned abnormality. Old Small LEFT cerebellar infarct. No abnormal extra-axial fluid collections. Normal major intracranial vascular flow voids present at skull base. Status post bilateral ocular lens implants. No abnormal sellar expansion. No cerebellar tonsillar ectopia. No suspicious calvarial bone marrow signal. IMPRESSION: Acute large LEFT anterior cerebral artery territory infarct. Acute small LEFT middle cerebral artery territory infarct, predominately involving the basal ganglia and LEFT frontal lobe. 3 mm  LEFT-to-RIGHT midline shift. Moderate chronic small vessel ischemic disease and old small LEFT cerebellar infarct. These results will be called to the ordering clinician or representative by the Radiologist Assistant, and communication documented in the PACS or zVision Dashboard. Electronically Signed   By: Elon Alas M.D.   On: 07/20/2015 22:25   Ct Cerebral Perfusion W/cm  07/19/2015  CLINICAL DATA:  82 year old female code stroke patient with right side weakness. Initial encounter. EXAM: CT ANGIOGRAPHY HEAD AND NECK CT CEREBRAL PERFUSION WITH CONTRAST TECHNIQUE: Multidetector CT imaging of the head and neck was performed using the standard protocol during bolus administration of intravenous contrast. Multiplanar CT image reconstructions and MIPs were obtained to evaluate the vascular anatomy. Carotid stenosis measurements (when applicable) are obtained utilizing NASCET criteria, using the distal internal carotid diameter as the denominator. Cerebral CT perfusion was also performed and images were post processed into perfusion maps. CONTRAST:  33mL OMNIPAQUE IOHEXOL 350 MG/ML SOLN COMPARISON:  Noncontrast head CT 2133 hours today. FINDINGS: CT PERFUSION Left MCA territory abnormal mean-transit-time and time-to-peak with associated decreased cerebral blood flow. Cerebral blood volume is relatively preserved, suggesting compensated ischemia and penumbra. On CT profusion source images delayed collateral enhancement in the left MCA territory occurs. Furthermore, on the comparison noncontrast CT (reported separately) I estimate the ASPECTS score at 9 or 10, with little to no CT changes of left MCA ischemia. CTA NECK Skeleton: Osteopenia. No acute osseous abnormality identified. Other neck: Patchy ground-glass opacity with pulmonary septal thickening in the visualized upper lungs. No superior mediastinal lymphadenopathy. Negative thyroid, larynx, pharynx, parapharyngeal spaces, retropharyngeal space,  sublingual space, submandibular glands and parotid glands. No cervical lymphadenopathy. Aortic arch: Slight bovine type arch configuration. Minimal arch atherosclerosis. No great vessel origin stenosis. Right carotid system: Tortuous right CCA in the lower neck. Minimal for age atherosclerosis at the right carotid bifurcation. Tortuous cervical right ICA, otherwise negative to the skullbase. Left carotid system: Minimal plaque at the left CCA origin. Tortuous proximal left CCA. Minimal plaque at the left carotid bifurcation which is widely patent. Tortuous cervical left ICA but otherwise negative to the skullbase. See  intracranial findings below. Vertebral arteries:No proximal subclavian artery stenosis. Normal vertebral artery origins. Tortuous bilateral V1 segments. The right vertebral artery is mildly dominant. No vertebral artery stenosis to the skullbase. CTA HEAD Posterior circulation: Non dominant left vertebral artery is diminutive beyond the left PICA origin. Normal right PICA origin. Mild calcified and soft plaque in the distal right V4 segment resulting in mild stenosis. Patent vertebrobasilar junction. Moderate proximal basilar artery irregularity and stenosis. Irregularity continues to the mid basilar artery. The distal basilar artery has a more normal appearance. Normal SCA and right PCA origins. Fetal type left PCA origin with enhancing left posterior communicating artery. The right posterior communicating artery is diminutive or absent. Bilateral PCA branches are irregular but patent. Anterior circulation: Patent right ICA siphon with calcified plaque. Mild right siphon stenosis. Patent right ICA terminus. Right MCA M1 segment is patent with mild to moderate irregularity. Right MCA bifurcation is patent with moderate irregularity which continues into the right MCA branches. Proximal left ICA siphon is patent but there is thrombus at the left ICA terminus extending from the level of the posterior  communicating artery to the left MCA and ACA origins. Thrombus in both left M1 and A1 segments with reconstituted flow. Furthermore, the left A1 segment appears dominant. There is reconstituted flow at the anterior communicating artery and in the bilateral ACA branches which are irregular but patent. There is poor to intermediate degree of early reconstituted flow at the left MCA bifurcation. Attenuated left MCA M2 branches. Venous sinuses: Appear patent, but suboptimal venous contrast timing. Anatomic variants: Suspected dominant left ACA A1 segment. IMPRESSION: 1. Positive for emergent large vessel occlusion at the left ICA terminus. Thrombus from the level of the left posterior communicating artery extending into both the left M1 and A1 segments. Some early reconstituted left MCA and ACA enhancement. 2. Large area of abnormal left MCA perfusion characteristics, but with relatively preserved cerebral blood volume and delayed collateral enhancement on the perfusion source images. 3. In conjunction with the noncontrast head CT (reported separately) the above constellation is favorable for endovascular reperfusion. This was discussed by telephone with Dr. Wallie Char on 07/19/2015 at 2156 hours. 4. No superimposed arterial stenosis in the neck. Tortuous bilateral carotid arteries. 5. Superimposed intracranial atherosclerosis elsewhere including moderate proximal basilar stenosis, mild stenosis of the dominant distal right vertebral artery, and mild to moderate bilateral PCA and right MCA branch irregularity. 6. Bilateral upper lobe pulmonary ground-glass opacity and septal thickening may indicate developing interstitial edema. Electronically Signed   By: Genevie Ann M.D.   On: 07/19/2015 22:31   Dg Chest Port 1 View  07/20/2015  CLINICAL DATA:  82 year old female status post intubation. EXAM: PORTABLE CHEST 1 VIEW COMPARISON:  Chest radiograph dated 09/11/2009 FINDINGS: An endotracheal tube is noted with tip  approximately 5 cm above the carina. Patchy areas of hazy density at the left lung base and perihilar region are concerning for pneumonia. There is diffuse interstitial prominence, left greater right which may be related to underlying congestive changes or edema. Trace fluid noted in the right minor fissure. No significant pleural effusion. No pneumothorax. Mild cardiomegaly. No acute osseous pathology. IMPRESSION: Left lung base and right hilar hazy densities concerning for pneumonia superimposed on background of congestion/edema. Clinical correlation and follow-up recommended. Endotracheal tube above the carina. Electronically Signed   By: Anner Crete M.D.   On: 07/20/2015 06:46    Labs:  CBC:  Recent Labs  07/19/15 2121 07/19/15 2126 07/20/15 0450 07/21/15  0332  WBC 5.9  --  6.8 9.3  HGB 14.4 16.0* 12.6 13.1  HCT 44.0 47.0* 39.3 40.4  PLT 138*  --  117* 122*    COAGS:  Recent Labs  07/19/15 2121  INR 1.20  APTT 31    BMP:  Recent Labs  07/19/15 2121 07/19/15 2126 07/20/15 0450 07/21/15 0332  NA 141 143 141 144  K 3.8 3.7 3.2* 4.0  CL 108 106 108 113*  CO2 23  --  22 20*  GLUCOSE 140* 134* 197* 121*  BUN 21* 26* 20 21*  CALCIUM 9.3  --  8.4* 8.7*  CREATININE 0.83 0.80 0.76 0.74  GFRNONAA >60  --  >60 >60  GFRAA >60  --  >60 >60    LIVER FUNCTION TESTS:  Recent Labs  07/19/15 2121 07/21/15 0332  BILITOT 0.7  --   AST 25  --   ALT 26  --   ALKPHOS 107  --   PROT 7.2  --   ALBUMIN 4.2 3.2*    Assessment and Plan:  CVA L ICA/MCA/ACA clot retrieval 3/26 Will follow  Electronically Signed: Omair Dettmer A 07/21/2015, 2:00 PM   I spent a total of 15 Minutes at the the patient's bedside AND on the patient's hospital floor or unit, greater than 50% of which was counseling/coordinating care for CVA; clot retrieval

## 2015-07-21 NOTE — Progress Notes (Signed)
PT Cancellation Note  Patient Details Name: MARLAYAH KAUFMANN MRN: [id number] DOB: [birth year]   Cancelled Treatment:    Reason Eval/Treat Not Completed: Patient not medically ready.  Pt currently with multiple active bedrest orders.  Please advance activity orders once appropriate for PT and mobility.     Catarina Hartshorn, [place] 07/21/2015, 8:19 AM

## 2015-07-22 ENCOUNTER — Inpatient Hospital Stay (HOSPITAL_COMMUNITY): Payer: PPO

## 2015-07-22 DIAGNOSIS — I481 Persistent atrial fibrillation: Secondary | ICD-10-CM

## 2015-07-22 LAB — GLUCOSE, CAPILLARY
GLUCOSE-CAPILLARY: 124 mg/dL — AB (ref 65–99)
GLUCOSE-CAPILLARY: 129 mg/dL — AB (ref 65–99)
GLUCOSE-CAPILLARY: 129 mg/dL — AB (ref 65–99)
GLUCOSE-CAPILLARY: 145 mg/dL — AB (ref 65–99)
GLUCOSE-CAPILLARY: 154 mg/dL — AB (ref 65–99)
Glucose-Capillary: 122 mg/dL — ABNORMAL HIGH (ref 65–99)

## 2015-07-22 LAB — BASIC METABOLIC PANEL
Anion gap: 7 (ref 5–15)
BUN: 18 mg/dL (ref 6–20)
CALCIUM: 8.4 mg/dL — AB (ref 8.9–10.3)
CO2: 23 mmol/L (ref 22–32)
CREATININE: 0.68 mg/dL (ref 0.44–1.00)
Chloride: 114 mmol/L — ABNORMAL HIGH (ref 101–111)
GFR calc Af Amer: >60 mL/min (ref >60)
GLUCOSE: 130 mg/dL — AB (ref 65–99)
Potassium: 3.2 mmol/L — ABNORMAL LOW (ref 3.5–5.1)
SODIUM: 144 mmol/L (ref 135–145)

## 2015-07-22 LAB — CBC
HEMATOCRIT: 40.7 % (ref 36.0–46.0)
Hemoglobin: 12.9 g/dL (ref 12.0–15.0)
MCH: 30.1 pg (ref 26.0–34.0)
MCHC: 31.7 g/dL (ref 30.0–36.0)
MCV: 94.9 fL (ref 78.0–100.0)
PLATELETS: 101 10*3/uL — AB (ref 150–400)
RBC: 4.29 MIL/uL (ref 3.87–5.11)
RDW: 13.3 % (ref 11.5–15.5)
WBC: 8.2 10*3/uL (ref 4.0–10.5)

## 2015-07-22 MED ORDER — IPRATROPIUM-ALBUTEROL 0.5-2.5 (3) MG/3ML IN SOLN
3.0000 mL | Freq: Four times a day (QID) | RESPIRATORY_TRACT | Status: DC | PRN
  Administered 2015-07-22: 3 mL via RESPIRATORY_TRACT
  Filled 2015-07-22: qty 3

## 2015-07-22 MED ORDER — POTASSIUM CHLORIDE 20 MEQ/15ML (10%) PO SOLN
30.0000 meq | ORAL | Status: AC
  Administered 2015-07-22 (×2): 30 meq
  Filled 2015-07-22 (×2): qty 30

## 2015-07-22 MED ORDER — METHYLPREDNISOLONE SODIUM SUCC 125 MG IJ SOLR
80.0000 mg | Freq: Once | INTRAMUSCULAR | Status: AC
  Administered 2015-07-22: 80 mg via INTRAVENOUS
  Filled 2015-07-22: qty 2

## 2015-07-22 MED ORDER — RACEPINEPHRINE HCL 2.25 % IN NEBU: 0.5000 mL | INHALATION_SOLUTION | Freq: Once | RESPIRATORY_TRACT | Status: AC

## 2015-07-22 MED ORDER — RACEPINEPHRINE HCL 2.25 % IN NEBU
INHALATION_SOLUTION | RESPIRATORY_TRACT | Status: AC
Start: 2015-07-22 — End: 2015-07-22
  Administered 2015-07-22: 0.5 mL
  Filled 2015-07-22: qty 0.5

## 2015-07-22 NOTE — Progress Notes (Signed)
PULMONARY / CRITICAL CARE MEDICINE   Name: Joy Holden MRN: [id number] DOB: [birth year]    ADMISSION DATE:  07/19/2015 CONSULTATION DATE:  07/20/2015  REFERRING MD :  Dr. Nicole Kindred  CHIEF COMPLAINT:  Dysarthria  HISTORY OF PRESENT ILLNESS:    82 y.o. female history of atrial fibrillation and hypertension brought to the emergency room following acute onset of speech abnormality, right facial droop and right hemiplegia. Patient was driving at the time of onset and was  Last normal about 8:15 PM on 07/19/2015.  She reportedly is allergic to aspirin.   In the ED CT head showed no acute intracranial abnormality. CT angiogram however showed occlusion of left ICA terminus with thrombus extending from the level of posterior indicating artery both M1 and A1 segments. She recived  TPA with no change in deficits. Her NIH stroke score was 26.   She was taken to interventional radiology and underwent revascularization of total occlusion of LT ICA, MCA, ACA for further management. She had a Solitaire 4 mm x 40 mm retrieval device and 5 mg of superselective IA TPA Blood pressure was elevated, requiring intervention with hydralazine IV.  SUBJECTIVE:  Intubated No obvious pain afebrile Poor UO  REVIEW OF SYSTEMS:  Unable to obtain given intubation and sedation.  VITAL SIGNS: Temp:  [97.5 F (36.4 C)-99.5 F (37.5 C)] 97.7 F (36.5 C) (03/28 0800) Pulse Rate:  [53-154] 78 (03/28 0902) Resp:  [0-35] 15 (03/28 0902) BP: (135-196)/(64-108) 181/87 mmHg (03/28 0902) SpO2:  [98 %-100 %] 100 % (03/28 0902) FiO2 (%):  [30 %] 30 % (03/28 0845) Weight:  [148 lb 5.9 oz (67.3 kg)] 148 lb 5.9 oz (67.3 kg) (03/28 0426) HEMODYNAMICS:   VENTILATOR SETTINGS: Vent Mode:  [-] PSV;CPAP FiO2 (%):  [30 %] 30 % Set Rate:  [15 bmp] 15 bmp Vt Set:  [420 mL] 420 mL PEEP:  [5 cmH20] 5 cmH20 Pressure Support:  [8 cmH20] 8 cmH20 Plateau Pressure:  [16 cmH20-18 cmH20] 16 cmH20 INTAKE / OUTPUT:  Intake/Output Summary  (Last 24 hours) at 07/22/15 0942 Last data filed at 07/22/15 0900  Gross per 24 hour  Intake   2082 ml  Output    635 ml  Net   1447 ml    PHYSICAL EXAMINATION: General:Acutely ill,. No family at bedside.  Integument:  Warm & dry. No rash on exposed skin.  HEENT:  Endotracheal tube in place. No scleral icterus or injection. Cardiovascular:  Regular rate. No edema. No appreciable JVD.  Pulmonary:  Clear bilaterally to auscultation bilaterally. Symmetric chest wall rise on ventilator. Abdomen: Soft. Normal bowel sounds. Nondistended.  Neurological:  Spontaneously moving left side, decreased on right. Pupils equal.   LABS:  CBC  Recent Labs Lab 07/20/15 0450 07/21/15 0332 07/22/15 0305  WBC 6.8 9.3 8.2  HGB 12.6 13.1 12.9  HCT 39.3 40.4 40.7  PLT 117* 122* 101*   Coag's  Recent Labs Lab 07/19/15 2121  APTT 31  INR 1.20   BMET  Recent Labs Lab 07/20/15 0450 07/21/15 0332 07/22/15 0305  NA 141 144 144  K 3.2* 4.0 3.2*  CL 108 113* 114*  CO2 22 20* 23  BUN 20 21* 18  CREATININE 0.76 0.74 0.68  GLUCOSE 197* 121* 130*   Electrolytes  Recent Labs Lab 07/20/15 0450 07/21/15 0332 07/22/15 0305  CALCIUM 8.4* 8.7* 8.4*  MG  --  2.0  --   PHOS  --  3.3  --    Sepsis Markers No results  for input(s): LATICACIDVEN, PROCALCITON, O2SATVEN in the last 168 hours. ABG  Recent Labs Lab 07/20/15 0400  PHART 7.456*  PCO2ART 32.4*  PO2ART 65.5*   Liver Enzymes  Recent Labs Lab 07/19/15 2121 07/21/15 0332  AST 25  --   ALT 26  --   ALKPHOS 107  --   BILITOT 0.7  --   ALBUMIN 4.2 3.2*   Cardiac Enzymes No results for input(s): TROPONINI, PROBNP in the last 168 hours. Glucose  Recent Labs Lab 07/21/15 1239 07/21/15 1528 07/21/15 2027 07/21/15 2302 07/22/15 0303 07/22/15 0724  GLUCAP 111* 85 112* 92 124* 129*    Imaging Dg Abd Portable 1v  07/21/2015  CLINICAL DATA:  82 year old female with enteric tube adjustment EXAM: PORTABLE ABDOMEN - 1  VIEW COMPARISON:  Radiograph dated 07/21/2015 FINDINGS: There has been interval advancement of the enteric tube with tip now in the inferior left hemi abdomen adjacent to the left L4 pedicle and likely within the stomach. There is no bowel dilatation or free air. No radiopaque calculi identified. There is degenerative changes of the spine. No acute fracture. IMPRESSION: Interval advancement of the enteric tube the tip likely within the stomach. Electronically Signed   By: Anner Crete M.D.   On: 07/21/2015 21:29   Dg Abd Portable 1v  07/21/2015  CLINICAL DATA:  Orogastric tube placement EXAM: PORTABLE ABDOMEN - 1 VIEW COMPARISON:  Portable exam 1734 hours without priors for comparison FINDINGS: Tip of orogastric tube projects over stomach but the proximal side-port projects over distal esophagus ; recommend advancing tube 5 cm to place proximal side-port within stomach. Atelectasis versus consolidation LEFT lower lobe. Visualized bowel gas pattern normal. Bones demineralized. IMPRESSION: Proximal side-port of orogastric tube projects over stomach ; recommend advancing tube 5 cm. Findings called to [place] on [facility] on 07/21/2015 at 1858 hours. Electronically Signed   By: Lavonia Dana M.D.   On: 07/21/2015 18:59   EVENTS: 3/25 - Admit  3/26 - Revascularization in IR   STUDIES: CT HEAD W/O 3/25: No intracranial hemorrhage. Age-related atrophy & critical microvascular ischemic disease. CTA HEAD/NECK 3/25: Emergent large vessel occlusion at the L ICA terminus. Thrombus from left PICA extending into both M1 & A1 segments. Large area of L MCA perfusion abnormal. No arterial stenosis in neck. Bilateral upper lobe ground glass in the lung & septal thickening. MRI 3/26 >>Acute large LEFT anterior cerebral artery territory infarct. Acute small LEFT middle cerebral artery territory infarct, 3 mm LEFT-to-RIGHT midline shift.   MICROBIOLOGY: MRSA PCR 3/26: Negative  ANTIBIOTICS: Vancomycin 3/25  (surgical prophylaxis)  LINES/TUBES: OETT 7.5 3/25>>>3/28Foley 3/25>>> PIV x2  ASSESSMENT / PLAN:  NEUROLOGIC A:   S/p revascularization of Lt ICA, MCA, ACA S/p CVA Residual anesthesia effects likely causing absent neuro exam   P:   Neurology & IR following RASS goal: 0 Q2 Neuro check Fentanyl IV prn  PULMONARY A: Post op resp failure - ETT adjsuted H/O Asthma  P:   Extubate   CARDIOVASCULAR A:  Atrial Fibrillation - No RVR. Bradycardia Total occlusion of LT ICA, MCA, ACA H/O HTN  P:  Off  Cardene gtt   RENAL A:   Hypokalemia - Mild.  P:   IVfs post contrast Monitor UOP with foley Trending renal function & replete electrolytes daily as needed  GASTROINTESTINAL A:  No acute issues.  P:   NPO Protonix IV daily Swallow eval in 24h  HEMATOLOGIC A:   Thrombocytopenia - Mild.  P:  Trending cell counts  daily w/ CBC SCDs  INFECTIOUS A:    No acute issues.  P: Monitor for signs of infection. Post surgical abx prophylaxis  ENDOCRINE A:   Hyperglycemia  - No h/o DM.  P:   Hgb A1c pending Accu-Checks q4hr SSI per algorithm  FAMILY DISCUSSION:  D/w granddaughter- POA, Gerlene was fully functional - would not want permanent tubes, DNR issued  TODAY'S SUMMARY:  82 yo female s/p CVA 2/2 total occlusion of Left ICA, MCA, ACA s/p revascularization by IR & TPA - extubate with do not reintubate orders  The patient is critically ill with multiple organ systems failure and requires high complexity decision making for assessment and support, frequent evaluation and titration of therapies, application of advanced monitoring technologies and extensive interpretation of multiple databases. Critical Care Time devoted to patient care services described in this note independent of APP time is 32 minutes.   Kara Mead MD. [facility]. [facility] Pager [phone] If no response call [phone]   07/22/2015      07/22/2015, 9:42 AM

## 2015-07-22 NOTE — Evaluation (Signed)
Occupational Therapy Evaluation Patient Details Name: Joy Holden MRN: [id number] DOB: January 04, 1925 Today's Date: 07/22/2015    History of Present Illness This 82 y.o. female admitted with abnormal speech, Rt facial droop, and Rt hemiplegia.  MRI showed Lt MCA and ACA territory infarcts.  She received tPA and mechanical thrombectomy with revasculization.  PMH includes:  A-Fib, HTN, Asthma    Clinical Impression   Pt admitted with above. She demonstrates the below listed deficits and will benefit from continued OT to maximize safety and independence with BADLs.  Pt presents to OT with Dense Rt hemiplegia, Lt gaze preference with Rt neglect, follows no commands.  She requires total A for ADLs and all mobility.   No family present during eval.   At this point, anticipate she will likely require SNF placement.  Will follow.       Follow Up Recommendations  SNF;Supervision/Assistance - 24 hour    Equipment Recommendations  None recommended by OT    Recommendations for Other Services       Precautions / Restrictions Precautions Precautions: Fall      Mobility Bed Mobility Overal bed mobility: Needs Assistance Bed Mobility: Rolling;Supine to Sit;Sit to Supine Rolling: Total assist   Supine to sit: Total assist Sit to supine: Total assist   General bed mobility comments: Pt does not attempt to assist with mobility   Transfers Overall transfer level: Needs assistance               General transfer comment: unable to safely attempt     Balance Overall balance assessment: Needs assistance Sitting-balance support: Feet supported Sitting balance-Leahy Scale: Poor Sitting balance - Comments: Pt pushes to Lt.  She requires mod to max A to maintain static sitting balance x  5 mins  Postural control: Right lateral lean     Standing balance comment: unable to attempt                             ADL Overall ADL's : Needs assistance/impaired Eating/Feeding:  NPO   Grooming: Wash/dry face;Total assistance;Bed level;Sitting   Upper Body Bathing: Total assistance;Bed level   Lower Body Bathing: Total assistance;Bed level   Upper Body Dressing : Total assistance;Bed level   Lower Body Dressing: Total assistance;Bed level   Toilet Transfer: Total assistance Toilet Transfer Details (indicate cue type and reason): unable to attempt  Toileting- Clothing Manipulation and Hygiene: Total assistance;Bed level       Functional mobility during ADLs: Total assistance General ADL Comments: Placed washcloth in her Lt hand and placed it on her face with instruction to wash her face.  She repeatedly placed the cloth on her forehead, but made no attempt to wipe or wash her face      Vision Additional Comments: Pt with Lt gaze preference.  Does not track therapist or objects, but does look to OT when her name is called.  Pt unable to participate in formal visual assessment    Perception Perception Perception Tested?: Yes Perception Deficits: Inattention/neglect Inattention/Neglect: Does not attend to right visual field;Does not attend to right side of body Spatial deficits: Pt with Lt gaze preference.  She will spontaneously turn head to midline    Praxis Praxis Praxis tested?: Deficits Deficits: Initiation    Pertinent Vitals/Pain Pain Assessment: Faces Faces Pain Scale: No hurt     Hand Dominance  (unsure )   Extremity/Trunk Assessment Upper Extremity Assessment Upper Extremity Assessment: RUE deficits/detail  RUE Deficits / Details: No active movement noted.  PROM WFL  RUE Coordination: decreased fine motor;decreased gross motor   Lower Extremity Assessment Lower Extremity Assessment: Defer to PT evaluation   Cervical / Trunk Assessment Cervical / Trunk Assessment: Kyphotic;Other exceptions Cervical / Trunk Exceptions: Pt with trunk weakness    Communication Communication Communication: Expressive difficulties;Receptive  difficulties   Cognition   Behavior During Therapy: Flat affect Overall Cognitive Status: Impaired/Different from baseline Area of Impairment: Attention;Following commands   Current Attention Level: Focused   Following Commands:  (follows no commands )       General Comments: Pt looks to OT, follows no commands.  She will assist with repetitive AAROM of UEs.   Unsure if communucation deficits could be impacting performance    General Comments       Exercises       Shoulder Instructions      Home Living Family/patient expects to be discharged to:: Unsure                                 Additional Comments: No family present during eval       Prior Functioning/Environment          Comments: No family present and pt unable to provide info.  Per chart, pt was independent and was driving PTA     OT Diagnosis: Generalized weakness;Cognitive deficits;Disturbance of vision;Hemiplegia dominant side;Apraxia   OT Problem List: Decreased strength;Decreased range of motion;Decreased activity tolerance;Impaired balance (sitting and/or standing);Impaired vision/perception;Decreased coordination;Decreased cognition;Decreased safety awareness;Decreased knowledge of use of DME or AE;Decreased knowledge of precautions;Cardiopulmonary status limiting activity;Impaired tone;Impaired UE functional use;Impaired sensation   OT Treatment/Interventions: Self-care/ADL training;Neuromuscular education;DME and/or AE instruction;Manual therapy;Splinting;Therapeutic activities;Cognitive remediation/compensation;Visual/perceptual remediation/compensation;Patient/family education;Balance training    OT Goals(Current goals can be found in the care plan section) Acute Rehab OT Goals OT Goal Formulation: Patient unable to participate in goal setting Time For Goal Achievement: 08/05/15 Potential to Achieve Goals: Fair ADL Goals Pt Will Perform Grooming: with mod assist;sitting Pt Will  Transfer to Toilet: with mod assist;squat pivot transfer;bedside commode Pt/caregiver will Perform Home Exercise Program:  (Family will be independent with PROM Lt UE) Additional ADL Goal #1: Pt will follow one step commands during ADL tasks with min cues/gestures  Additional ADL Goal #2: Pt will sit EOB x 5 mins with min A   OT Frequency: Min 2X/week   Barriers to D/C: Decreased caregiver support          Co-evaluation              End of Session Equipment Utilized During Treatment: Oxygen Nurse Communication: Mobility status  Activity Tolerance: Patient tolerated treatment well Patient left: in bed;with call bell/phone within reach   Time: 1310-1333 OT Time Calculation (min): 23 min Charges:  OT General Charges $OT Visit: 1 Procedure OT Evaluation $OT Eval Moderate Complexity: 1 Procedure OT Treatments $Therapeutic Activity: 8-22 mins G-Codes:    Tara Wich M 08/21/2015, 1:58 PM

## 2015-07-22 NOTE — Progress Notes (Signed)
[facility] ADULT ICU REPLACEMENT PROTOCOL FOR AM LAB REPLACEMENT ONLY  The patient does apply for the [facility] Adult ICU Electrolyte Replacment Protocol based on the criteria listed below:   1. Is GFR >/= 40 ml/min? Yes.    Patient's GFR today is >60 2. Is urine output >/= 0.5 ml/kg/hr for the last 6 hours? Yes.   Patient's UOP is 0.5 ml/kg/hr 3. Is BUN < 60 mg/dL? Yes.    Patient's BUN today is 18 4. Abnormal electrolyte  K 3.2 5. Ordered repletion with: per protocol 6. If a panic level lab has been reported, has the CCM MD in charge been notified? Yes.  .   Physician:  Chari Manning [facility] 07/22/2015 5:23 AM

## 2015-07-22 NOTE — Progress Notes (Signed)
Asked to speak with family by RN re: plan of care.   Family asked questions about the patient hoarseness: explained this may be due to tracheitis/ laryngitis from the ETT, vs. ?aspiration.  they asked whether she received steroids and if she is going to receive more tomorrow. Explained that physician will evaluate tomorrow to see if further steroids are needed.   Family asked whether she would eat today. Explained that she will likely not eat today (I do not see that she has had a swallow eval- however pt appears lethargic). Will re-eval tomorrow to see if she is ready to eat.   Family asked whether she will be made comfortable. Explained that she appears comfortable at this time, if they notice signs of discomfort we can intervene.   Marda Stalker MD.  07/22/2015

## 2015-07-22 NOTE — Procedures (Signed)
Extubation Procedure Note  Patient Details:   Name: Joy Holden DOB: 1925-01-06 MRN: [id number]   Airway Documentation:     Evaluation  O2 sats: stable throughout Complications: No apparent complications Patient did tolerate procedure well. Bilateral Breath Sounds: Clear   Yes   Patient extubated to 2L nasal cannula per MD order.  Positive cuff leak noted.  No evidence of stridor.  Patient able to phonate post extubation.  Sats currently 100%.  Vitals are stable.  No apparent complications.   Philomena Doheny 07/22/2015, 9:52 AM

## 2015-07-22 NOTE — Progress Notes (Signed)
PT Cancellation Note  Patient Details Name: Joy Holden MRN: [id number] DOB: October 07, 1924   Cancelled Treatment:    Reason Eval/Treat Not Completed: Medical issues which prohibited therapy. Discussed with patient's RN Trixie Deis) and pt has developed stridor after exertion with OT. PT evaluation deferred for today. Will reassess 07/23/15   [name] 07/22/2015, 2:19 PM  Pager [phone]

## 2015-07-22 NOTE — Progress Notes (Signed)
STROKE TEAM PROGRESS NOTE   SUBJECTIVE (INTERVAL HISTORY) Her daughter, granddaughter, and niece are at the bedside.  Overall her condition is stable. She still intubated, able to open eyes on voice but not following commands. Not able to wean off vent. Family does not want PEG or Trach.     OBJECTIVE Temp:  [97.7 F (36.5 C)-99.5 F (37.5 C)] 98.6 F (37 C) (03/28 1200) Pulse Rate:  [44-154] 75 (03/28 1400) Cardiac Rhythm:  [-] Atrial fibrillation (03/28 0800) Resp:  [0-25] 19 (03/28 1400) BP: (135-196)/(64-106) 190/106 mmHg (03/28 1400) SpO2:  [99 %-100 %] 100 % (03/28 1400) FiO2 (%):  [30 %] 30 % (03/28 0845) Weight:  [148 lb 5.9 oz (67.3 kg)] 148 lb 5.9 oz (67.3 kg) (03/28 0426)  CBC:   Recent Labs Lab 07/20/15 0450 07/21/15 0332 07/22/15 0305  WBC 6.8 9.3 8.2  NEUTROABS 5.4 6.4  --   HGB 12.6 13.1 12.9  HCT 39.3 40.4 40.7  MCV 93.6 93.7 94.9  PLT 117* 122* 101*    Basic Metabolic Panel:   Recent Labs Lab 07/21/15 0332 07/22/15 0305  NA 144 144  K 4.0 3.2*  CL 113* 114*  CO2 20* 23  GLUCOSE 121* 130*  BUN 21* 18  CREATININE 0.74 0.68  CALCIUM 8.7* 8.4*  MG 2.0  --   PHOS 3.3  --     Lipid Panel:     Component Value Date/Time   CHOL 129 07/20/2015 0350   TRIG 33 07/20/2015 0350   HDL 48 07/20/2015 0350   CHOLHDL 2.7 07/20/2015 0350   VLDL 7 07/20/2015 0350   LDLCALC 74 07/20/2015 0350   HgbA1c:  Lab Results  Component Value Date   HGBA1C 7.1* 07/20/2015   Urine Drug Screen:     Component Value Date/Time   LABOPIA NONE DETECTED 07/19/2015 2233   COCAINSCRNUR NONE DETECTED 07/19/2015 2233   LABBENZ NONE DETECTED 07/19/2015 2233   AMPHETMU NONE DETECTED 07/19/2015 2233   THCU NONE DETECTED 07/19/2015 2233   LABBARB NONE DETECTED 07/19/2015 2233      IMAGING I have personally reviewed the radiological images below and agree with the radiology interpretations.  Ct Angio Head and Neck W/cm &/or Wo Cm 07/19/2015   1. Positive for emergent  large vessel occlusion at the left ICA terminus. Thrombus from the level of the left posterior communicating artery extending into both the left M1 and A1 segments. Some early reconstituted left MCA and ACA enhancement.  2. Large area of abnormal left MCA perfusion characteristics, but with relatively preserved cerebral blood volume and delayed collateral enhancement on the perfusion source images.  3. In conjunction with the noncontrast head CT (reported separately) the above constellation is favorable for endovascular reperfusion.  4. No superimposed arterial stenosis in the neck. Tortuous bilateral carotid arteries.  5. Superimposed intracranial atherosclerosis elsewhere including moderate proximal basilar stenosis, mild stenosis of the dominant distal right vertebral artery, and mild to moderate bilateral PCA and right MCA branch irregularity.  6. Bilateral upper lobe pulmonary ground-glass opacity and septal thickening may indicate developing interstitial edema.   Ct Cerebral Perfusion W/cm 07/19/2015   1. Positive for emergent large vessel occlusion at the left ICA terminus. Thrombus from the level of the left posterior communicating artery extending into both the left M1 and A1 segments. Some early reconstituted left MCA and ACA enhancement.  2. Large area of abnormal left MCA perfusion characteristics, but with relatively preserved cerebral blood volume and delayed collateral enhancement on  the perfusion source images.  3. In conjunction with the noncontrast head CT (reported separately) the above constellation is favorable for endovascular reperfusion.  4. No superimposed arterial stenosis in the neck. Tortuous bilateral carotid arteries.  5. Superimposed intracranial atherosclerosis elsewhere including moderate proximal basilar stenosis, mild stenosis of the dominant distal right vertebral artery, and mild to moderate bilateral PCA and right MCA branch irregularity.  6. Bilateral upper lobe  pulmonary ground-glass opacity and septal thickening may indicate developing interstitial edema.   Ct Head Wo Contrast 07/20/2015   No acute intracranial process, status post cerebral angiogram with residual intravascular contrast. Involutional changes and mild chronic small vessel ischemic disease.   Ct Head Wo Contrast 07/19/2015   No acute intracranial hemorrhage. Age-related atrophy and chronic microvascular ischemic disease. If symptoms persist and there are no contraindications, MRI may provide better evaluation if clinically indicated.    Dg Chest Port 1 View 07/20/2015   Left lung base and right hilar hazy densities concerning for pneumonia superimposed on background of congestion/edema. Clinical correlation and follow-up recommended. Endotracheal tube above the carina.   Mr Brain Wo Contrast 07/20/2015  IMPRESSION: Acute large LEFT anterior cerebral artery territory infarct. Acute small LEFT middle cerebral artery territory infarct, predominately involving the basal ganglia and LEFT frontal lobe. 3 mm LEFT-to-RIGHT midline shift. Moderate chronic small vessel ischemic disease and old small LEFT cerebellar infarct.   2D echo - - Left ventricle: The cavity size was normal. There was mild  concentric hypertrophy. Systolic function was vigorous. The  estimated ejection fraction was in the range of 65% to 70%. - Aortic valve: Valve area (VTI): 1.51 cm^2. Valve area (Vmax):  1.74 cm^2. Valve area (Vmean): 1.74 cm^2. - Mitral valve: There was mild regurgitation. - Left atrium: The atrium was mildly dilated. - Right atrium: The atrium was mildly dilated. - Atrial septum: No defect or patent foramen ovale was identified.   PHYSICAL EXAM  Temp:  [97.7 F (36.5 C)-99.5 F (37.5 C)] 98.6 F (37 C) (03/28 1200) Pulse Rate:  [44-154] 75 (03/28 1400) Resp:  [0-25] 19 (03/28 1400) BP: (135-196)/(64-106) 190/106 mmHg (03/28 1400) SpO2:  [99 %-100 %] 100 % (03/28 1400) FiO2 (%):  [30 %]  30 % (03/28 0845) Weight:  [148 lb 5.9 oz (67.3 kg)] 148 lb 5.9 oz (67.3 kg) (03/28 0426)  General - Well nourished, well developed, intubated not on sedation.  Ophthalmologic - Fundi not visualized due to incooperation.  Cardiovascular - irregularly irregular heart rate and rhythm.  Neuro - intubated not on sedation, able to open eyes on voice, but not following commands. PERRL, left eye gaze preference, not blinking to visual threat with forced eye opening. Dolls eye present, corneal and gag present, right hemiplegia, 0/5 RUE and RLE, left UE and LE spontaneous movement against gravity. DTR 1+, no babinski. Sensation, coordination and gait not tested.   ASSESSMENT/PLAN Ms. Joy Holden is a 82 y.o. female with history of afib not on AC, HTN, asthma presenting with speech abnormality, right facial droop, and right hemiplegia.  She did  receive IV TPA Saturday, 07/19/2015 at 2215.  Stroke:  Dominant left MCA and ACA territory infarct likely embolic secondary to afib not on AC, s/p tPA and mechanical thrombectomy with TICI2b revasculization.  Resultant  Right hemiplegia and left gaze preference  MRI left MCA and ACA territory infarcts  CTA head and neck - left terminal ICA thrombus, bilateral siphon stenosis, right M1 stenosis   2D Echo  EF 65-70%  LDL 74  HgbA1c 7.1  VTE prophylaxis - lovenox  No antithrombotic prior to admission, now on ASA PR 300mg . Due to moderate to large size of stroke, will initiate AC in 5-7 days post stroke if no procedure planned.  Patient counseled to be compliant with her antithrombotic medications  Ongoing aggressive stroke risk factor management  Therapy recommendations:  Pending  Disposition: Pending  Chronic afib  Not on AC PTA  Due to moderate to large size of stroke, will initiate AC in 5-7 days post stroke if no procedure planned.  Home cardiazem  Respiratory failure  Intubated  CCM on board  Extubate as  able  Hypertension  Blood pressure elevated at times.  Permissive hypertension (OK if < 180/105) but gradually normalize in 5-7 days  Consider PO meds once po access.  Hyperlipidemia  Home meds:  No lipid lowering medications PTA  LDL 74, goal < 70  Initiate statin once po access  Other Stroke Risk Factors  Advanced age  Atrial fibrillation without anticoagulation  Other Active Problems  Mild thrombocytopenia  Hypokalemia - supplement   Hospital day # 3  This patient is critically ill due to left MCA infarct, left ICA occlusion s/p tPA and mechanical thrombectomy, respiratory failure, and afib and at significant risk of neurological worsening, death form recurrent stroke, hemorrhagic transformation, heart failure and seizure. This patient's care requires constant monitoring of vital signs, hemodynamics, respiratory and cardiac monitoring, review of multiple databases, neurological assessment, discussion with family, other specialists and medical decision making of high complexity. I spent 40 minutes of neurocritical care time in the care of this patient.  Rosalin Hawking, MD PhD Stroke Neurology 07/22/2015 2:15 PM    To contact Stroke Continuity provider, please refer to [email or web address] After hours, contact General Neurology

## 2015-07-22 NOTE — Progress Notes (Signed)
[facility]) called [place]. He has the password. He was updated on patient condition.He left a phone number for his business partner Bea Graff [phone]) who can contact him international. However I explained to him that [name] is listed as POA in the chart and that she will be 1st contact. I suggested he get in contact with family to ask them to keep him updated.

## 2015-07-22 NOTE — Progress Notes (Signed)
Patient transported from room 3M01 to room 3M14, with assistance of RN, without any complications.

## 2015-07-23 ENCOUNTER — Inpatient Hospital Stay (HOSPITAL_COMMUNITY): Payer: PPO

## 2015-07-23 DIAGNOSIS — E876 Hypokalemia: Secondary | ICD-10-CM

## 2015-07-23 DIAGNOSIS — I480 Paroxysmal atrial fibrillation: Secondary | ICD-10-CM | POA: Diagnosis present

## 2015-07-23 DIAGNOSIS — E785 Hyperlipidemia, unspecified: Secondary | ICD-10-CM | POA: Diagnosis present

## 2015-07-23 DIAGNOSIS — J9601 Acute respiratory failure with hypoxia: Secondary | ICD-10-CM

## 2015-07-23 DIAGNOSIS — G8191 Hemiplegia, unspecified affecting right dominant side: Secondary | ICD-10-CM

## 2015-07-23 DIAGNOSIS — R1310 Dysphagia, unspecified: Secondary | ICD-10-CM | POA: Diagnosis present

## 2015-07-23 DIAGNOSIS — R4701 Aphasia: Secondary | ICD-10-CM | POA: Diagnosis present

## 2015-07-23 LAB — BASIC METABOLIC PANEL
Anion gap: 10 (ref 5–15)
BUN: 18 mg/dL (ref 6–20)
CHLORIDE: 116 mmol/L — AB (ref 101–111)
CO2: 24 mmol/L (ref 22–32)
Calcium: 9.2 mg/dL (ref 8.9–10.3)
Creatinine, Ser: 0.64 mg/dL (ref 0.44–1.00)
GFR calc Af Amer: >60 mL/min (ref >60)
GFR calc non Af Amer: >60 mL/min (ref >60)
GLUCOSE: 128 mg/dL — AB (ref 65–99)
POTASSIUM: 3.8 mmol/L (ref 3.5–5.1)
Sodium: 150 mmol/L — ABNORMAL HIGH (ref 135–145)

## 2015-07-23 LAB — GLUCOSE, CAPILLARY
GLUCOSE-CAPILLARY: 86 mg/dL (ref 65–99)
GLUCOSE-CAPILLARY: 112 mg/dL — AB (ref 65–99)
GLUCOSE-CAPILLARY: 96 mg/dL (ref 65–99)
Glucose-Capillary: 122 mg/dL — ABNORMAL HIGH (ref 65–99)
Glucose-Capillary: 143 mg/dL — ABNORMAL HIGH (ref 65–99)

## 2015-07-23 LAB — CBC
HCT: 41.8 % (ref 36.0–46.0)
HEMOGLOBIN: 13.7 g/dL (ref 12.0–15.0)
MCH: 31.5 pg (ref 26.0–34.0)
MCHC: 32.8 g/dL (ref 30.0–36.0)
MCV: 96.1 fL (ref 78.0–100.0)
Platelets: 101 10*3/uL — ABNORMAL LOW (ref 150–400)
RBC: 4.35 MIL/uL (ref 3.87–5.11)
RDW: 13.2 % (ref 11.5–15.5)
WBC: 7.7 10*3/uL (ref 4.0–10.5)

## 2015-07-23 MED ORDER — POTASSIUM CHLORIDE 10 MEQ/100ML IV SOLN
INTRAVENOUS | Status: AC
  Administered 2015-07-23: 10 meq via INTRAVENOUS
  Filled 2015-07-23: qty 100

## 2015-07-23 MED ORDER — HYDRALAZINE HCL 20 MG/ML IJ SOLN
INTRAMUSCULAR | Status: AC
Start: 2015-07-23 — End: 2015-07-23
  Filled 2015-07-23: qty 1

## 2015-07-23 MED ORDER — ALBUTEROL SULFATE (2.5 MG/3ML) 0.083% IN NEBU
2.5000 mg | INHALATION_SOLUTION | Freq: Once | RESPIRATORY_TRACT | Status: AC
  Administered 2015-07-23: 2.5 mg via RESPIRATORY_TRACT

## 2015-07-23 MED ORDER — ASPIRIN 325 MG PO TABS
325.0000 mg | ORAL_TABLET | Freq: Every day | ORAL | Status: DC
  Administered 2015-07-23 – 2015-07-24 (×2): 325 mg via ORAL
  Filled 2015-07-23 (×2): qty 1

## 2015-07-23 MED ORDER — CETYLPYRIDINIUM CHLORIDE 0.05 % MT LIQD
7.0000 mL | Freq: Two times a day (BID) | OROMUCOSAL | Status: DC
  Administered 2015-07-23 – 2015-07-25 (×5): 7 mL via OROMUCOSAL

## 2015-07-23 MED ORDER — LABETALOL HCL 5 MG/ML IV SOLN
5.0000 mg | INTRAVENOUS | Status: DC | PRN
  Administered 2015-07-23: 20 mg via INTRAVENOUS
  Administered 2015-07-23 – 2015-07-24 (×3): 10 mg via INTRAVENOUS
  Administered 2015-07-24 (×2): 20 mg via INTRAVENOUS
  Filled 2015-07-23 (×6): qty 4

## 2015-07-23 MED ORDER — ASPIRIN EC 325 MG PO TBEC: 325.0000 mg | DELAYED_RELEASE_TABLET | Freq: Every day | ORAL | Status: DC

## 2015-07-23 MED ORDER — DILTIAZEM HCL ER COATED BEADS 180 MG PO CP24
180.0000 mg | ORAL_CAPSULE | Freq: Every day | ORAL | Status: DC
  Administered 2015-07-24 – 2015-07-27 (×3): 180 mg via ORAL
  Filled 2015-07-23 (×3): qty 1

## 2015-07-23 MED ORDER — POTASSIUM CHLORIDE 10 MEQ/100ML IV SOLN
10.0000 meq | INTRAVENOUS | Status: DC
  Administered 2015-07-23 (×2): 10 meq via INTRAVENOUS
  Filled 2015-07-23: qty 100

## 2015-07-23 MED ORDER — HYDRALAZINE HCL 20 MG/ML IJ SOLN
5.0000 mg | Freq: Once | INTRAMUSCULAR | Status: AC
  Administered 2015-07-23: 5 mg via INTRAVENOUS

## 2015-07-23 MED ORDER — PANTOPRAZOLE SODIUM 40 MG PO TBEC
40.0000 mg | DELAYED_RELEASE_TABLET | Freq: Every day | ORAL | Status: DC
  Administered 2015-07-24 – 2015-07-27 (×3): 40 mg via ORAL
  Filled 2015-07-23 (×3): qty 1

## 2015-07-23 MED ORDER — LISINOPRIL 20 MG PO TABS
20.0000 mg | ORAL_TABLET | Freq: Every day | ORAL | Status: DC
  Administered 2015-07-24: 20 mg via ORAL
  Filled 2015-07-23: qty 1

## 2015-07-23 MED ORDER — ALBUTEROL SULFATE (2.5 MG/3ML) 0.083% IN NEBU
INHALATION_SOLUTION | RESPIRATORY_TRACT | Status: AC
  Filled 2015-07-23: qty 3

## 2015-07-23 MED ORDER — ATORVASTATIN CALCIUM 10 MG PO TABS
10.0000 mg | ORAL_TABLET | Freq: Every day | ORAL | Status: DC
  Administered 2015-07-24 – 2015-07-27 (×4): 10 mg via ORAL
  Filled 2015-07-23 (×4): qty 1

## 2015-07-23 NOTE — Progress Notes (Signed)
PULMONARY / CRITICAL CARE MEDICINE   Name: Joy Holden MRN: [id number] DOB: 1924-09-23    ADMISSION DATE:  07/19/2015 CONSULTATION DATE:  07/20/2015  REFERRING MD :  Dr. Nicole Kindred  CHIEF COMPLAINT:  Dysarthria  HISTORY OF PRESENT ILLNESS:    82 y.o. female history of atrial fibrillation and hypertension brought to the emergency room following acute onset of speech abnormality, right facial droop and right hemiplegia. Patient was driving at the time of onset and was  Last normal about 8:15 PM on 07/19/2015.  She reportedly is allergic to aspirin.   In the ED CT head showed no acute intracranial abnormality. CT angiogram however showed occlusion of left ICA terminus with thrombus extending from the level of posterior indicating artery both M1 and A1 segments. She recived  TPA with no change in deficits. Her NIH stroke score was 26.   She was taken to interventional radiology and underwent revascularization of total occlusion of LT ICA, MCA, ACA for further management. She had a Solitaire 4 mm x 40 mm retrieval device and 5 mg of superselective IA TPA Blood pressure was elevated, requiring intervention with hydralazine IV.  SUBJECTIVE:  Intubated No obvious pain afebrile Poor UO  REVIEW OF SYSTEMS:  Unable to obtain given intubation and sedation.  VITAL SIGNS: Temp:  [97.2 F (36.2 C)-98.8 F (37.1 C)] 97.2 F (36.2 C) (03/29 0800) Pulse Rate:  [64-113] 64 (03/29 1000) Resp:  [13-28] 15 (03/29 1000) BP: (125-190)/(64-106) 176/96 mmHg (03/29 1000) SpO2:  [99 %-100 %] 100 % (03/29 1000) Weight:  [66.9 kg (147 lb 7.8 oz)] 66.9 kg (147 lb 7.8 oz) (03/29 0500) HEMODYNAMICS:   VENTILATOR SETTINGS:   INTAKE / OUTPUT:  Intake/Output Summary (Last 24 hours) at 07/23/15 1129 Last data filed at 07/23/15 1000  Gross per 24 hour  Intake   1725 ml  Output    645 ml  Net   1080 ml   PHYSICAL EXAMINATION: General:Acutely ill,. No family at bedside.  Integument:  Warm & dry. No rash on  exposed skin.  HEENT:  [place]/AT, PERRL, EOM-I and MMM. Cardiovascular:  Regular rate. No edema. No appreciable JVD.  Pulmonary: Wheezing on exam. Abdomen: Soft. Normal bowel sounds. Nondistended.  Neurological:  Spontaneously moving left side, decreased on right. Pupils equal.   LABS:  CBC  Recent Labs Lab 07/21/15 0332 07/22/15 0305 07/23/15 0746  WBC 9.3 8.2 7.7  HGB 13.1 12.9 13.7  HCT 40.4 40.7 41.8  PLT 122* 101* 101*   Coag's  Recent Labs Lab 07/19/15 2121  APTT 31  INR 1.20   BMET  Recent Labs Lab 07/20/15 0450 07/21/15 0332 07/22/15 0305  NA 141 144 144  K 3.2* 4.0 3.2*  CL 108 113* 114*  CO2 22 20* 23  BUN 20 21* 18  CREATININE 0.76 0.74 0.68  GLUCOSE 197* 121* 130*   Electrolytes  Recent Labs Lab 07/20/15 0450 07/21/15 0332 07/22/15 0305  CALCIUM 8.4* 8.7* 8.4*  MG  --  2.0  --   PHOS  --  3.3  --    Sepsis Markers No results for input(s): LATICACIDVEN, PROCALCITON, O2SATVEN in the last 168 hours. ABG  Recent Labs Lab 07/20/15 0400  PHART 7.456*  PCO2ART 32.4*  PO2ART 65.5*   Liver Enzymes  Recent Labs Lab 07/19/15 2121 07/21/15 0332  AST 25  --   ALT 26  --   ALKPHOS 107  --   BILITOT 0.7  --   ALBUMIN 4.2 3.2*  Cardiac Enzymes No results for input(s): TROPONINI, PROBNP in the last 168 hours. Glucose  Recent Labs Lab 07/22/15 1112 07/22/15 1622 07/22/15 2004 07/22/15 2303 07/23/15 0403 07/23/15 0837  GLUCAP 129* 145* 122* 154* 122* 86    Imaging Dg Swallowing Func-speech Pathology  07/23/2015  Objective Swallowing Evaluation: Type of Study: MBS-Modified Barium Swallow Study Patient Details Name: Joy Holden MRN: [id number] Date of Birth: 29-Apr-1924 Today's Date: 07/23/2015 Time: SLP Start Time (ACUTE ONLY): 1040-SLP Stop Time (ACUTE ONLY): 1055 SLP Time Calculation (min) (ACUTE ONLY): 15 min Past Medical History: Past Medical History Diagnosis Date . Hypertension  . Cancer ([place])  . Asthma  Past Surgical History:  Past Surgical History Procedure Laterality Date . Appendectomy   . Abdominal hysterectomy     PARTIAL . Cervical spine surgery   . Myomectomy   . Breast surgery   . Ectopic pregnancy surgery   . Neck surgery     c-spine . Radiology with anesthesia N/A 07/19/2015   Procedure: RADIOLOGY WITH ANESTHESIA;  Surgeon: Luanne Bras, MD;  Location: [place];  Service: Radiology;  Laterality: N/A; HPI: Ms. DARRIN LOBER is a 82 y.o. female with history of afib not on AC, HTN, asthma presenting with speech abnormality, right facial droop, and right hemiplegia. Pt recieved IV tPA on 3/25. MRI shows left MCA and ACA territory infarcts. Intubated from 3/25 to 3/28.  No Data Recorded Assessment / Plan / Recommendation CHL IP CLINICAL IMPRESSIONS 07/23/2015 Therapy Diagnosis -- Clinical Impression Pt demonstrates potential for oral dysphagia as pt was unwilling to take more than tiny bites, which family states is her baseline behavior when eating. Suspect potential for right sided pocketing during meal given overt weakness. Oropharyngeal dysphagia characterized by delayed swallow initation with risk for apsiraiton before the swallow with larger boluses. This did not occur during the study. Pt was noted to cough after POs given, as she did in the room, but no penetration or aspiration was observed. Recommend a dys 2 (fine chop) diet with thin liquids via straw with full supervision and aspiration precautions.  Provided instruction to family during exam on feeding pt and appropriate diet choices. SLP will follow for tolerance.  Impact on safety and function Mild aspiration risk   CHL IP TREATMENT RECOMMENDATION 07/23/2015 Treatment Recommendations Therapy as outlined in treatment plan below   Prognosis 07/23/2015 Prognosis for Safe Diet Advancement Good Barriers to Reach Goals -- Barriers/Prognosis Comment -- CHL IP DIET RECOMMENDATION 07/23/2015 SLP Diet Recommendations Thin liquid;Dysphagia 2 (Fine chop) solids Liquid Administration  via Cup;Straw Medication Administration Crushed with puree Compensations Slow rate;Small sips/bites;Minimize environmental distractions Postural Changes Seated upright at 90 degrees   CHL IP OTHER RECOMMENDATIONS 07/23/2015 Recommended Consults -- Oral Care Recommendations Oral care BID Other Recommendations --   CHL IP FOLLOW UP RECOMMENDATIONS 07/23/2015 Follow up Recommendations Inpatient Rehab   CHL IP FREQUENCY AND DURATION 07/23/2015 Speech Therapy Frequency (ACUTE ONLY) min 2x/week Treatment Duration 2 weeks      CHL IP ORAL PHASE 07/23/2015 Oral Phase WFL Oral - Pudding Teaspoon -- Oral - Pudding Cup -- Oral - Honey Teaspoon -- Oral - Honey Cup -- Oral - Nectar Teaspoon -- Oral - Nectar Cup -- Oral - Nectar Straw -- Oral - Thin Teaspoon -- Oral - Thin Cup -- Oral - Thin Straw -- Oral - Puree -- Oral - Mech Soft -- Oral - Regular -- Oral - Multi-Consistency -- Oral - Pill -- Oral Phase - Comment --  CHL IP PHARYNGEAL PHASE  07/23/2015 Pharyngeal Phase Impaired Pharyngeal- Pudding Teaspoon -- Pharyngeal -- Pharyngeal- Pudding Cup -- Pharyngeal -- Pharyngeal- Honey Teaspoon -- Pharyngeal -- Pharyngeal- Honey Cup -- Pharyngeal -- Pharyngeal- Nectar Teaspoon -- Pharyngeal -- Pharyngeal- Nectar Cup Delayed swallow initiation-vallecula Pharyngeal -- Pharyngeal- Nectar Straw Delayed swallow initiation-vallecula Pharyngeal -- Pharyngeal- Thin Teaspoon -- Pharyngeal -- Pharyngeal- Thin Cup -- Pharyngeal -- Pharyngeal- Thin Straw Delayed swallow initiation-vallecula Pharyngeal -- Pharyngeal- Puree Delayed swallow initiation-vallecula Pharyngeal -- Pharyngeal- Mechanical Soft Delayed swallow initiation-vallecula Pharyngeal -- Pharyngeal- Regular -- Pharyngeal -- Pharyngeal- Multi-consistency -- Pharyngeal -- Pharyngeal- Pill NT Pharyngeal -- Pharyngeal Comment --  No flowsheet data found. No flowsheet data found. Herbie Baltimore, [place] [phone] Lynann Beaver 07/23/2015, 11:02 AM              EVENTS: 3/25 -  Admit  3/26 - Revascularization in IR   STUDIES: CT HEAD W/O 3/25: No intracranial hemorrhage. Age-related atrophy & critical microvascular ischemic disease. CTA HEAD/NECK 3/25: Emergent large vessel occlusion at the L ICA terminus. Thrombus from left PICA extending into both M1 & A1 segments. Large area of L MCA perfusion abnormal. No arterial stenosis in neck. Bilateral upper lobe ground glass in the lung & septal thickening. MRI 3/26 >>Acute large LEFT anterior cerebral artery territory infarct. Acute small LEFT middle cerebral artery territory infarct, 3 mm LEFT-to-RIGHT midline shift.   MICROBIOLOGY: MRSA PCR 3/26: Negative  ANTIBIOTICS: Vancomycin 3/25 (surgical prophylaxis)  LINES/TUBES: OETT 7.5 3/25>>>3/28 Foley 3/25>>> PIV x2  I reviewed CXR myself, no evidence of acute disease.  ASSESSMENT / PLAN:  NEUROLOGIC A:   S/p revascularization of Lt ICA, MCA, ACA S/p CVA Residual anesthesia effects likely causing absent neuro exam  P:   Neurology & IR following. D/C all sedation.  PULMONARY A: Post op resp failure - ETT adjsuted H/O Asthma Failed swallow evaluation.  P:   Wheezing on exam, will give a single albuterol treatment, if resolves then will make PRN, if does not resolve then no further treatment needed. DNR.  CARDIOVASCULAR A:  Atrial Fibrillation - No RVR. Bradycardia Total occlusion of LT ICA, MCA, ACA H/O HTN  P:  Off  Cardene gtt   RENAL A:   Hypokalemia - Mild.  P:   IVfs post contrast Monitor UOP with foley Trending renal function & replete electrolytes daily as needed  GASTROINTESTINAL A:  No acute issues.  P:   NPO Protonix IV daily Swallow eval failed. May need NGT and restart TF, will defer to neuro.  HEMATOLOGIC A:   Thrombocytopenia - Mild.  P:  Trending cell counts daily w/ CBC SCDs  INFECTIOUS A:    No acute issues.  P: Monitor for signs of infection. Post surgical abx prophylaxis  ENDOCRINE A:    Hyperglycemia  - No h/o DM.  P:   Hgb A1c pending Accu-Checks q4hr SSI per algorithm  Discussed with neuro-MD and RT.  FAMILY DISCUSSION:  Family updated bedside.  DNR.  Transfer to med-surg, PCCM will sign off, please call back if needed.  Rush Farmer, M.D. [facility] Pulmonary/Critical Care Medicine. Pager: [phone]. After hours pager: [phone].  07/23/2015, 11:29 AM

## 2015-07-23 NOTE — Evaluation (Signed)
Speech Language Pathology Evaluation Patient Details Name: Joy Holden MRN: [id number] DOB: 12/22/1924 Today's Date: 07/23/2015 Time: 0815-0850 SLP Time Calculation (min) (ACUTE ONLY): 35 min  Problem List:  Patient Active Problem List   Diagnosis Date Noted  . CVA (cerebral infarction) 07/20/2015  . Acute ischemic stroke ([place]) 07/19/2015  . Essential hypertension 02/20/2015  . 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 Hypertension   . Cancer ([place])   . Asthma    Past Surgical History:  Past Surgical History  Procedure Laterality Date  . Appendectomy    . Abdominal hysterectomy      PARTIAL  . Cervical spine surgery    . Myomectomy    . Breast surgery    . Ectopic pregnancy surgery    . Neck surgery      c-spine  . Radiology with anesthesia N/A 07/19/2015    Procedure: RADIOLOGY WITH ANESTHESIA;  Surgeon: Luanne Bras, MD;  Location: [place];  Service: Radiology;  Laterality: N/A;   HPI:  Ms. Joy Holden is a 82 y.o. female with history of afib not on AC, HTN, asthma presenting with speech abnormality, right facial droop, and right hemiplegia. Pt recieved IV tPA on 3/25. MRI shows left MCA and ACA territory infarcts. Intubated from 3/25 to 3/28.    Assessment / Plan / Recommendation Clinical Impression  Pt demonstrates severe expressive aphasia and moderate receptive aphasia with additional right neglect and possible apraxic component. Pt is able to follow simple proximal and distal commands with better than 50% accuracy by the end of session. She can respond with a head nod or shake to familiar biographical information with 75% accuracy. She was able to identify familiar objects in a field of two with max assist fading to independence. She needed tactile assist to coordinate movements in the left hand. Pt was not able to initaite phonation for cough or speech, though she was observed to grope  for this action, sugggesting possible apraxia. She did purse lips x3 for speech with melodic intonation attempts. Discussed results at length with family with teaching for functional communication. Recommend going acute SLP therapy with CIR at d/c.    SLP Assessment  Patient needs continued Speech Lanaguage Pathology Services    Follow Up Recommendations  Inpatient Rehab    Frequency and Duration min 2x/week  2 weeks      SLP Evaluation Prior Functioning  Cognitive/Linguistic Baseline: Within functional limits Vocation: Retired Clinical biochemist)   Cognition  Overall Cognitive Status: Difficult to assess (due to aphasia) Arousal/Alertness: Awake/alert Orientation Level: Other (comment) (uta) Problem Solving: Impaired Problem Solving Impairment: Functional basic    Comprehension  Auditory Comprehension Overall Auditory Comprehension: Impaired Yes/No Questions: Impaired Basic Biographical Questions: 76-100% accurate Basic Immediate Environment Questions: 50-74% accurate Commands: Impaired One Step Basic Commands: 50-74% accurate Conversation: Simple Interfering Components: Visual impairments;Motor planning Reading Comprehension Reading Status: Not tested    Expression Verbal Expression Overall Verbal Expression: Impaired (could not initiate phonation of articulation) Initiation: Impaired Repetition: Impaired Level of Impairment: Word level Written Expression Dominant Hand: Right   Oral / Motor  Oral Motor/Sensory Function Overall Oral Motor/Sensory Function: Severe impairment Facial ROM: Suspected CN VII (facial) dysfunction;Reduced right Facial Symmetry: Abnormal symmetry left;Suspected CN VII (facial) dysfunction Facial Strength: Suspected CN VII (facial) dysfunction;Reduced right Facial Sensation:  (pt cannot report) Lingual ROM: Other (Comment) Mandible: Within Functional Limits Motor Speech Overall Motor Speech:  Impaired Phonation:  (cannot  initiate) Articulation: Impaired Motor Planning: Impaired Level of Impairment: Word   GO                   Herbie Baltimore, MA CCC-SLP [phone]  Lynann Beaver 07/23/2015, 9:26 AM

## 2015-07-23 NOTE — Progress Notes (Signed)
Met with pt and family this AM to discuss discharge planning.  Family states PTA, pt independent, lives alone.  PT now recommending SNF for rehab at dc.  Will consult CSW to facilitate possible dc to SNF when medically stable for dc.  Will follow progress.    Reinaldo Raddle, RN, BSN  Trauma/Neuro ICU Case Manager [phone]

## 2015-07-23 NOTE — Progress Notes (Signed)
**Note Joy-Identified via Obfuscation** Referring Physician(s): Dr Lavera Guise  Supervising Physician: Luanne Bras  Chief Complaint:  CVA L ICA/MCA/ACA clot retrieval 3/26  Subjective:  Extubated Seems to want to communicate but unable Tries to follow commands Can move left hand and left foot  Allergies: Penicillins; Aspirin; and Morphine  Medications: Prior to Admission medications   Medication Sig Start Date End Date Taking? Authorizing Provider  ACACIA GUM PO Place 1-2 tablets under the tongue every 4 (four) hours as needed (allergies). [facility] allergy relief   Yes Historical Provider, MD  Cranberry 140-100-3 MG-MG-UNIT CAPS Take 1 capsule by mouth daily. [facility]   Yes Historical Provider, MD  diltiazem (CARDIZEM CD) 180 MG 24 hr capsule Take 180 mg by mouth daily. 06/24/15  Yes Historical Provider, MD  GARLIC PO Take 0000000 mg by mouth 2 (two) times daily with a meal. "Kyolic"   Yes Historical Provider, MD  L-Arginine 500 MG CAPS Take 500 mg by mouth daily. [place]   Yes Historical Provider, MD  OVER THE COUNTER MEDICATION Take 480 mg by mouth 3 (three) times daily with meals. Stinging Nettles - [employer] [facility]   Yes Historical Provider, MD  OVER THE COUNTER MEDICATION Take 2 capsules by mouth 2 (two) times daily with a meal. Mushroom complex - [employer] [facility]   Yes Historical Provider, MD  OVER THE COUNTER MEDICATION Take 1 tablet by mouth 3 (three) times daily with meals. Ultra Sugar Aid - cliinically studied chromium - [facility]   Yes Historical Provider, MD  OVER THE COUNTER MEDICATION Take 3 tablets by mouth 2 (two) times daily. [place] for [employer]   Yes Historical Provider, MD  OVER THE COUNTER MEDICATION Take 2 capsules by mouth daily. Clinical Strength Blood Pressure Support - [place]   Yes Historical Provider, MD  OVER THE COUNTER MEDICATION Take 2 tablets by mouth 3 (three) times daily with meals. [place] - Advanced Ginkgo Extract  Eyes- [facility] [facility]   Yes Historical Provider, MD  OVER THE COUNTER MEDICATION Take 2 tablets by mouth daily. Heart Rescue - [place]   Yes Historical Provider, MD  OVER THE COUNTER MEDICATION Take 1 capsule by mouth daily. Turmeric 450 mg and Black Pepper 5 mg - Plnt   Yes Historical Provider, MD  OVER THE COUNTER MEDICATION Take 2 mLs by mouth daily. [place] for [employer]   Yes Historical Provider, MD  OVER THE COUNTER MEDICATION Take 1 mL by mouth daily. Supreme green tea - [facility]   Yes Historical Provider, MD  VANADYL SULFATE PO Take 2 mg by mouth daily. [employer] [facility]   Yes Historical Provider, MD  Vitamin Mixture (VITAMIN E-SELENIUM) 400-50 UNIT-MCG CAPS Take 1 capsule by mouth daily. [employer] [facility]   Yes Historical Provider, MD     Vital Signs: BP 182/93 mmHg  Pulse 85  Temp(Src) 98.5 F (36.9 C) (Oral)  Resp 19  Ht 5\' 2"  (1.575 m)  Wt 147 lb 7.8 oz (66.9 kg)  BMI 26.97 kg/m2  SpO2 97%  Physical Exam  Musculoskeletal:  can follow command of moving left Very weak No movement of rt Cannot speak Rt facial droop Cannot protrude tongue  Skin: Skin is warm and dry.  Nursing note and vitals reviewed.   Imaging: Ct Angio Head W/cm &/or Wo Cm  07/19/2015  CLINICAL DATA:  82 year old female code stroke patient with right side weakness. Initial encounter.  EXAM: CT ANGIOGRAPHY HEAD AND NECK CT CEREBRAL PERFUSION WITH CONTRAST TECHNIQUE: Multidetector CT imaging of the head and neck was performed using the standard protocol during bolus administration of intravenous contrast. Multiplanar CT image reconstructions and MIPs were obtained to evaluate the vascular anatomy. Carotid stenosis measurements (when applicable) are obtained utilizing NASCET criteria, using the distal internal carotid diameter as the denominator. Cerebral CT perfusion was also performed and images were post processed into perfusion maps. CONTRAST:  21mL OMNIPAQUE  IOHEXOL 350 MG/ML SOLN COMPARISON:  Noncontrast head CT 2133 hours today. FINDINGS: CT PERFUSION Left MCA territory abnormal mean-transit-time and time-to-peak with associated decreased cerebral blood flow. Cerebral blood volume is relatively preserved, suggesting compensated ischemia and penumbra. On CT profusion source images delayed collateral enhancement in the left MCA territory occurs. Furthermore, on the comparison noncontrast CT (reported separately) I estimate the ASPECTS score at 9 or 10, with little to no CT changes of left MCA ischemia. CTA NECK Skeleton: Osteopenia. No acute osseous abnormality identified. Other neck: Patchy ground-glass opacity with pulmonary septal thickening in the visualized upper lungs. No superior mediastinal lymphadenopathy. Negative thyroid, larynx, pharynx, parapharyngeal spaces, retropharyngeal space, sublingual space, submandibular glands and parotid glands. No cervical lymphadenopathy. Aortic arch: Slight bovine type arch configuration. Minimal arch atherosclerosis. No great vessel origin stenosis. Right carotid system: Tortuous right CCA in the lower neck. Minimal for age atherosclerosis at the right carotid bifurcation. Tortuous cervical right ICA, otherwise negative to the skullbase. Left carotid system: Minimal plaque at the left CCA origin. Tortuous proximal left CCA. Minimal plaque at the left carotid bifurcation which is widely patent. Tortuous cervical left ICA but otherwise negative to the skullbase. See intracranial findings below. Vertebral arteries:No proximal subclavian artery stenosis. Normal vertebral artery origins. Tortuous bilateral V1 segments. The right vertebral artery is mildly dominant. No vertebral artery stenosis to the skullbase. CTA HEAD Posterior circulation: Non dominant left vertebral artery is diminutive beyond the left PICA origin. Normal right PICA origin. Mild calcified and soft plaque in the distal right V4 segment resulting in mild  stenosis. Patent vertebrobasilar junction. Moderate proximal basilar artery irregularity and stenosis. Irregularity continues to the mid basilar artery. The distal basilar artery has a more normal appearance. Normal SCA and right PCA origins. Fetal type left PCA origin with enhancing left posterior communicating artery. The right posterior communicating artery is diminutive or absent. Bilateral PCA branches are irregular but patent. Anterior circulation: Patent right ICA siphon with calcified plaque. Mild right siphon stenosis. Patent right ICA terminus. Right MCA M1 segment is patent with mild to moderate irregularity. Right MCA bifurcation is patent with moderate irregularity which continues into the right MCA branches. Proximal left ICA siphon is patent but there is thrombus at the left ICA terminus extending from the level of the posterior communicating artery to the left MCA and ACA origins. Thrombus in both left M1 and A1 segments with reconstituted flow. Furthermore, the left A1 segment appears dominant. There is reconstituted flow at the anterior communicating artery and in the bilateral ACA branches which are irregular but patent. There is poor to intermediate degree of early reconstituted flow at the left MCA bifurcation. Attenuated left MCA M2 branches. Venous sinuses: Appear patent, but suboptimal venous contrast timing. Anatomic variants: Suspected dominant left ACA A1 segment. IMPRESSION: 1. Positive for emergent large vessel occlusion at the left ICA terminus. Thrombus from the level of the left posterior communicating artery extending into both the left M1 and A1 segments. Some early reconstituted left MCA and  ACA enhancement. 2. Large area of abnormal left MCA perfusion characteristics, but with relatively preserved cerebral blood volume and delayed collateral enhancement on the perfusion source images. 3. In conjunction with the noncontrast head CT (reported separately) the above constellation is  favorable for endovascular reperfusion. This was discussed by telephone with Dr. Wallie Char on 07/19/2015 at 2156 hours. 4. No superimposed arterial stenosis in the neck. Tortuous bilateral carotid arteries. 5. Superimposed intracranial atherosclerosis elsewhere including moderate proximal basilar stenosis, mild stenosis of the dominant distal right vertebral artery, and mild to moderate bilateral PCA and right MCA branch irregularity. 6. Bilateral upper lobe pulmonary ground-glass opacity and septal thickening may indicate developing interstitial edema. Electronically Signed   By: Genevie Ann M.D.   On: 07/19/2015 22:31   Ct Head Wo Contrast  07/20/2015  CLINICAL DATA:  Stroke, follow-up status post revascularization of LEFT internal carotid artery, LEFT middle cerebral and LEFT anterior cerebral arteries. EXAM: CT HEAD WITHOUT CONTRAST TECHNIQUE: Contiguous axial images were obtained from the base of the skull through the vertex without intravenous contrast. COMPARISON:  CT and CTA head July 19, 2015 FINDINGS: INTRACRANIAL CONTENTS: The ventricles and sulci are normal for age. No intraparenchymal hemorrhage, mass effect nor midline shift. Patchy supratentorial white matter hypodensities are less than expected for patient's age and though non-specific likely represent chronic small vessel ischemic disease. No acute large vascular territory infarcts. No abnormal extra-axial fluid collections. Basal cisterns are patent. Moderate calcific atherosclerosis of the carotid siphons. Intravascular residual contrast. ORBITS: The included ocular globes and orbital contents are non-suspicious. Status post bilateral ocular lens implants. SINUSES: The mastoid aircells and included paranasal sinuses are well-aerated. SKULL/SOFT TISSUES: No skull fracture. No significant soft tissue swelling. IMPRESSION: No acute intracranial process, status post cerebral angiogram with residual intravascular contrast. Involutional changes and  mild chronic small vessel ischemic disease. Electronically Signed   By: Elon Alas M.D.   On: 07/20/2015 01:31   Ct Head Wo Contrast  07/19/2015  CLINICAL DATA:  82 year old female found out site. Right-sided weakness. Code stroke. EXAM: CT HEAD WITHOUT CONTRAST TECHNIQUE: Contiguous axial images were obtained from the base of the skull through the vertex without intravenous contrast. COMPARISON:  None FINDINGS: There is mild prominence of the ventricles and sulci compatible with age-related atrophy. Moderate periventricular and deep white matter chronic microvascular ischemic changes noted. There is no acute intracranial hemorrhage. No mass effect or midline shift noted. Stop the visualized paranasal sinuses and mastoid air cells are clear. The calvarium is intact. IMPRESSION: No acute intracranial hemorrhage. Age-related atrophy and chronic microvascular ischemic disease. If symptoms persist and there are no contraindications, MRI may provide better evaluation if clinically indicated. Electronically Signed   By: Anner Crete M.D.   On: 07/19/2015 21:56   Ct Angio Neck W/cm &/or Wo/cm  07/19/2015  CLINICAL DATA:  82 year old female code stroke patient with right side weakness. Initial encounter. EXAM: CT ANGIOGRAPHY HEAD AND NECK CT CEREBRAL PERFUSION WITH CONTRAST TECHNIQUE: Multidetector CT imaging of the head and neck was performed using the standard protocol during bolus administration of intravenous contrast. Multiplanar CT image reconstructions and MIPs were obtained to evaluate the vascular anatomy. Carotid stenosis measurements (when applicable) are obtained utilizing NASCET criteria, using the distal internal carotid diameter as the denominator. Cerebral CT perfusion was also performed and images were post processed into perfusion maps. CONTRAST:  72mL OMNIPAQUE IOHEXOL 350 MG/ML SOLN COMPARISON:  Noncontrast head CT 2133 hours today. FINDINGS: CT PERFUSION Left MCA territory abnormal  mean-transit-time and time-to-peak with associated decreased cerebral blood flow. Cerebral blood volume is relatively preserved, suggesting compensated ischemia and penumbra. On CT profusion source images delayed collateral enhancement in the left MCA territory occurs. Furthermore, on the comparison noncontrast CT (reported separately) I estimate the ASPECTS score at 9 or 10, with little to no CT changes of left MCA ischemia. CTA NECK Skeleton: Osteopenia. No acute osseous abnormality identified. Other neck: Patchy ground-glass opacity with pulmonary septal thickening in the visualized upper lungs. No superior mediastinal lymphadenopathy. Negative thyroid, larynx, pharynx, parapharyngeal spaces, retropharyngeal space, sublingual space, submandibular glands and parotid glands. No cervical lymphadenopathy. Aortic arch: Slight bovine type arch configuration. Minimal arch atherosclerosis. No great vessel origin stenosis. Right carotid system: Tortuous right CCA in the lower neck. Minimal for age atherosclerosis at the right carotid bifurcation. Tortuous cervical right ICA, otherwise negative to the skullbase. Left carotid system: Minimal plaque at the left CCA origin. Tortuous proximal left CCA. Minimal plaque at the left carotid bifurcation which is widely patent. Tortuous cervical left ICA but otherwise negative to the skullbase. See intracranial findings below. Vertebral arteries:No proximal subclavian artery stenosis. Normal vertebral artery origins. Tortuous bilateral V1 segments. The right vertebral artery is mildly dominant. No vertebral artery stenosis to the skullbase. CTA HEAD Posterior circulation: Non dominant left vertebral artery is diminutive beyond the left PICA origin. Normal right PICA origin. Mild calcified and soft plaque in the distal right V4 segment resulting in mild stenosis. Patent vertebrobasilar junction. Moderate proximal basilar artery irregularity and stenosis. Irregularity continues to the  mid basilar artery. The distal basilar artery has a more normal appearance. Normal SCA and right PCA origins. Fetal type left PCA origin with enhancing left posterior communicating artery. The right posterior communicating artery is diminutive or absent. Bilateral PCA branches are irregular but patent. Anterior circulation: Patent right ICA siphon with calcified plaque. Mild right siphon stenosis. Patent right ICA terminus. Right MCA M1 segment is patent with mild to moderate irregularity. Right MCA bifurcation is patent with moderate irregularity which continues into the right MCA branches. Proximal left ICA siphon is patent but there is thrombus at the left ICA terminus extending from the level of the posterior communicating artery to the left MCA and ACA origins. Thrombus in both left M1 and A1 segments with reconstituted flow. Furthermore, the left A1 segment appears dominant. There is reconstituted flow at the anterior communicating artery and in the bilateral ACA branches which are irregular but patent. There is poor to intermediate degree of early reconstituted flow at the left MCA bifurcation. Attenuated left MCA M2 branches. Venous sinuses: Appear patent, but suboptimal venous contrast timing. Anatomic variants: Suspected dominant left ACA A1 segment. IMPRESSION: 1. Positive for emergent large vessel occlusion at the left ICA terminus. Thrombus from the level of the left posterior communicating artery extending into both the left M1 and A1 segments. Some early reconstituted left MCA and ACA enhancement. 2. Large area of abnormal left MCA perfusion characteristics, but with relatively preserved cerebral blood volume and delayed collateral enhancement on the perfusion source images. 3. In conjunction with the noncontrast head CT (reported separately) the above constellation is favorable for endovascular reperfusion. This was discussed by telephone with Dr. Wallie Char on 07/19/2015 at 2156 hours. 4. No  superimposed arterial stenosis in the neck. Tortuous bilateral carotid arteries. 5. Superimposed intracranial atherosclerosis elsewhere including moderate proximal basilar stenosis, mild stenosis of the dominant distal right vertebral artery, and mild to moderate bilateral PCA and right MCA branch irregularity.  6. Bilateral upper lobe pulmonary ground-glass opacity and septal thickening may indicate developing interstitial edema. Electronically Signed   By: Genevie Ann M.D.   On: 07/19/2015 22:31   Mr Brain Wo Contrast  07/20/2015  CLINICAL DATA:  Stroke, status post revascularization of LEFT internal carotid artery and LEFT anterior and middle cerebral arteries. History of atrial fibrillation, hypertension and cancer. EXAM: MRI HEAD WITHOUT CONTRAST TECHNIQUE: Multiplanar, multiecho pulse sequences of the brain and surrounding structures were obtained without intravenous contrast. COMPARISON:  CT head July 20, 2015 FINDINGS: Confluent reduced diffusion LEFT frontal lobe, predominately medial. Patchy reduced diffusion LEFT mesial temporal lobe LEFT basal ganglia, LEFT posterior frontal lobe with corresponding low ADC values and bright T2 FLAIR. 3 mm LEFT-to-RIGHT midline shift. No susceptibility artifact to suggest hemorrhage. Ventricles and sulci are overall normal for patient's age. Patchy FLAIR T2 hyperintense signal exclusive of the aforementioned abnormality. Old Small LEFT cerebellar infarct. No abnormal extra-axial fluid collections. Normal major intracranial vascular flow voids present at skull base. Status post bilateral ocular lens implants. No abnormal sellar expansion. No cerebellar tonsillar ectopia. No suspicious calvarial bone marrow signal. IMPRESSION: Acute large LEFT anterior cerebral artery territory infarct. Acute small LEFT middle cerebral artery territory infarct, predominately involving the basal ganglia and LEFT frontal lobe. 3 mm LEFT-to-RIGHT midline shift. Moderate chronic small vessel  ischemic disease and old small LEFT cerebellar infarct. These results will be called to the ordering clinician or representative by the Radiologist Assistant, and communication documented in the PACS or zVision Dashboard. Electronically Signed   By: Elon Alas M.D.   On: 07/20/2015 22:25   Ct Cerebral Perfusion W/cm  07/19/2015  CLINICAL DATA:  82 year old female code stroke patient with right side weakness. Initial encounter. EXAM: CT ANGIOGRAPHY HEAD AND NECK CT CEREBRAL PERFUSION WITH CONTRAST TECHNIQUE: Multidetector CT imaging of the head and neck was performed using the standard protocol during bolus administration of intravenous contrast. Multiplanar CT image reconstructions and MIPs were obtained to evaluate the vascular anatomy. Carotid stenosis measurements (when applicable) are obtained utilizing NASCET criteria, using the distal internal carotid diameter as the denominator. Cerebral CT perfusion was also performed and images were post processed into perfusion maps. CONTRAST:  12mL OMNIPAQUE IOHEXOL 350 MG/ML SOLN COMPARISON:  Noncontrast head CT 2133 hours today. FINDINGS: CT PERFUSION Left MCA territory abnormal mean-transit-time and time-to-peak with associated decreased cerebral blood flow. Cerebral blood volume is relatively preserved, suggesting compensated ischemia and penumbra. On CT profusion source images delayed collateral enhancement in the left MCA territory occurs. Furthermore, on the comparison noncontrast CT (reported separately) I estimate the ASPECTS score at 9 or 10, with little to no CT changes of left MCA ischemia. CTA NECK Skeleton: Osteopenia. No acute osseous abnormality identified. Other neck: Patchy ground-glass opacity with pulmonary septal thickening in the visualized upper lungs. No superior mediastinal lymphadenopathy. Negative thyroid, larynx, pharynx, parapharyngeal spaces, retropharyngeal space, sublingual space, submandibular glands and parotid glands. No  cervical lymphadenopathy. Aortic arch: Slight bovine type arch configuration. Minimal arch atherosclerosis. No great vessel origin stenosis. Right carotid system: Tortuous right CCA in the lower neck. Minimal for age atherosclerosis at the right carotid bifurcation. Tortuous cervical right ICA, otherwise negative to the skullbase. Left carotid system: Minimal plaque at the left CCA origin. Tortuous proximal left CCA. Minimal plaque at the left carotid bifurcation which is widely patent. Tortuous cervical left ICA but otherwise negative to the skullbase. See intracranial findings below. Vertebral arteries:No proximal subclavian artery stenosis. Normal vertebral artery origins. Tortuous  bilateral V1 segments. The right vertebral artery is mildly dominant. No vertebral artery stenosis to the skullbase. CTA HEAD Posterior circulation: Non dominant left vertebral artery is diminutive beyond the left PICA origin. Normal right PICA origin. Mild calcified and soft plaque in the distal right V4 segment resulting in mild stenosis. Patent vertebrobasilar junction. Moderate proximal basilar artery irregularity and stenosis. Irregularity continues to the mid basilar artery. The distal basilar artery has a more normal appearance. Normal SCA and right PCA origins. Fetal type left PCA origin with enhancing left posterior communicating artery. The right posterior communicating artery is diminutive or absent. Bilateral PCA branches are irregular but patent. Anterior circulation: Patent right ICA siphon with calcified plaque. Mild right siphon stenosis. Patent right ICA terminus. Right MCA M1 segment is patent with mild to moderate irregularity. Right MCA bifurcation is patent with moderate irregularity which continues into the right MCA branches. Proximal left ICA siphon is patent but there is thrombus at the left ICA terminus extending from the level of the posterior communicating artery to the left MCA and ACA origins. Thrombus in  both left M1 and A1 segments with reconstituted flow. Furthermore, the left A1 segment appears dominant. There is reconstituted flow at the anterior communicating artery and in the bilateral ACA branches which are irregular but patent. There is poor to intermediate degree of early reconstituted flow at the left MCA bifurcation. Attenuated left MCA M2 branches. Venous sinuses: Appear patent, but suboptimal venous contrast timing. Anatomic variants: Suspected dominant left ACA A1 segment. IMPRESSION: 1. Positive for emergent large vessel occlusion at the left ICA terminus. Thrombus from the level of the left posterior communicating artery extending into both the left M1 and A1 segments. Some early reconstituted left MCA and ACA enhancement. 2. Large area of abnormal left MCA perfusion characteristics, but with relatively preserved cerebral blood volume and delayed collateral enhancement on the perfusion source images. 3. In conjunction with the noncontrast head CT (reported separately) the above constellation is favorable for endovascular reperfusion. This was discussed by telephone with Dr. Wallie Char on 07/19/2015 at 2156 hours. 4. No superimposed arterial stenosis in the neck. Tortuous bilateral carotid arteries. 5. Superimposed intracranial atherosclerosis elsewhere including moderate proximal basilar stenosis, mild stenosis of the dominant distal right vertebral artery, and mild to moderate bilateral PCA and right MCA branch irregularity. 6. Bilateral upper lobe pulmonary ground-glass opacity and septal thickening may indicate developing interstitial edema. Electronically Signed   By: Genevie Ann M.D.   On: 07/19/2015 22:31   Dg Chest Port 1 View  07/20/2015  CLINICAL DATA:  82 year old female status post intubation. EXAM: PORTABLE CHEST 1 VIEW COMPARISON:  Chest radiograph dated 09/11/2009 FINDINGS: An endotracheal tube is noted with tip approximately 5 cm above the carina. Patchy areas of hazy density at the  left lung base and perihilar region are concerning for pneumonia. There is diffuse interstitial prominence, left greater right which may be related to underlying congestive changes or edema. Trace fluid noted in the right minor fissure. No significant pleural effusion. No pneumothorax. Mild cardiomegaly. No acute osseous pathology. IMPRESSION: Left lung base and right hilar hazy densities concerning for pneumonia superimposed on background of congestion/edema. Clinical correlation and follow-up recommended. Endotracheal tube above the carina. Electronically Signed   By: Anner Crete M.D.   On: 07/20/2015 06:46   Dg Abd Portable 1v  07/21/2015  CLINICAL DATA:  82 year old female with enteric tube adjustment EXAM: PORTABLE ABDOMEN - 1 VIEW COMPARISON:  Radiograph dated 07/21/2015 FINDINGS: There  has been interval advancement of the enteric tube with tip now in the inferior left hemi abdomen adjacent to the left L4 pedicle and likely within the stomach. There is no bowel dilatation or free air. No radiopaque calculi identified. There is degenerative changes of the spine. No acute fracture. IMPRESSION: Interval advancement of the enteric tube the tip likely within the stomach. Electronically Signed   By: Anner Crete M.D.   On: 07/21/2015 21:29   Dg Abd Portable 1v  07/21/2015  CLINICAL DATA:  Orogastric tube placement EXAM: PORTABLE ABDOMEN - 1 VIEW COMPARISON:  Portable exam 1734 hours without priors for comparison FINDINGS: Tip of orogastric tube projects over stomach but the proximal side-port projects over distal esophagus ; recommend advancing tube 5 cm to place proximal side-port within stomach. Atelectasis versus consolidation LEFT lower lobe. Visualized bowel gas pattern normal. Bones demineralized. IMPRESSION: Proximal side-port of orogastric tube projects over stomach ; recommend advancing tube 5 cm. Findings called to [facility] on 07/21/2015 at 1858 hours. Electronically Signed    By: Lavonia Dana M.D.   On: 07/21/2015 18:59   Dg Swallowing Func-speech Pathology  07/23/2015  Objective Swallowing Evaluation: Type of Study: MBS-Modified Barium Swallow Study Patient Details Name: Joy Holden MRN: [id number] Date of Birth: 1924-12-20 Today's Date: 07/23/2015 Time: SLP Start Time (ACUTE ONLY): 1040-SLP Stop Time (ACUTE ONLY): 1055 SLP Time Calculation (min) (ACUTE ONLY): 15 min Past Medical History: Past Medical History Diagnosis Date . Hypertension  . Cancer ([place])  . Asthma  Past Surgical History: Past Surgical History Procedure Laterality Date . Appendectomy   . Abdominal hysterectomy     PARTIAL . Cervical spine surgery   . Myomectomy   . Breast surgery   . Ectopic pregnancy surgery   . Neck surgery     c-spine . Radiology with anesthesia N/A 07/19/2015   Procedure: RADIOLOGY WITH ANESTHESIA;  Surgeon: Luanne Bras, MD;  Location: [facility];  Service: Radiology;  Laterality: N/A; HPI: Joy Holden is a 82 y.o. female with history of afib not on AC, HTN, asthma presenting with speech abnormality, right facial droop, and right hemiplegia. Pt recieved IV tPA on 3/25. MRI shows left MCA and ACA territory infarcts. Intubated from 3/25 to 3/28.  No Data Recorded Assessment / Plan / Recommendation CHL IP CLINICAL IMPRESSIONS 07/23/2015 Therapy Diagnosis -- Clinical Impression Pt demonstrates potential for oral dysphagia as pt was unwilling to take more than tiny bites, which family states is her baseline behavior when eating. Suspect potential for right sided pocketing during meal given overt weakness. Oropharyngeal dysphagia characterized by delayed swallow initation with risk for apsiraiton before the swallow with larger boluses. This did not occur during the study. Pt was noted to cough after POs given, as she did in the room, but no penetration or aspiration was observed. Recommend a dys 2 (fine chop) diet with thin liquids via straw with full supervision and aspiration precautions.   Provided instruction to family during exam on feeding pt and appropriate diet choices. SLP will follow for tolerance.  Impact on safety and function Mild aspiration risk   CHL IP TREATMENT RECOMMENDATION 07/23/2015 Treatment Recommendations Therapy as outlined in treatment plan below   Prognosis 07/23/2015 Prognosis for Safe Diet Advancement Good Barriers to Reach Goals -- Barriers/Prognosis Comment -- CHL IP DIET RECOMMENDATION 07/23/2015 SLP Diet Recommendations Thin liquid;Dysphagia 2 (Fine chop) solids Liquid Administration via Cup;Straw Medication Administration Crushed with puree Compensations Slow rate;Small sips/bites;Minimize environmental distractions Postural Changes  Seated upright at 90 degrees   CHL IP OTHER RECOMMENDATIONS 07/23/2015 Recommended Consults -- Oral Care Recommendations Oral care BID Other Recommendations --   CHL IP FOLLOW UP RECOMMENDATIONS 07/23/2015 Follow up Recommendations Inpatient Rehab   CHL IP FREQUENCY AND DURATION 07/23/2015 Speech Therapy Frequency (ACUTE ONLY) min 2x/week Treatment Duration 2 weeks      CHL IP ORAL PHASE 07/23/2015 Oral Phase WFL Oral - Pudding Teaspoon -- Oral - Pudding Cup -- Oral - Honey Teaspoon -- Oral - Honey Cup -- Oral - Nectar Teaspoon -- Oral - Nectar Cup -- Oral - Nectar Straw -- Oral - Thin Teaspoon -- Oral - Thin Cup -- Oral - Thin Straw -- Oral - Puree -- Oral - Mech Soft -- Oral - Regular -- Oral - Multi-Consistency -- Oral - Pill -- Oral Phase - Comment --  CHL IP PHARYNGEAL PHASE 07/23/2015 Pharyngeal Phase Impaired Pharyngeal- Pudding Teaspoon -- Pharyngeal -- Pharyngeal- Pudding Cup -- Pharyngeal -- Pharyngeal- Honey Teaspoon -- Pharyngeal -- Pharyngeal- Honey Cup -- Pharyngeal -- Pharyngeal- Nectar Teaspoon -- Pharyngeal -- Pharyngeal- Nectar Cup Delayed swallow initiation-vallecula Pharyngeal -- Pharyngeal- Nectar Straw Delayed swallow initiation-vallecula Pharyngeal -- Pharyngeal- Thin Teaspoon -- Pharyngeal -- Pharyngeal- Thin Cup -- Pharyngeal  -- Pharyngeal- Thin Straw Delayed swallow initiation-vallecula Pharyngeal -- Pharyngeal- Puree Delayed swallow initiation-vallecula Pharyngeal -- Pharyngeal- Mechanical Soft Delayed swallow initiation-vallecula Pharyngeal -- Pharyngeal- Regular -- Pharyngeal -- Pharyngeal- Multi-consistency -- Pharyngeal -- Pharyngeal- Pill NT Pharyngeal -- Pharyngeal Comment --  No flowsheet data found. No flowsheet data found. Herbie [place], MA CCC-SLP [phone] Lynann Beaver 07/23/2015, 11:02 AM              Ir Percutaneous Art Thrombectomy/infusion Intracranial Inc Diag Angio  07/23/2015  CLINICAL DATA:  Left-sided gaze deviation. Right sided hemiplegia. Occluded left ICA, left MCA, and left ACA on CT angiogram. EXAM: IR PERCUTANEOUS ART THORMBECTOMY/INFUSION INTRACRANIAL INCLUDE DIAG ANGIO. ENDOVASCULAR COMPLETE REVASCULARIZATION OF OCCLUDED LEFT INTERNAL CAROTID ARTERY TERMINUS, THE LEFT MIDDLE CEREBRAL ARTERY AND THE LEFT ANTERIOR CEREBRAL ARTERY A1 SEGMENT WITH SUPERSELECTIVE INTRACRANIAL INTRA-ARTERIAL TPA AND MECHANICAL THROMBECTOMY: PROCEDURE: Contrast: 70 mL OMNIPAQUE IOHEXOL 300 MG/ML  SOLN As per general anesthesia. Following a full explanation of the procedure along with the potential associated complications, an informed witnessed consent was obtained. Risks of intracranial hemorrhage of 10-15%, worsening neurological deficit, ventilator dependency, death and inability to revascularize were all discussed in detail with the patient's family. Informed consent was obtained. The patient was put under general anesthesia by the Department of Anesthesiology at [facility]. The right groin was prepped and draped in the usual sterile fashion. Thereafter using modified Seldinger technique, transfemoral access into the right common femoral artery was obtained without difficulty. Over a 0.035 inch guidewire a 5 French Pinnacle sheath was inserted. Through this, and also over a 0.035 inch guidewire a 5 [place]  JB 1 catheter was advanced to the aortic arch region and selectively positioned in the left common carotid artery. An arteriogram was then performed centered over the carotid bifurcation and intracranially. There were no acute complications. The patient tolerated the procedure well. FINDINGS: The left common carotid arteriogram demonstrates the left external carotid artery and its major branches to be widely patent. The left internal carotid artery at the bulb to the cranial skull base was seen to opacify normally with mild tortuosity in the distal cervical segment. However slowly ascent of contrast was noted in the petrous and the cavernous segments. 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 ENDOVASCULAR COMPLETE REVASCULARIZATION OF OCCLUDED LEFT INTERNAL CAROTID ARTERY TERMINUS, AND LEFT MIDDLE CEREBRAL ARTERY AND LEFT ANTERIOR CEREBRAL ARTERY WITH 5 MG OF SUPERSELECTIVE INTRACRANIAL AND INTRAARTERIAL TPA AND 1 PASS WITH THE SOLITAIRE FR 4 X 40 MM RETRIEVAL DEVICE: The diagnostic JB 1 catheter in the left common carotid artery was exchanged over a 0.035 inch 300 cm Rosen exchange guidewire for an 8 French 55 cm BriteTip neurovascular sheath using biplane roadmap technique and constant fluoroscopic guidance. Good aspiration was obtained from the hub of the 8 [place] BriteTip neurovascular sheath. This was then connected to continuous heparinized saline infusion. Over a 0.035 inch Rosen exchange guidewire, an 8 French 80 cm Flowgate balloon guide catheter which had been prepped with 50% contrast and 50% heparinized saline was then advanced and positioned in the left common carotid artery just proximal to the bifurcation. The guidewire was removed. Good aspiration was obtained from the hub of the 8 [place] Flowgate guide catheter. A gentle contrast injection demonstrated no evidence spasms, dissections or of intraluminal  filling defects. Over a 0.035 inch Roadrunner guidewire, using biplane roadmap technique and constant fluoroscopic guidance, the 8 [place] Flowgate guide catheter was then advanced to the junction of the distal third and middle third of the left ICA. The guidewire was removed. Good aspiration was obtained from the hub of the 8 [place] Flowgate guide catheter. A gentle contrast injection demonstrated no evidence spasms, dissections or of intraluminal filling defects. A control arteriogram performed intracranially demonstrated no change in the occluded left internal carotid artery. At this time, in a coaxial manner and with constant heparinized saline infusion using biplane roadmap technique and constant fluoroscopic guidance, a combination of a 5 [place] Catalyst 115 cm guide catheter inside of which was a Trevo-ProVue 021 microcatheter was advanced over a 0.014 inch Softip Synchro micro guidewire to the distal end of the Flowgate guide catheter. With the micro guidewire leading with a J-tip configuration, the combination was then navigated with a torque device into the supraclinoid left ICA. The micro guidewire was removed. Approximately 5 mg of superselective intracranial intra-arterial tPA dissolved in 5 cc of normal saline was then infused over about 6 minutes into the clot. Over a 0.014 inch Softip Synchro micro guidewire, the microcatheter combination was then advanced into the M2-M3 region of the dominant superior division of the left middle cerebral artery. The guidewire was removed. Good aspiration was obtained from the hub of the microcatheter. A gentle contrast injection demonstrated antegrade flow distally. At this time, a 4 x 40 mm Solitaire FR retrieval device was then advanced in a coaxial manner and with constant heparinized saline infusion using biplane roadmap technique and constant fluoroscopic guidance to the distal end of the microcatheter. The O rings on the delivery microcatheter and the delivery  micro guidewire were then loosened. With slight forward gentle traction with the right hand on the delivery micro guidewire with the left hand, the delivery microcatheter was then gently retrieved unsheathing the retrieval device. A control arteriogram performed through the 5 [place] Catalyst guide catheter demonstrated partial revascularization of the occluded left internal carotid artery terminus. Large filling defect remained within the supraclinoid left ICA segment. The proximal portion of the device was then captured into the microcatheter. The balloon was then inflated in the left internal carotid artery for proximal flow arrest. Thereafter using constant aspiration with a 60 mL syringe at the side port of the 8  [place] Flowgate guide catheter, the combination of the delivery micro guidewire, the retrieval and the 5 [place] Catalyst guide catheter were then gently retrieved and removed. Clot was noted within the Tuohy-Borst. Aspiration with a 60 mL syringe was continued as the balloon was then deflated in the left internal carotid artery. The aspirate also demonstrated two chunks of clot. A control arteriogram performed through the 8 [place] Flowgate guide catheter in the left internal carotid artery demonstrated complete revascularization of the left ICA supraclinoid segment, the left middle cerebral artery and the left anterior cerebral artery proximally. The delayed arterial run continued to demonstrate a distal left anterior cerebral artery slow flow which was also noted on the initial CT angiogram of the brain. 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 The 8 French neurovascular sheath was then retrieved into the abdominal aorta as was the Flowgate guide catheter. These were then exchanged are over a J-tip guidewire for a 9 French Pinnacle sheath which was then connected to continuous heparinized saline infusion. Throughout the procedure, the  patient's hemodynamic status, the heart rate and neurological status remained stable. No angiographic evidence of extravasation was seen. No mass effect was noted on the intracranial vasculature. The patient was then transported to the CT scanner for postprocedural CT scan of brain. IMPRESSION: Status post complete revascularization of the occluded left internal carotid artery terminus, the left middle cerebral artery and the proximal left anterior cerebral artery using 5 mg of super selective intracranial intraarterial tPA, and one pass with the Solitaire FR 4 x 40 mm retrieval device achieving a TICI 2 b reperfusion. Electronically Signed   By: Luanne Bras M.D.   On: 07/21/2015 09:36    Labs:  CBC:  Recent Labs  07/20/15 0450 07/21/15 0332 07/22/15 0305 07/23/15 0746  WBC 6.8 9.3 8.2 7.7  HGB 12.6 13.1 12.9 13.7  HCT 39.3 40.4 40.7 41.8  PLT 117* 122* 101* 101*    COAGS:  Recent Labs  07/19/15 2121  INR 1.20  APTT 31    BMP:  Recent Labs  07/20/15 0450 07/21/15 0332 07/22/15 0305 07/23/15 1305  NA 141 144 144 150*  K 3.2* 4.0 3.2* 3.8  CL 108 113* 114* 116*  CO2 22 20* 23 24  GLUCOSE 197* 121* 130* 128*  BUN 20 21* 18 18  CALCIUM 8.4* 8.7* 8.4* 9.2  CREATININE 0.76 0.74 0.68 0.64  GFRNONAA >60 >60 >60 >60  GFRAA >60 >60 >60 >60    LIVER FUNCTION TESTS:  Recent Labs  07/19/15 2121 07/21/15 0332  BILITOT 0.7  --   AST 25  --   ALT 26  --   ALKPHOS 107  --   PROT 7.2  --   ALBUMIN 4.2 3.2*    Assessment and Plan:  CVA L ICA/MCA/ACA clot retrieval 3/26 in IR with Dr Estanislado Pandy Plan per Stroke team Will follow  Electronically Signed: Xan Ingraham A 07/23/2015, 3:14 PM   I spent a total of 15 Minutes at the the patient's bedside AND on the patient's hospital floor or unit, greater than 50% of which was counseling/coordinating care for CVA

## 2015-07-23 NOTE — Progress Notes (Signed)
RECEIVED PT FROM 30M, SEDATE, RESPONSIVE TO TOUCH AND VOICE. R HEMIPLEGIA. DID FOLLOW COMMAND TO KEEP LUE ELEVATED. BP ELEVATED AND LABETOLOL 10 MG GIVEN, FOLLOWED UP WITH ANOTHER 20MG . AFIB PER MONITOR. REFUSING PO INTAKE .

## 2015-07-23 NOTE — Evaluation (Signed)
Clinical/Bedside Swallow Evaluation Patient Details  Name: Joy Holden MRN: [id number] Date of Birth: 1924/06/01  Today's Date: 07/23/2015 Time: SLP Start Time (ACUTE ONLY): 0815 SLP Stop Time (ACUTE ONLY): 0854 SLP Time Calculation (min) (ACUTE ONLY): 39 min  Past Medical History:  Past Medical History  Diagnosis Date  . Hypertension   . Cancer ([place])   . Asthma    Past Surgical History:  Past Surgical History  Procedure Laterality Date  . Appendectomy    . Abdominal hysterectomy      PARTIAL  . Cervical spine surgery    . Myomectomy    . Breast surgery    . Ectopic pregnancy surgery    . Neck surgery      c-spine  . Radiology with anesthesia N/A 07/19/2015    Procedure: RADIOLOGY WITH ANESTHESIA;  Surgeon: Luanne Bras, MD;  Location: [facility];  Service: Radiology;  Laterality: N/A;   HPI:  Joy Holden is a 82 y.o. female with history of afib not on AC, HTN, asthma presenting with speech abnormality, right facial droop, and right hemiplegia. Pt recieved IV tPA on 3/25. MRI shows left MCA and ACA territory infarcts. Intubated from 3/25 to 3/28.    Assessment / Plan / Recommendation Clinical Impression  Pt demonstrates signs concerning for motor and sensory oral and oropharyngeal impairment. Pt is alert and initiating self feeding with assist. CNVII weakness results in decreased labial seal with anterior bolus loss. Swallow is likely only minimally delayed, but multiple swallows are observed concerning for pharyngeal weakness. No immediate coughing observed, but late hard coughing concerning for impaired laryngeal sensation, which could be secondary to CVA or 4 day intubation. Recommend objective testing to determine safest diet recommendations.     Aspiration Risk  Severe aspiration risk    Diet Recommendation NPO        Other  Recommendations Oral Care Recommendations: Oral care QID   Follow up Recommendations  Inpatient Rehab    Frequency and Duration            Prognosis        Swallow Study   General HPI: Joy Holden is a 82 y.o. female with history of afib not on AC, HTN, asthma presenting with speech abnormality, right facial droop, and right hemiplegia. Pt recieved IV tPA on 3/25. MRI shows left MCA and ACA territory infarcts. Intubated from 3/25 to 3/28.  Type of Study: Bedside Swallow Evaluation Previous Swallow Assessment: none Diet Prior to this Study: NPO Temperature Spikes Noted: No Respiratory Status: Nasal cannula History of Recent Intubation: Yes Length of Intubations (days): 4 days Date extubated: 07/22/15 Behavior/Cognition: Alert;Cooperative Self-Feeding Abilities: Able to feed self;Needs assist Patient Positioning: Upright in bed Baseline Vocal Quality: Not observed (pt is aphasic, mildly striderous respirations) Volitional Cough: Cognitively unable to elicit (attempted, could not initiate) Volitional Swallow: Unable to elicit    Oral/Motor/Sensory Function Overall Oral Motor/Sensory Function: Severe impairment Facial ROM: Suspected CN VII (facial) dysfunction;Reduced right Facial Symmetry: Abnormal symmetry left;Suspected CN VII (facial) dysfunction Facial Strength: Suspected CN VII (facial) dysfunction;Reduced right Facial Sensation:  (pt cannot report) Lingual ROM: Other (Comment) (pt does not protrude tongue) Mandible: Within Functional Limits (able to masticate ice)   Ice Chips Ice chips: Within functional limits Presentation: Spoon Other Comments: pt masticated and swallowed, no evidence of aspiration   Thin Liquid Thin Liquid: Impaired Presentation: Cup;Self Fed Oral Phase Impairments: Reduced labial seal Oral Phase Functional Implications: Right anterior spillage Pharyngeal  Phase  Impairments: Multiple swallows;Cough - Delayed;Suspected delayed Swallow    Nectar Thick Nectar Thick Liquid: Not tested   Honey Thick Honey Thick Liquid: Not tested   Puree Puree: Not tested   Solid   GO    Solid: Not tested       Herbie [place], MA CCC-SLP [phone]  Lynann Beaver 07/23/2015,9:15 AM

## 2015-07-23 NOTE — Evaluation (Signed)
Physical Therapy Evaluation Patient Details Name: Joy Holden MRN: [id number] DOB: [birth year] Today's Date: 07/23/2015   History of Present Illness  This 82 y.o. female admitted with abnormal speech, Rt facial droop, and Rt hemiplegia.  MRI showed Lt MCA and ACA territory infarcts.  She received tPA and mechanical thrombectomy with revasculization.  PMH includes:  A-Fib, HTN, Asthma   Clinical Impression  Pt non-verbal throughout session and with no participation in mobility.  Pt occasionally follows one-step directions, but not consistently.  Pt's BP supine prior to mobility was 177/76 with MAP of 105, while sitting EOB 165/107 with MAP of 123, and after returning to supine 197/91 with MAP of 121.  Feel pt would benefit from SNF level of therapies at D/C to decrease burden of care.  Will continue to follow.      Follow Up Recommendations SNF    Equipment Recommendations  None recommended by PT    Recommendations for Other Services       Precautions / Restrictions Precautions Precautions: Fall Restrictions Weight Bearing Restrictions: No      Mobility  Bed Mobility Overal bed mobility: Needs Assistance;+2 for physical assistance Bed Mobility: Supine to Sit;Sit to Supine     Supine to sit: Total assist;+2 for physical assistance;HOB elevated Sit to supine: Total assist;+2 for physical assistance   General bed mobility comments: No attempt to A with mobility.    Transfers                    Ambulation/Gait                Stairs            Wheelchair Mobility    Modified Rankin (Stroke Patients Only) Modified Rankin (Stroke Patients Only) Pre-Morbid Rankin Score: Slight disability Modified Rankin: Severe disability     Balance Overall balance assessment: Needs assistance Sitting-balance support: No upper extremity supported;Feet supported Sitting balance-Leahy Scale: Poor Sitting balance - Comments: pt leans posteriorly and did not  demonstrate any ability to participate in maintaining sitting balance.  Needs ModA to MaxA throughout session.  Attempted to have pt work on [employer] activity with L UE, but pt needs hand over hand A for reaching.  Worked on weightbearing through R UE by proping on R elbow.                                     Pertinent Vitals/Pain Pain Assessment: Faces Faces Pain Scale: No hurt    Home Living Family/patient expects to be discharged to:: Unsure                      Prior Function           Comments: No family present and pt unable to provide info.  Per chart, pt was independent and was driving PTA      Hand Dominance        Extremity/Trunk Assessment   Upper Extremity Assessment: Defer to OT evaluation           Lower Extremity Assessment: RLE deficits/detail;LLE deficits/detail RLE Deficits / Details: No active movement noted.  Pain sensation appears intact. LLE Deficits / Details: Generally weak, but does attempt to actively move LE.  Not following directions for mobility or testing.    Cervical / Trunk Assessment: Kyphotic  Communication   Communication: Expressive difficulties;Receptive difficulties  Cognition Arousal/Alertness:  Lethargic Behavior During Therapy: Flat affect Overall Cognitive Status: Difficult to assess Area of Impairment: Attention;Following commands   Current Attention Level: Focused   Following Commands: Follows one step commands inconsistently       General Comments: pt with 5-7 second delay, but does follow some simple directions, but not consistently.      General Comments      Exercises        Assessment/Plan    PT Assessment Patient needs continued PT services  PT Diagnosis Difficulty walking;Hemiplegia non-dominant side;Altered mental status   PT Problem List Decreased strength;Decreased activity tolerance;Decreased balance;Decreased mobility;Decreased coordination;Decreased cognition;Decreased  knowledge of use of DME;Decreased safety awareness;Cardiopulmonary status limiting activity  PT Treatment Interventions DME instruction;Functional mobility training;Therapeutic activities;Therapeutic exercise;Balance training;Neuromuscular re-education;Cognitive remediation;Patient/family education   PT Goals (Current goals can be found in the Care Plan section) Acute Rehab PT Goals Patient Stated Goal: pt not verbalizing PT Goal Formulation: Patient unable to participate in goal setting Time For Goal Achievement: 08/06/15 Potential to Achieve Goals: Fair    Frequency Min 3X/week   Barriers to discharge        Co-evaluation               End of Session   Activity Tolerance: Patient limited by fatigue Patient left: in bed;with call bell/phone within reach;with family/visitor present Nurse Communication: Mobility status         Time: [id number] PT Time Calculation (min) (ACUTE ONLY): 24 min   Charges:   PT Evaluation $PT Eval Moderate Complexity: 1 Procedure PT Treatments $Therapeutic Activity: 8-22 mins   PT G CodesCatarina Hartshorn, [place] [phone] 07/23/2015, 2:01 PM

## 2015-07-23 NOTE — Care Management Important Message (Signed)
Important Message  Patient Details  Name: ADLIH PEDROZA MRN: [id number] Date of Birth: 1924-10-12   Medicare Important Message Given:  Yes    Catheleen Langhorne Abena 07/23/2015, 1:45 PM

## 2015-07-23 NOTE — Progress Notes (Signed)
Pt SBP sustaining above 180 after administering 70 mg of labetalol. Paged Neurology. Orders given to administer 5 mg of hydralazine once now and hold labetalol for now per Dr. Leonel Ramsay.

## 2015-07-23 NOTE — Progress Notes (Signed)
MBSS complete. Full report located under chart review in imaging section. Lawana Hartzell, MA CCC-SLP [phone]  

## 2015-07-23 NOTE — Progress Notes (Signed)
STROKE TEAM PROGRESS NOTE   SUBJECTIVE (INTERVAL HISTORY) Her daughter, granddaughter, and niece are at the bedside. She was extubated yesterday, so far tolerating well. Still has aphasia and right side hemiplegia. But passed swallow and on diet.     OBJECTIVE Temp:  [97.2 F (36.2 C)-98.7 F (37.1 C)] 98.7 F (37.1 C) (03/29 1600) Pulse Rate:  [64-92] 91 (03/29 1600) Cardiac Rhythm:  [-] Atrial fibrillation (03/29 1717) Resp:  [13-22] 17 (03/29 1600) BP: (125-203)/(64-105) 187/89 mmHg (03/29 1857) SpO2:  [96 %-100 %] 96 % (03/29 1600) Weight:  [147 lb 7.8 oz (66.9 kg)] 147 lb 7.8 oz (66.9 kg) (03/29 0500)  CBC:   Recent Labs Lab 07/20/15 0450 07/21/15 0332 07/22/15 0305 07/23/15 0746  WBC 6.8 9.3 8.2 7.7  NEUTROABS 5.4 6.4  --   --   HGB 12.6 13.1 12.9 13.7  HCT 39.3 40.4 40.7 41.8  MCV 93.6 93.7 94.9 96.1  PLT 117* 122* 101* 101*    Basic Metabolic Panel:   Recent Labs Lab 07/21/15 0332 07/22/15 0305 07/23/15 1305  NA 144 144 150*  K 4.0 3.2* 3.8  CL 113* 114* 116*  CO2 20* 23 24  GLUCOSE 121* 130* 128*  BUN 21* 18 18  CREATININE 0.74 0.68 0.64  CALCIUM 8.7* 8.4* 9.2  MG 2.0  --   --   PHOS 3.3  --   --     Lipid Panel:     Component Value Date/Time   CHOL 129 07/20/2015 0350   TRIG 33 07/20/2015 0350   HDL 48 07/20/2015 0350   CHOLHDL 2.7 07/20/2015 0350   VLDL 7 07/20/2015 0350   LDLCALC 74 07/20/2015 0350   HgbA1c:  Lab Results  Component Value Date   HGBA1C 7.1* 07/20/2015   Urine Drug Screen:     Component Value Date/Time   LABOPIA NONE DETECTED 07/19/2015 2233   COCAINSCRNUR NONE DETECTED 07/19/2015 2233   LABBENZ NONE DETECTED 07/19/2015 2233   AMPHETMU NONE DETECTED 07/19/2015 2233   THCU NONE DETECTED 07/19/2015 2233   LABBARB NONE DETECTED 07/19/2015 2233      IMAGING I have personally reviewed the radiological images below and agree with the radiology interpretations.  Ct Angio Head and Neck W/cm &/or Wo Cm 07/19/2015    1. Positive for emergent large vessel occlusion at the left ICA terminus. Thrombus from the level of the left posterior communicating artery extending into both the left M1 and A1 segments. Some early reconstituted left MCA and ACA enhancement.  2. Large area of abnormal left MCA perfusion characteristics, but with relatively preserved cerebral blood volume and delayed collateral enhancement on the perfusion source images.  3. In conjunction with the noncontrast head CT (reported separately) the above constellation is favorable for endovascular reperfusion.  4. No superimposed arterial stenosis in the neck. Tortuous bilateral carotid arteries.  5. Superimposed intracranial atherosclerosis elsewhere including moderate proximal basilar stenosis, mild stenosis of the dominant distal right vertebral artery, and mild to moderate bilateral PCA and right MCA branch irregularity.  6. Bilateral upper lobe pulmonary ground-glass opacity and septal thickening may indicate developing interstitial edema.   Ct Cerebral Perfusion W/cm 07/19/2015   1. Positive for emergent large vessel occlusion at the left ICA terminus. Thrombus from the level of the left posterior communicating artery extending into both the left M1 and A1 segments. Some early reconstituted left MCA and ACA enhancement.  2. Large area of abnormal left MCA perfusion characteristics, but with relatively preserved cerebral blood  volume and delayed collateral enhancement on the perfusion source images.  3. In conjunction with the noncontrast head CT (reported separately) the above constellation is favorable for endovascular reperfusion.  4. No superimposed arterial stenosis in the neck. Tortuous bilateral carotid arteries.  5. Superimposed intracranial atherosclerosis elsewhere including moderate proximal basilar stenosis, mild stenosis of the dominant distal right vertebral artery, and mild to moderate bilateral PCA and right MCA branch irregularity.   6. Bilateral upper lobe pulmonary ground-glass opacity and septal thickening may indicate developing interstitial edema.   Ct Head Wo Contrast 07/20/2015   No acute intracranial process, status post cerebral angiogram with residual intravascular contrast. Involutional changes and mild chronic small vessel ischemic disease.   Ct Head Wo Contrast 07/19/2015   No acute intracranial hemorrhage. Age-related atrophy and chronic microvascular ischemic disease. If symptoms persist and there are no contraindications, MRI may provide better evaluation if clinically indicated.    Dg Chest Port 1 View 07/20/2015   Left lung base and right hilar hazy densities concerning for pneumonia superimposed on background of congestion/edema. Clinical correlation and follow-up recommended. Endotracheal tube above the carina.   Mr Brain Wo Contrast 07/20/2015  IMPRESSION: Acute large LEFT anterior cerebral artery territory infarct. Acute small LEFT middle cerebral artery territory infarct, predominately involving the basal ganglia and LEFT frontal lobe. 3 mm LEFT-to-RIGHT midline shift. Moderate chronic small vessel ischemic disease and old small LEFT cerebellar infarct.   2D echo - - Left ventricle: The cavity size was normal. There was mild  concentric hypertrophy. Systolic function was vigorous. The  estimated ejection fraction was in the range of 65% to 70%. - Aortic valve: Valve area (VTI): 1.51 cm^2. Valve area (Vmax):  1.74 cm^2. Valve area (Vmean): 1.74 cm^2. - Mitral valve: There was mild regurgitation. - Left atrium: The atrium was mildly dilated. - Right atrium: The atrium was mildly dilated. - Atrial septum: No defect or patent foramen ovale was identified.   PHYSICAL EXAM  Temp:  [97.2 F (36.2 C)-98.7 F (37.1 C)] 98.7 F (37.1 C) (03/29 1600) Pulse Rate:  [64-92] 91 (03/29 1600) Resp:  [13-22] 17 (03/29 1600) BP: (125-203)/(64-105) 187/89 mmHg (03/29 1857) SpO2:  [96 %-100 %] 96 % (03/29  1600) Weight:  [147 lb 7.8 oz (66.9 kg)] 147 lb 7.8 oz (66.9 kg) (03/29 0500)  General - Well nourished, well developed, intubated not on sedation.  Ophthalmologic - Fundi not visualized due to incooperation.  Cardiovascular - irregularly irregular heart rate and rhythm.  Neuro - awake alert, but not following commands, global aphasia. PERRL, left eye gaze preference, not blinking to visual threat on the right, with right neglect. Right facial droop and right hemiplegia, 0/5 RUE and trace withdraw on pain at RLE, left UE and LE spontaneous movement against gravity. DTR 1+, no babinski. Sensation, coordination and gait not tested.   ASSESSMENT/PLAN Joy Holden is a 82 y.o. female with history of afib not on AC, HTN, asthma presenting with speech abnormality, right facial droop, and right hemiplegia.  She did  receive IV TPA Saturday, 07/19/2015 at 2215.  Stroke:  Dominant left MCA and ACA territory infarct likely embolic secondary to afib not on AC, s/p tPA and mechanical thrombectomy with TICI2b revasculization.  Resultant  Right hemiplegia and left gaze preference  MRI left MCA and ACA territory infarcts  CTA head and neck - left terminal ICA thrombus, bilateral siphon stenosis, right M1 stenosis   2D Echo  EF 65-70%  LDL 74  HgbA1c  7.1  VTE prophylaxis - lovenox  No antithrombotic prior to admission, now on ASA PR 300mg . Due to moderate to large size of stroke, will initiate AC in 5-7 days post stroke.  Patient counseled to be compliant with her antithrombotic medications  Ongoing aggressive stroke risk factor management  Therapy recommendations:  Pending  Disposition: Pending  Chronic afib  Not on AC PTA  Due to moderate to large size of stroke, will initiate AC in 5-7 days post stroke.  Home cardiazem resumed  Hypertension  Blood pressure elevated at times. Resume PO meds On cardizem and lisinopril now  Hyperlipidemia  Home meds:  No lipid lowering  medications PTA  LDL 74, goal < 70  Put on lipitor 10  Continue statin on discharge  Other Stroke Risk Factors  Advanced age  Atrial fibrillation without anticoagulation  Other Active Problems  Mild thrombocytopenia  Hypokalemia - supplement   Hospital day # 4  This patient is critically ill due to left MCA infarct, left ICA occlusion s/p tPA and mechanical thrombectomy, respiratory failure, and afib and at significant risk of neurological worsening, death form recurrent stroke, hemorrhagic transformation, heart failure and seizure. This patient's care requires constant monitoring of vital signs, hemodynamics, respiratory and cardiac monitoring, review of multiple databases, neurological assessment, discussion with family, other specialists and medical decision making of high complexity. I spent 35 minutes of neurocritical care time in the care of this patient.  Joy Hawking, MD PhD Stroke Neurology 07/23/2015 7:13 PM    To contact Stroke Continuity provider, please refer to [email or web address] After hours, contact General Neurology

## 2015-07-24 LAB — URINALYSIS, ROUTINE W REFLEX MICROSCOPIC
BILIRUBIN URINE: NEGATIVE
GLUCOSE, UA: NEGATIVE mg/dL
KETONES UR: 15 mg/dL — AB
NITRITE: NEGATIVE
PH: 6 (ref 5.0–8.0)
Protein, ur: 30 mg/dL — AB
SPECIFIC GRAVITY, URINE: 1.017 (ref 1.005–1.030)

## 2015-07-24 LAB — URINE MICROSCOPIC-ADD ON

## 2015-07-24 LAB — BASIC METABOLIC PANEL
ANION GAP: 11 (ref 5–15)
BUN: 17 mg/dL (ref 6–20)
CHLORIDE: 112 mmol/L — AB (ref 101–111)
CO2: 24 mmol/L (ref 22–32)
Calcium: 8.7 mg/dL — ABNORMAL LOW (ref 8.9–10.3)
Creatinine, Ser: 0.61 mg/dL (ref 0.44–1.00)
GFR calc Af Amer: >60 mL/min (ref >60)
GFR calc non Af Amer: >60 mL/min (ref >60)
GLUCOSE: 120 mg/dL — AB (ref 65–99)
POTASSIUM: 3.6 mmol/L (ref 3.5–5.1)
Sodium: 147 mmol/L — ABNORMAL HIGH (ref 135–145)

## 2015-07-24 LAB — GLUCOSE, CAPILLARY
GLUCOSE-CAPILLARY: 106 mg/dL — AB (ref 65–99)
GLUCOSE-CAPILLARY: 113 mg/dL — AB (ref 65–99)
GLUCOSE-CAPILLARY: 119 mg/dL — AB (ref 65–99)
Glucose-Capillary: 113 mg/dL — ABNORMAL HIGH (ref 65–99)
Glucose-Capillary: 169 mg/dL — ABNORMAL HIGH (ref 65–99)

## 2015-07-24 LAB — CBC
HEMATOCRIT: 40.4 % (ref 36.0–46.0)
Hemoglobin: 12.8 g/dL (ref 12.0–15.0)
MCH: 29.9 pg (ref 26.0–34.0)
MCHC: 31.7 g/dL (ref 30.0–36.0)
MCV: 94.4 fL (ref 78.0–100.0)
Platelets: 116 10*3/uL — ABNORMAL LOW (ref 150–400)
RBC: 4.28 MIL/uL (ref 3.87–5.11)
RDW: 13.3 % (ref 11.5–15.5)
WBC: 6.8 10*3/uL (ref 4.0–10.5)

## 2015-07-24 MED ORDER — SODIUM CHLORIDE 0.9 % IV SOLN
INTRAVENOUS | Status: DC
  Administered 2015-07-24 – 2015-07-28 (×2): via INTRAVENOUS

## 2015-07-24 MED ORDER — AMLODIPINE BESYLATE 10 MG PO TABS
10.0000 mg | ORAL_TABLET | Freq: Every day | ORAL | Status: DC
  Administered 2015-07-24 – 2015-07-28 (×4): 10 mg via ORAL
  Filled 2015-07-24 (×4): qty 1

## 2015-07-24 MED ORDER — HYDRALAZINE HCL 25 MG PO TABS
25.0000 mg | ORAL_TABLET | Freq: Three times a day (TID) | ORAL | Status: DC
  Administered 2015-07-24 – 2015-07-28 (×10): 25 mg via ORAL
  Filled 2015-07-24 (×11): qty 1

## 2015-07-24 MED ORDER — LISINOPRIL 20 MG PO TABS
20.0000 mg | ORAL_TABLET | Freq: Two times a day (BID) | ORAL | Status: DC
Start: 2015-07-24 — End: 2015-07-28
  Administered 2015-07-24 – 2015-07-28 (×7): 20 mg via ORAL
  Filled 2015-07-24 (×7): qty 1

## 2015-07-24 MED ORDER — CIPROFLOXACIN IN D5W 400 MG/200ML IV SOLN
400.0000 mg | Freq: Two times a day (BID) | INTRAVENOUS | Status: DC
  Administered 2015-07-25: 400 mg via INTRAVENOUS
  Filled 2015-07-24 (×2): qty 200

## 2015-07-24 NOTE — Clinical Social Work Note (Signed)
BSW intern has spoken with patient's granddaughter, Denton Ar to present bed offers. Family would like to visit facilities before making final decision. BSW intern has given patient's family CSW call back number to f/u with disposition.   BSW intern remains available.  [place] intern [phone]

## 2015-07-24 NOTE — Clinical Social Work Note (Signed)
Clinical Social Work Assessment  Patient Details  Name: Joy Holden MRN: [id number] Date of Birth: January 10, 1925  Date of referral:  07/24/15               Reason for consult:  Facility Placement, Discharge Planning                Permission sought to share information with:  Family Supports Permission granted to share information::  Yes, Verbal Permission Granted  Name::      Denton Ar Orthoptist)  Agency::   (SNF)  Relationship::   (granddaughter, nephew)  Sport and exercise psychologist Information:   [phone])  Housing/Transportation Living arrangements for the past 2 months:  Single Family Home Source of Information:  Other (Comment Required) (other family members) Patient Interpreter Needed:  None Criminal Activity/Legal Involvement Pertinent to Current Situation/Hospitalization:  No - Comment as needed Significant Relationships:  Other Family Members Lives with:  Self Do you feel safe going back to the place where you live?  No Need for family participation in patient care:  Yes (Comment)  Care giving concerns:  Patient's family expressed concerns with possible living arrangements post-discharge from SNF.  Considering long-term SNF vs. HH.   Social Worker assessment / plan:   BSW intern has spoken with patient's niece, nephew, and granddaughter at bedside, to discucss discharge disposition. BSW intern made patient's family aware of SNF recommendation given by PT.  BSW intern has presented patient's family with SNF list followed by a thorough explanation of SNF process and insurance coverage. PTA, patient lived at private residence alone. Patient's family informed BSW intern that this process is completely new to them therefore, they are unfamiliar with any of the SNF's within [facility]. Patient's family requested to speak with patient's PT in reference to why patient was not considered for CIR. Patient's family understands that once patient d/c from SNF, she will be unable to return home  independently, per patient's MD. Patient's family is currently making arrangements for 24 hour supervision to be provided. Patient's family has given BSW intern verbal permission to facilitate SNF placement. BSW intern to f/u with patient's family regarding extended bed offers once clinical have been reviewed. Scheduled d/c is unknown at this time. BSW intern remains available.   Employment status:  Retired Forensic scientist:  [employer] PT Recommendations:  [place] / Referral to community resources:  [place]  Patient/Family's Response to care:   Patient's family appreciated Social Work intervention given by [employer]. Patient's family seems to be of very strong support.   Patient/Family's Understanding of and Emotional Response to Diagnosis, Current Treatment, and Prognosis:   Patient's family is agreeable to SNF placement for further rehab.   Emotional Assessment Appearance:  Appears stated age Attitude/Demeanor/Rapport:   (resting comfortably) Affect (typically observed):  Quiet Orientation:  Oriented to Self, Oriented to Place, Oriented to Situation Alcohol / Substance use:  Not Applicable Psych involvement (Current and /or in the community):  No (Comment)  Discharge Needs  Concerns to be addressed:  No discharge needs identified Readmission within the last 30 days:  No Current discharge risk:  None Barriers to Discharge:  No Barriers Identified   Leane Call, Student-SW 07/24/2015, 9:58 AM

## 2015-07-24 NOTE — NC FL2 (Signed)
[place] LEVEL OF CARE SCREENING TOOL     IDENTIFICATION  Patient Name: Joy Holden Birthdate: [birth year] Sex: female Admission Date (Current Location): 07/19/2015  [place] and [place] Number:  Lamount [employer] ADVANTAGE/[employer]- [id number] ([place] [phone]) Facility and Address:  [facility] [place], [place] [address] [place], [place] [address]      Provider Number: [id number]  Attending Physician Name and Address:  Rosalin Hawking, MD  Relative Name and Phone Number:   Sabrina Miyazaki- granddaughter, [phone])    Current Level of Care: Hospital Recommended Level of Care: [place] Prior Approval Number:    Date Approved/Denied:   PASRR Number:    Discharge Plan: SNF    Current Diagnoses: Patient Active Problem List   Diagnosis Date Noted  . Paroxysmal atrial fibrillation (HCC)   . HLD (hyperlipidemia)   . Aphasia   . Right hemiplegia ([place])   . Dysphagia   . CVA (cerebral infarction) 07/20/2015  . Acute ischemic stroke ([place]) 07/19/2015  . Essential hypertension 02/20/2015  . 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    Orientation RESPIRATION BLADDER Height & Weight     Self, Situation, Place  O2 (2L/min) Indwelling catheter Weight: 147 lb 7.8 oz (66.9 kg) Height:  5\' 2"  (157.5 cm)  BEHAVIORAL SYMPTOMS/MOOD NEUROLOGICAL BOWEL NUTRITION STATUS      Incontinent Diet (DYS 2)  AMBULATORY STATUS COMMUNICATION OF NEEDS Skin   Total Care   Normal                       Personal Care Assistance Level of Assistance  Total care       Total Care Assistance: Maximum assistance   Functional Limitations Info  Speech     Speech Info: Impaired    SPECIAL CARE FACTORS FREQUENCY  OT (By licensed OT), Speech therapy, PT (By licensed PT)     PT Frequency:  (3x/week) OT Frequency:  (2x/week)            Contractures      Additional  Factors Info  Code Status, Allergies Code Status Info:  (DNR) Allergies Info:  (Penicillins, Aspirin, Morphine)           Current Medications (07/24/2015):  This is the current hospital active medication list Current Facility-Administered Medications  Medication Dose Route Frequency Provider Last Rate Last Dose  . acetaminophen (TYLENOL) tablet 650 mg  650 mg Oral Q4H PRN Wallie Char      . antiseptic oral rinse (CPC / CETYLPYRIDINIUM CHLORIDE 0.05%) solution 7 mL  7 mL Mouth Rinse BID Rush Farmer, MD   7 mL at 07/23/15 2201  . aspirin tablet 325 mg  325 mg Oral Daily Rosalin Hawking, MD   325 mg at 07/24/15 [id number]  . atorvastatin (LIPITOR) tablet 10 mg  10 mg Oral q1800 Rosalin Hawking, MD      . diltiazem (CARDIZEM CD) 24 hr capsule 180 mg  180 mg Oral Daily Rosalin Hawking, MD   180 mg at 07/24/15 0927  . enoxaparin (LOVENOX) injection 40 mg  40 mg Subcutaneous Q24H Rosalin Hawking, MD   40 mg at 07/24/15 [id number]  . insulin aspart (novoLOG) injection 0-9 Units  0-9 Units Subcutaneous 6 times per day Javier Glazier, MD   1 Units at 07/23/15 2201  . ipratropium-albuterol (DUONEB) 0.5-2.5 (3) MG/3ML nebulizer solution 3 mL  3 mL Nebulization Q6H PRN Rigoberto Noel, MD  3 mL at 07/22/15 1335  . labetalol (NORMODYNE,TRANDATE) injection 5-20 mg  5-20 mg Intravenous Q2H PRN Rosalin Hawking, MD   10 mg at 07/24/15 0659  . lisinopril (PRINIVIL,ZESTRIL) tablet 20 mg  20 mg Oral Daily Rosalin Hawking, MD   20 mg at 07/24/15 [id number]  . pantoprazole (PROTONIX) EC tablet 40 mg  40 mg Oral Daily Rosalin Hawking, MD   40 mg at 07/24/15 [id number]     Discharge Medications: Please see discharge summary for a list of discharge medications.  Relevant Imaging Results:  Relevant Lab Results:   Additional Information  (SSN: [id number])  Leane Call, Student-SW [phone]

## 2015-07-24 NOTE — Clinical Social Work Placement (Signed)
   CLINICAL SOCIAL WORK PLACEMENT  NOTE  Date:  07/24/2015  Patient Details  Name: Joy Holden MRN: [id number] Date of Birth: [birth year]  Clinical Social Work is seeking post-discharge placement for this patient at the [place] level of care (*CSW will initial, date and re-position this form in  chart as items are completed):  Yes   Patient/family provided with [place] Work Department's list of facilities offering this level of care within the geographic area requested by the patient (or if unable, by the patient's family).  Yes   Patient/family informed of their freedom to choose among providers that offer the needed level of care, that participate in Medicare, Medicaid or managed care program needed by the patient, have an available bed and are willing to accept the patient.  Yes   Patient/family informed of 's ownership interest in [facility] and [facility], as well as of the fact that they are under no obligation to receive care at these facilities.  PASRR submitted to EDS on       PASRR number received on       Existing PASRR number confirmed on       FL2 transmitted to all facilities in geographic area requested by pt/family on 07/24/15     FL2 transmitted to all facilities within larger geographic area on       Patient informed that his/her managed care company has contracts with or will negotiate with certain facilities, including the following:            Patient/family informed of bed offers received.  Patient chooses bed at       Physician recommends and patient chooses bed at      Patient to be transferred to   on  .  Patient to be transferred to facility by       Patient family notified on   of transfer.  Name of family member notified:        PHYSICIAN Please sign FL2, Please sign DNR     Additional Comment:    _______________________________________________ Leane Call,  Student-SW 07/24/2015, 9:21 AM

## 2015-07-24 NOTE — Progress Notes (Signed)
STROKE TEAM PROGRESS NOTE   SUBJECTIVE (INTERVAL HISTORY) No family present. Right neglect remains. Responsive when you stand on her L. However, found to have hematuria. Urinalysis sent. No fever or leukocytosis.    OBJECTIVE Temp:  [97.4 F (36.3 C)-99 F (37.2 C)] 97.4 F (36.3 C) (03/30 1006) Pulse Rate:  [75-96] 86 (03/30 1006) Cardiac Rhythm:  [-] Atrial fibrillation (03/29 2000) Resp:  [16-22] 16 (03/30 1006) BP: (158-214)/(83-113) 205/113 mmHg (03/30 1006) SpO2:  [95 %-98 %] 95 % (03/30 1006)  CBC:   Recent Labs Lab 07/20/15 0450 07/21/15 0332  07/23/15 0746 07/24/15 0320  WBC 6.8 9.3  < > 7.7 6.8  NEUTROABS 5.4 6.4  --   --   --   HGB 12.6 13.1  < > 13.7 12.8  HCT 39.3 40.4  < > 41.8 40.4  MCV 93.6 93.7  < > 96.1 94.4  PLT 117* 122*  < > 101* 116*  < > = values in this interval not displayed.  Basic Metabolic Panel:   Recent Labs Lab 07/21/15 0332  07/23/15 1305 07/24/15 0320  NA 144  < > 150* 147*  K 4.0  < > 3.8 3.6  CL 113*  < > 116* 112*  CO2 20*  < > 24 24  GLUCOSE 121*  < > 128* 120*  BUN 21*  < > 18 17  CREATININE 0.74  < > 0.64 0.61  CALCIUM 8.7*  < > 9.2 8.7*  MG 2.0  --   --   --   PHOS 3.3  --   --   --   < > = values in this interval not displayed.  Lipid Panel:     Component Value Date/Time   CHOL 129 07/20/2015 0350   TRIG 33 07/20/2015 0350   HDL 48 07/20/2015 0350   CHOLHDL 2.7 07/20/2015 0350   VLDL 7 07/20/2015 0350   LDLCALC 74 07/20/2015 0350   HgbA1c:  Lab Results  Component Value Date   HGBA1C 7.1* 07/20/2015   Urine Drug Screen:     Component Value Date/Time   LABOPIA NONE DETECTED 07/19/2015 2233   COCAINSCRNUR NONE DETECTED 07/19/2015 2233   LABBENZ NONE DETECTED 07/19/2015 2233   AMPHETMU NONE DETECTED 07/19/2015 2233   THCU NONE DETECTED 07/19/2015 2233   LABBARB NONE DETECTED 07/19/2015 2233      IMAGING I have personally reviewed the radiological images below and agree with the radiology  interpretations.  Ct Angio Head and Neck W/cm &/or Wo Cm 07/19/2015   1. Positive for emergent large vessel occlusion at the left ICA terminus. Thrombus from the level of the left posterior communicating artery extending into both the left M1 and A1 segments. Some early reconstituted left MCA and ACA enhancement.  2. Large area of abnormal left MCA perfusion characteristics, but with relatively preserved cerebral blood volume and delayed collateral enhancement on the perfusion source images.  3. In conjunction with the noncontrast head CT (reported separately) the above constellation is favorable for endovascular reperfusion.  4. No superimposed arterial stenosis in the neck. Tortuous bilateral carotid arteries.  5. Superimposed intracranial atherosclerosis elsewhere including moderate proximal basilar stenosis, mild stenosis of the dominant distal right vertebral artery, and mild to moderate bilateral PCA and right MCA branch irregularity.  6. Bilateral upper lobe pulmonary ground-glass opacity and septal thickening may indicate developing interstitial edema.   Ct Cerebral Perfusion W/cm 07/19/2015   1. Positive for emergent large vessel occlusion at the left ICA terminus.  Thrombus from the level of the left posterior communicating artery extending into both the left M1 and A1 segments. Some early reconstituted left MCA and ACA enhancement.  2. Large area of abnormal left MCA perfusion characteristics, but with relatively preserved cerebral blood volume and delayed collateral enhancement on the perfusion source images.  3. In conjunction with the noncontrast head CT (reported separately) the above constellation is favorable for endovascular reperfusion.  4. No superimposed arterial stenosis in the neck. Tortuous bilateral carotid arteries.  5. Superimposed intracranial atherosclerosis elsewhere including moderate proximal basilar stenosis, mild stenosis of the dominant distal right vertebral artery,  and mild to moderate bilateral PCA and right MCA branch irregularity.  6. Bilateral upper lobe pulmonary ground-glass opacity and septal thickening may indicate developing interstitial edema.   Ct Head Wo Contrast 07/20/2015   No acute intracranial process, status post cerebral angiogram with residual intravascular contrast. Involutional changes and mild chronic small vessel ischemic disease.   Ct Head Wo Contrast 07/19/2015   No acute intracranial hemorrhage. Age-related atrophy and chronic microvascular ischemic disease. If symptoms persist and there are no contraindications, MRI may provide better evaluation if clinically indicated.    Dg Chest Port 1 View 07/20/2015   Left lung base and right hilar hazy densities concerning for pneumonia superimposed on background of congestion/edema. Clinical correlation and follow-up recommended. Endotracheal tube above the carina.   Mr Brain Wo Contrast 07/20/2015  IMPRESSION: Acute large LEFT anterior cerebral artery territory infarct. Acute small LEFT middle cerebral artery territory infarct, predominately involving the basal ganglia and LEFT frontal lobe. 3 mm LEFT-to-RIGHT midline shift. Moderate chronic small vessel ischemic disease and old small LEFT cerebellar infarct.   2D echo  - Left ventricle: The cavity size was normal. There was mild concentric hypertrophy. Systolic function was vigorous. The estimated ejection fraction was in the range of 65% to 70%. - Aortic valve: Valve area (VTI): 1.51 cm^2. Valve area (Vmax): 1.74 cm^2. Valve area (Vmean): 1.74 cm^2. - Mitral valve: There was mild regurgitation. - Left atrium: The atrium was mildly dilated. - Right atrium: The atrium was mildly dilated. - Atrial septum: No defect or patent foramen ovale was identified.   PHYSICAL EXAM  Temp:  [97.4 F (36.3 C)-99 F (37.2 C)] 97.4 F (36.3 C) (03/30 1006) Pulse Rate:  [75-96] 86 (03/30 1006) Resp:  [16-22] 16 (03/30 1006) BP:  (158-214)/(83-113) 205/113 mmHg (03/30 1006) SpO2:  [95 %-98 %] 95 % (03/30 1006)  General - Well nourished, well developed, intubated not on sedation.  Ophthalmologic - Fundi not visualized due to incooperation.  Cardiovascular - irregularly irregular heart rate and rhythm.  Neuro - awake alert, but not following commands, global aphasia. PERRL, left eye gaze preference, not blinking to visual threat on the right, with right neglect. Right facial droop and right hemiplegia, 0/5 RUE and trace withdraw on pain at RLE, left UE and LE spontaneous movement against gravity. DTR 1+, no babinski. Sensation, coordination and gait not tested.   ASSESSMENT/PLAN Ms. Joy Holden is a 82 y.o. female with history of afib not on AC, HTN, asthma presenting with speech abnormality, right facial droop, and right hemiplegia.  She did  receive IV TPA Saturday, 07/19/2015 at 2215.  Stroke:  Dominant left MCA and ACA territory infarct likely embolic secondary to afib not on AC, s/p tPA and mechanical thrombectomy with TICI2b revasculization.  Resultant  Right hemiplegia and left gaze preference  MRI left MCA and ACA territory infarcts  CTA head and  neck - left terminal ICA thrombus, bilateral siphon stenosis, right M1 stenosis   2D Echo  EF 65-70%  LDL 74  HgbA1c 7.1  VTE prophylaxis - lovenox  No antithrombotic prior to admission, now on ASA PR 300mg . Due to moderate to large size of stroke, will initiate AC in 5-7 days post stroke.  Patient counseled to be compliant with her antithrombotic medications  Ongoing aggressive stroke risk factor management  Therapy recommendations:  SNF  Disposition: pending. Medically ready for discharge once bed available/disposition determined with family.   Respiratory Failure, resolved  Intubated for neuro intervention  Extubated easily   Chronic afib  Not on AC PTA  Due to moderate to large size of stroke, will initiate AC in 5-7 days post  stroke.  Home cardiazem resumed  Hematuria   UA sent  Negative fever or leukocytosis  On foley  Hypertension  Blood pressure elevated at times.  Resumed home PO meds  On cardizem and lisinopril now  Hyperlipidemia  Home meds:  No lipid lowering medications PTA  LDL 74, goal < 70  Put on lipitor 10  Continue statin on discharge  Other Stroke Risk Factors  Advanced age  Atrial fibrillation without anticoagulation  Hematuria  No known trauma, disease  Check UA & Culture  On aspirin  Other Active Problems  Mild thrombocytopenia  Hypokalemia - supplement  Hospital day # 5  Rosalin Hawking, MD PhD Stroke Neurology 07/24/2015 11:19 PM     To contact Stroke Continuity provider, please refer to [email or web address] After hours, contact General Neurology

## 2015-07-24 NOTE — Progress Notes (Signed)
Speech Language Pathology Treatment: Dysphagia  Patient Details Name: Joy Holden MRN: [id number] DOB: Aug 31, 1924 Today's Date: 07/24/2015 Time: [id number] SLP Time Calculation (min) (ACUTE ONLY): 10 min  Assessment / Plan / Recommendation Clinical Impression  F/u after yesterday's MBS -pt sleeping upon entering room.  Washed her face with max assist to initiate and carry-out; max cues to drink water from cup.  Pt generally tolerated limited sips from cup, excluding one episode of mild cough upon initial swallow.  Followed simple, contextual commands with mod verbal/tactile cues; stated "yes" and "no" appropriately when asked questions about personal needs.  No other verbalizations elicited; pt quite drowsy.  Will continue to follow for aphasia and swallowing.    HPI HPI: Ms. RADHA LAGER is a 82 y.o. female with history of afib not on AC, HTN, asthma presenting with speech abnormality, right facial droop, and right hemiplegia. Pt recieved IV tPA on 3/25. MRI shows left MCA and ACA territory infarcts. Intubated from 3/25 to 3/28.       SLP Plan  Continue with current plan of care     Recommendations  Diet recommendations: Dysphagia 2 (fine chop);Thin liquid Medication Administration: Crushed with puree Compensations: Slow rate;Small sips/bites;Minimize environmental distractions Postural Changes and/or Swallow Maneuvers: Seated upright 90 degrees             Oral Care Recommendations: Oral care BID Plan: Continue with current plan of care     GO                [name] 07/24/2015, 4:12 PM

## 2015-07-24 NOTE — Progress Notes (Signed)
Occupational Therapy Treatment Patient Details Name: Joy Holden MRN: [id number] DOB: 11-Feb-1925 Today's Date: 07/24/2015    History of present illness This 82 y.o. female admitted with abnormal speech, Rt facial droop, and Rt hemiplegia.  MRI showed Lt MCA and ACA territory infarcts.  She received tPA and mechanical thrombectomy with revasculization.  PMH includes:  A-Fib, HTN, Asthma    OT comments  Worked with pt on self feeding.  She was more alert, but is very, very slow to initiate and perform activity.  She requires max overall for self feeding, intake minimal.   At this time recommend SNF, as she is not demonstrating ability to be able to participate in at least 3 hours of therapy/day.  No family present during OT session   Follow Up Recommendations  SNF;Supervision/Assistance - 24 hour    Equipment Recommendations  None recommended by OT    Recommendations for Other Services      Precautions / Restrictions Precautions Precautions: Fall Restrictions Weight Bearing Restrictions: No       Mobility Bed Mobility                  Transfers                      Balance                                   ADL Overall ADL's : Needs assistance/impaired Eating/Feeding: Maximal assistance;Bed level Eating/Feeding Details (indicate cue type and reason): Worked with pt on sefl feeding.  She requires max A overall.  She requires hand over hand assist to load food on spoon.  She initialy required max A to move spoon to mouth, consistently undershoots, she progressed to min A for this for ~1/4 of time until she fatigued.  Pt with limited intake                                          Vision                     Perception     Praxis      Cognition   Behavior During Therapy: Flat affect Overall Cognitive Status: Impaired/Different from baseline Area of Impairment: Attention;Following commands;Problem solving    Current Attention Level: Sustained;Focused (brief periods of sustained attention for 30 seconds )    Following Commands: Follows one step commands inconsistently;Follows one step commands with increased time     Problem Solving: Slow processing;Decreased initiation;Difficulty sequencing;Requires verbal cues;Requires tactile cues General Comments: Pt with delayed processing.  Moves very slowly and is very slow to initiate activity.  only follows commands in context of function ~35% of time     Extremity/Trunk Assessment               Exercises     Shoulder Instructions       General Comments      Pertinent Vitals/ Pain       Pain Assessment: Faces Faces Pain Scale: No hurt  Home Living                                          Prior Functioning/Environment  Frequency Min 2X/week     Progress Toward Goals  OT Goals(current goals can now be found in the care plan section)  Progress towards OT goals: Progressing toward goals (feeding goal added )  Acute Rehab OT Goals Patient Stated Goal: pt not verbalizing OT Goal Formulation: Patient unable to participate in goal setting Time For Goal Achievement: 08/05/15 Potential to Achieve Goals: Fair ADL Goals Pt Will Perform Eating: with mod assist;bed level;sitting Pt Will Perform Grooming: with mod assist;sitting Pt Will Transfer to Toilet: with mod assist;squat pivot transfer;bedside commode Pt/caregiver will Perform Home Exercise Program:  (Family will be independent with PROM Lt UE) Additional ADL Goal #1: Pt will follow one step commands during ADL tasks with min cues/gestures  Additional ADL Goal #2: Pt will sit EOB x 5 mins with min A   Plan Discharge plan remains appropriate    Co-evaluation                 End of Session     Activity Tolerance Patient limited by lethargy   Patient Left in bed;with bed alarm set   Nurse Communication Mobility status         Time: [id number] OT Time Calculation (min): 34 min  Charges: OT General Charges $OT Visit: 1 Procedure OT Treatments $Self Care/Home Management : 23-37 mins  Lavonne Cass M 07/24/2015, 1:14 PM

## 2015-07-25 DIAGNOSIS — Z7901 Long term (current) use of anticoagulants: Secondary | ICD-10-CM

## 2015-07-25 LAB — BASIC METABOLIC PANEL
Anion gap: 8 (ref 5–15)
BUN: 9 mg/dL (ref 6–20)
CHLORIDE: 107 mmol/L (ref 101–111)
CO2: 26 mmol/L (ref 22–32)
CREATININE: 0.57 mg/dL (ref 0.44–1.00)
Calcium: 8.2 mg/dL — ABNORMAL LOW (ref 8.9–10.3)
GFR calc Af Amer: >60 mL/min (ref >60)
GFR calc non Af Amer: >60 mL/min (ref >60)
Glucose, Bld: 131 mg/dL — ABNORMAL HIGH (ref 65–99)
Potassium: 2.8 mmol/L — ABNORMAL LOW (ref 3.5–5.1)
Sodium: 141 mmol/L (ref 135–145)

## 2015-07-25 LAB — GLUCOSE, CAPILLARY
GLUCOSE-CAPILLARY: 153 mg/dL — AB (ref 65–99)
GLUCOSE-CAPILLARY: 109 mg/dL — AB (ref 65–99)
Glucose-Capillary: 115 mg/dL — ABNORMAL HIGH (ref 65–99)
Glucose-Capillary: 174 mg/dL — ABNORMAL HIGH (ref 65–99)

## 2015-07-25 LAB — CBC
HEMATOCRIT: 41.8 % (ref 36.0–46.0)
HEMOGLOBIN: 14 g/dL (ref 12.0–15.0)
MCH: 31.3 pg (ref 26.0–34.0)
MCHC: 33.5 g/dL (ref 30.0–36.0)
MCV: 93.3 fL (ref 78.0–100.0)
Platelets: 106 10*3/uL — ABNORMAL LOW (ref 150–400)
RBC: 4.48 MIL/uL (ref 3.87–5.11)
RDW: 12.8 % (ref 11.5–15.5)
WBC: 6.2 10*3/uL (ref 4.0–10.5)

## 2015-07-25 MED ORDER — CIPROFLOXACIN IN D5W 400 MG/200ML IV SOLN: 400.0000 mg | INTRAVENOUS | Status: DC

## 2015-07-25 MED ORDER — CIPROFLOXACIN HCL 250 MG PO TABS
250.0000 mg | ORAL_TABLET | Freq: Two times a day (BID) | ORAL | Status: DC
  Administered 2015-07-25 – 2015-07-28 (×6): 250 mg via ORAL
  Filled 2015-07-25 (×7): qty 1

## 2015-07-25 MED ORDER — CIPROFLOXACIN HCL 250 MG PO TABS: 250.0000 mg | ORAL_TABLET | Freq: Two times a day (BID) | ORAL | Status: DC

## 2015-07-25 MED ORDER — CIPROFLOXACIN IN D5W 400 MG/200ML IV SOLN: 400.0000 mg | Freq: Two times a day (BID) | INTRAVENOUS | Status: DC

## 2015-07-25 MED ORDER — APIXABAN 5 MG PO TABS
5.0000 mg | ORAL_TABLET | Freq: Two times a day (BID) | ORAL | Status: DC
  Administered 2015-07-26 – 2015-07-28 (×5): 5 mg via ORAL
  Filled 2015-07-25 (×5): qty 1

## 2015-07-25 MED ORDER — POTASSIUM CHLORIDE 20 MEQ PO PACK
20.0000 meq | PACK | Freq: Two times a day (BID) | ORAL | Status: DC
  Administered 2015-07-26: 20 meq via ORAL
  Filled 2015-07-25 (×3): qty 1

## 2015-07-25 NOTE — Progress Notes (Signed)
Physical Therapy Treatment Patient Details Name: Joy Holden MRN: [id number] DOB: Nov 11, 1924 Today's Date: 07/25/2015    History of Present Illness Pt is an 82 y.o. female admitted with abnormal speech, Rt facial droop, and Rt hemiplegia.  MRI showed Lt MCA and ACA territory infarcts.  She received tPA and mechanical thrombectomy with revasculization.  PMH includes:  A-Fib, HTN, Asthma     PT Comments    Pt assisted to sitting EOB and working on trunk control with upright posture.  Pt with SpO2 97% room air and HR 96 bpm mid-session.  Continue to recommend SNF upon d/c.  Follow Up Recommendations  SNF     Equipment Recommendations  None recommended by PT    Recommendations for Other Services       Precautions / Restrictions Precautions Precautions: Fall    Mobility  Bed Mobility Overal bed mobility: Needs Assistance;+2 for physical assistance Bed Mobility: Supine to Sit;Sit to Supine     Supine to sit: Total assist;+2 for physical assistance;HOB elevated Sit to supine: Total assist;+2 for physical assistance   General bed mobility comments: pt not attempting to assist with mobility  Transfers                    Ambulation/Gait                 Stairs            Wheelchair Mobility    Modified Rankin (Stroke Patients Only)       Balance Overall balance assessment: Needs assistance Sitting-balance support: Bilateral upper extremity supported;Feet supported Sitting balance-Leahy Scale: Poor Sitting balance - Comments: pt holding L rail and propped R UE on pillow for better support however still requires at least min assist external support, occasionally following multiple cues for improved posture and neck extension to look upward (maintains increased trunk flexion, rounded shoulders and neck flexion at rest), pt briefly able to attend to looking right                            Cognition Arousal/Alertness: Lethargic Behavior  During Therapy: Flat affect Overall Cognitive Status: Difficult to assess Area of Impairment: Attention;Following commands   Current Attention Level: Focused   Following Commands: Follows one step commands inconsistently     Problem Solving: Slow processing;Decreased initiation;Difficulty sequencing;Requires verbal cues;Requires tactile cues General Comments: delayed processing but able to follow some simple directions however not consistently.  pt occasionally answering yes/no questions however not consistent, awake and alert once EOB however quickly back to sleep upon return to supine    Exercises      General Comments        Pertinent Vitals/Pain Pain Assessment: No/denies pain (shakes head no to pain)    Home Living                      Prior Function            PT Goals (current goals can now be found in the care plan section) Progress towards PT goals: Progressing toward goals (very slowly)    Frequency  Min 3X/week    PT Plan Current plan remains appropriate    Co-evaluation             End of Session   Activity Tolerance: Patient limited by fatigue Patient left: in bed;with call bell/phone within reach;with bed alarm set     Time:  [id number] PT Time Calculation (min) (ACUTE ONLY): 15 min  Charges:  $Therapeutic Activity: 8-22 mins                    G Codes:      Mariangela Heldt,KATHrine E 08-04-2015, 10:28 AM Carmelia Bake, PT, DPT August 04, 2015 Pager: [phone]

## 2015-07-25 NOTE — Care Management Note (Signed)
Case Management Note  Patient Details  Name: Joy Holden MRN: [id number] Date of Birth: 1925/02/19  Subjective/Objective:                    Action/Plan: Plan is for SNF at discharge. CM continuing to follow for d/c needs.   Expected Discharge Date:                  Expected Discharge Plan:  Skilled Nursing Facility  In-House Referral:  Clinical Social Work  Discharge planning Services  CM Consult  Post Acute Care Choice:    Choice offered to:     DME Arranged:    DME Agency:     HH Arranged:    [place] Agency:     Status of Service:  In process, will continue to follow  Medicare Important Message Given:  Yes Date Medicare IM Given:    Medicare IM give by:    Date Additional Medicare IM Given:    Additional Medicare Important Message give by:     If discussed at [place] of Stay Meetings, dates discussed:    Additional Comments:  Pollie Friar, RN 07/25/2015, 3:21 PM

## 2015-07-25 NOTE — Progress Notes (Signed)
Occupational Therapy Treatment Patient Details Name: Joy Holden MRN: [id number] DOB: December 20, 1924 Today's Date: 07/25/2015    History of present illness Pt is an 82 y.o. female admitted with abnormal speech, Rt facial droop, and Rt hemiplegia.  MRI showed Lt MCA and ACA territory infarcts.  She received tPA and mechanical thrombectomy with revasculization.  PMH includes:  A-Fib, HTN, Asthma    OT comments  Pt moved to EOB with total A, and sat briefly with max A.  She does not attempt to right posture.  She was more alert today and semi interactive.  IV RN in to establish new IV, therefore, pt returned to supine.   Follow Up Recommendations  SNF;Supervision/Assistance - 24 hour    Equipment Recommendations  None recommended by OT    Recommendations for Other Services      Precautions / Restrictions Precautions Precautions: Fall       Mobility Bed Mobility Overal bed mobility: Needs Assistance Bed Mobility: Supine to Sit     Supine to sit: Total assist Sit to supine: Total assist   General bed mobility comments: Pt able to assist minimally with moving Lt LE to EOB, and did push up with Lt UE minimally   Transfers                 General transfer comment: unable to attempt due to IV RN in to see pt     Balance Overall balance assessment: Needs assistance Sitting-balance support: Bilateral upper extremity supported Sitting balance-Leahy Scale: Poor Sitting balance - Comments: Pt sat EOB briefly with max A.  Flexed trunk.  Does no attemt to right posture.  IV nurse arrived to establishe new IV.  Pt was assisted to supine with total A                            ADL Overall ADL's : Needs assistance/impaired                                              Vision                     Perception     Praxis      Cognition   Behavior During Therapy: Flat affect Overall Cognitive Status: Impaired/Different from  baseline Area of Impairment: Attention;Problem solving   Current Attention Level: Focused    Following Commands: Follows one step commands inconsistently     Problem Solving: Slow processing;Decreased initiation;Difficulty sequencing;Requires verbal cues;Requires tactile cues General Comments: delayed processing but able to follow some simple directions however not consistently.  pt occasionally answering yes/no questions however not consistent, awake and alert once EOB however quickly back to sleep upon return to supine    Extremity/Trunk Assessment               Exercises     Shoulder Instructions       General Comments      Pertinent Vitals/ Pain       Pain Assessment: Faces Faces Pain Scale: No hurt  Home Living                                          Prior Functioning/Environment  Frequency Min 2X/week     Progress Toward Goals  OT Goals(current goals can now be found in the care plan section)  Progress towards OT goals: Progressing toward goals  ADL Goals Pt Will Perform Eating: with mod assist;bed level;sitting Pt Will Perform Grooming: with mod assist;sitting Pt Will Transfer to Toilet: with mod assist;squat pivot transfer;bedside commode Additional ADL Goal #1: Pt will follow one step commands during ADL tasks with min cues/gestures  Additional ADL Goal #2: Pt will sit EOB x 5 mins with min A   Plan Discharge plan remains appropriate    Co-evaluation                 End of Session     Activity Tolerance Other (comment) (Other provider to see pt )   Patient Left in bed;with call bell/phone within reach;with family/visitor present;with nursing/sitter in room   Nurse Communication Mobility status        Time: [id number] OT Time Calculation (min): 12 min  Charges: OT General Charges $OT Visit: 1 Procedure OT Treatments $Neuromuscular Re-education: 8-22 mins  [name] M 07/25/2015, 12:02  PM

## 2015-07-25 NOTE — Progress Notes (Signed)
SLP Cancellation Note  Patient Details Name: Joy Holden MRN: [id number] DOB: Dec 02, 1924   Cancelled treatment:       Reason Eval/Treat Not Completed: Fatigue/lethargy limiting ability to participate   [name] 07/25/2015, 2:14 PM

## 2015-07-25 NOTE — Progress Notes (Signed)
Patient morning po medications held due to the patient being NPO. Nurse made judgement to not give the patient anything by mouth due to the patient not being alert.

## 2015-07-25 NOTE — Progress Notes (Signed)
STROKE TEAM PROGRESS NOTE   SUBJECTIVE (INTERVAL HISTORY) Two family members present today. There was concern regarding her ability to swallow. A bedside swallowing evaluation was performed by the RN and the patient was able to swallow applesauce with some prompting. Hematuria much improved, UA showed UTI and on cipro. Pending SNF placement.   OBJECTIVE Temp:  [97.7 F (36.5 C)-99.1 F (37.3 C)] 97.9 F (36.6 C) (03/31 1356) Pulse Rate:  [80-91] 80 (03/31 1356) Cardiac Rhythm:  [-] Atrial fibrillation (03/31 0700) Resp:  [16-28] 20 (03/31 1356) BP: (129-166)/(74-87) 159/77 mmHg (03/31 1356) SpO2:  [94 %-97 %] 94 % (03/31 1356)  CBC:   Recent Labs Lab 07/20/15 0450 07/21/15 0332  07/24/15 0320 07/25/15 0548  WBC 6.8 9.3  < > 6.8 6.2  NEUTROABS 5.4 6.4  --   --   --   HGB 12.6 13.1  < > 12.8 14.0  HCT 39.3 40.4  < > 40.4 41.8  MCV 93.6 93.7  < > 94.4 93.3  PLT 117* 122*  < > 116* 106*  < > = values in this interval not displayed.  Basic Metabolic Panel:   Recent Labs Lab 07/21/15 0332  07/24/15 0320 07/25/15 0548  NA 144  < > 147* 141  K 4.0  < > 3.6 2.8*  CL 113*  < > 112* 107  CO2 20*  < > 24 26  GLUCOSE 121*  < > 120* 131*  BUN 21*  < > 17 9  CREATININE 0.74  < > 0.61 0.57  CALCIUM 8.7*  < > 8.7* 8.2*  MG 2.0  --   --   --   PHOS 3.3  --   --   --   < > = values in this interval not displayed.  Lipid Panel:     Component Value Date/Time   CHOL 129 07/20/2015 0350   TRIG 33 07/20/2015 0350   HDL 48 07/20/2015 0350   CHOLHDL 2.7 07/20/2015 0350   VLDL 7 07/20/2015 0350   LDLCALC 74 07/20/2015 0350   HgbA1c:  Lab Results  Component Value Date   HGBA1C 7.1* 07/20/2015   Urine Drug Screen:     Component Value Date/Time   LABOPIA NONE DETECTED 07/19/2015 2233   COCAINSCRNUR NONE DETECTED 07/19/2015 2233   LABBENZ NONE DETECTED 07/19/2015 2233   AMPHETMU NONE DETECTED 07/19/2015 2233   THCU NONE DETECTED 07/19/2015 2233   LABBARB NONE DETECTED  07/19/2015 2233      IMAGING I have personally reviewed the radiological images below and agree with the radiology interpretations.  Ct Angio Head and Neck W/cm &/or Wo Cm 07/19/2015   1. Positive for emergent large vessel occlusion at the left ICA terminus. Thrombus from the level of the left posterior communicating artery extending into both the left M1 and A1 segments. Some early reconstituted left MCA and ACA enhancement.  2. Large area of abnormal left MCA perfusion characteristics, but with relatively preserved cerebral blood volume and delayed collateral enhancement on the perfusion source images.  3. In conjunction with the noncontrast head CT (reported separately) the above constellation is favorable for endovascular reperfusion.  4. No superimposed arterial stenosis in the neck. Tortuous bilateral carotid arteries.  5. Superimposed intracranial atherosclerosis elsewhere including moderate proximal basilar stenosis, mild stenosis of the dominant distal right vertebral artery, and mild to moderate bilateral PCA and right MCA branch irregularity.  6. Bilateral upper lobe pulmonary ground-glass opacity and septal thickening may indicate developing interstitial edema.  Ct Cerebral Perfusion W/cm 07/19/2015   1. Positive for emergent large vessel occlusion at the left ICA terminus. Thrombus from the level of the left posterior communicating artery extending into both the left M1 and A1 segments. Some early reconstituted left MCA and ACA enhancement.  2. Large area of abnormal left MCA perfusion characteristics, but with relatively preserved cerebral blood volume and delayed collateral enhancement on the perfusion source images.  3. In conjunction with the noncontrast head CT (reported separately) the above constellation is favorable for endovascular reperfusion.  4. No superimposed arterial stenosis in the neck. Tortuous bilateral carotid arteries.  5. Superimposed intracranial  atherosclerosis elsewhere including moderate proximal basilar stenosis, mild stenosis of the dominant distal right vertebral artery, and mild to moderate bilateral PCA and right MCA branch irregularity.  6. Bilateral upper lobe pulmonary ground-glass opacity and septal thickening may indicate developing interstitial edema.   Ct Head Wo Contrast 07/20/2015   No acute intracranial process, status post cerebral angiogram with residual intravascular contrast. Involutional changes and mild chronic small vessel ischemic disease.   Ct Head Wo Contrast 07/19/2015   No acute intracranial hemorrhage. Age-related atrophy and chronic microvascular ischemic disease. If symptoms persist and there are no contraindications, MRI may provide better evaluation if clinically indicated.    Dg Chest Port 1 View 07/20/2015   Left lung base and right hilar hazy densities concerning for pneumonia superimposed on background of congestion/edema. Clinical correlation and follow-up recommended. Endotracheal tube above the carina.   Mr Brain Wo Contrast 07/20/2015  IMPRESSION: Acute large LEFT anterior cerebral artery territory infarct. Acute small LEFT middle cerebral artery territory infarct, predominately involving the basal ganglia and LEFT frontal lobe. 3 mm LEFT-to-RIGHT midline shift. Moderate chronic small vessel ischemic disease and old small LEFT cerebellar infarct.   2D echo  - Left ventricle: The cavity size was normal. There was mild concentric hypertrophy. Systolic function was vigorous. The estimated ejection fraction was in the range of 65% to 70%. - Aortic valve: Valve area (VTI): 1.51 cm^2. Valve area (Vmax): 1.74 cm^2. Valve area (Vmean): 1.74 cm^2. - Mitral valve: There was mild regurgitation. - Left atrium: The atrium was mildly dilated. - Right atrium: The atrium was mildly dilated. - Atrial septum: No defect or patent foramen ovale was identified.  Modified Barium Swallow with speech  therapy 07/23/2015 Delayed swallow initiation noted Mild aspiration risk Dysphagia 2 diet with thin liquids recommended   PHYSICAL EXAM  Temp:  [97.7 F (36.5 C)-99.1 F (37.3 C)] 97.9 F (36.6 C) (03/31 1356) Pulse Rate:  [80-91] 80 (03/31 1356) Resp:  [16-28] 20 (03/31 1356) BP: (129-166)/(74-87) 159/77 mmHg (03/31 1356) SpO2:  [94 %-97 %] 94 % (03/31 1356)  General - Well nourished, well developed, intubated not on sedation.  Ophthalmologic - Fundi not visualized due to incooperation.  Cardiovascular - irregularly irregular heart rate and rhythm.  Neuro - awake alert, but not following commands, global aphasia. PERRL, left eye gaze preference, not blinking to visual threat on the right, with right neglect. Right facial droop and right hemiplegia, 0/5 RUE and trace withdraw on pain at RLE, left UE and LE spontaneous movement against gravity. DTR 1+, no babinski. Sensation, coordination and gait not tested.   ASSESSMENT/PLAN Ms. Joy Holden is a 82 y.o. female with history of afib not on AC, HTN, asthma presenting with speech abnormality, right facial droop, and right hemiplegia.  She did  receive IV TPA 07/19/2015 at 2215.  Stroke:  Dominant left MCA and  ACA territory infarct likely embolic secondary to afib not on [facility], s/p tPA and mechanical thrombectomy with TICI2b revasculization.  Resultant  Right hemiplegia and left gaze preference  MRI left MCA and ACA territory infarcts  CTA head and neck - left terminal ICA thrombus, bilateral siphon stenosis, right M1 stenosis   2D Echo  EF 65-70%  LDL 74  HgbA1c 7.1  VTE prophylaxis - lovenox  No antithrombotic prior to admission, now on ASA 325mg . Will start eliquis 5mg  bid tonight at 5-7 days post stroke due to moderate to large size of infarct.   Patient counseled to be compliant with her antithrombotic medications  Ongoing aggressive stroke risk factor management  Therapy recommendations:  SNF  Disposition:  pending. Medically ready for discharge once bed available/disposition determined with family.   Chronic afib  Not on AC PTA  Will start eliquis 5mg  bid tonight at 5-7 days post stroke due to moderate to large size of infarct.   Home cardiazem resumed  Hematuria   UA sent  Negative fever or leukocytosis  On foley - no known trauma  Urine culture 07/24/2015 preliminary report - greater than 100,000 colonies of Escherichia coli. Sensitivities pending.  Currently on Cipro - change to PO - await sensitivities.  Hypertension  Blood pressure elevated at times.  Resumed home PO meds  Increase lisinopril to 20mg  bid and add hydralazine. Continue amlodipine.   Hyperlipidemia  Home meds:  No lipid lowering medications PTA  LDL 74, goal < 70  Put on lipitor 10  Continue statin on discharge  Other Stroke Risk Factors  Advanced age  Atrial fibrillation without anticoagulation  Dysphagia  Modified barium swallow as above 07/23/15  Mild aspiration risk  Dysphagia 2 diet with thin liquids    Other Active Problems  Mild thrombocytopenia  Hypokalemia - K 2.8 - supplement  Hospital day # 6  Rosalin Hawking, MD PhD Stroke Neurology 07/25/2015 9:55 PM    To contact Stroke Continuity provider, please refer to [email or web address] After hours, contact General Neurology

## 2015-07-26 LAB — BASIC METABOLIC PANEL
ANION GAP: 8 (ref 5–15)
BUN: 10 mg/dL (ref 6–20)
CALCIUM: 8.2 mg/dL — AB (ref 8.9–10.3)
CHLORIDE: 106 mmol/L (ref 101–111)
CO2: 28 mmol/L (ref 22–32)
CREATININE: 0.57 mg/dL (ref 0.44–1.00)
GFR calc Af Amer: >60 mL/min (ref >60)
GLUCOSE: 112 mg/dL — AB (ref 65–99)
POTASSIUM: 2.5 mmol/L — AB (ref 3.5–5.1)
Sodium: 142 mmol/L (ref 135–145)

## 2015-07-26 LAB — CBC
HEMATOCRIT: 41.4 % (ref 36.0–46.0)
HEMOGLOBIN: 14.1 g/dL (ref 12.0–15.0)
MCH: 31.6 pg (ref 26.0–34.0)
MCHC: 34.1 g/dL (ref 30.0–36.0)
MCV: 92.8 fL (ref 78.0–100.0)
Platelets: 117 10*3/uL — ABNORMAL LOW (ref 150–400)
RBC: 4.46 MIL/uL (ref 3.87–5.11)
RDW: 12.6 % (ref 11.5–15.5)
WBC: 6 10*3/uL (ref 4.0–10.5)

## 2015-07-26 LAB — URINE CULTURE: Culture: >=100000

## 2015-07-26 LAB — GLUCOSE, CAPILLARY
GLUCOSE-CAPILLARY: 116 mg/dL — AB (ref 65–99)
Glucose-Capillary: 127 mg/dL — ABNORMAL HIGH (ref 65–99)
Glucose-Capillary: 142 mg/dL — ABNORMAL HIGH (ref 65–99)
Glucose-Capillary: 137 mg/dL — ABNORMAL HIGH (ref 65–99)

## 2015-07-26 MED ORDER — POTASSIUM CHLORIDE 10 MEQ/100ML IV SOLN
INTRAVENOUS | Status: AC
Start: 2015-07-26 — End: 2015-07-26
  Administered 2015-07-26: 10 meq via INTRAVENOUS
  Filled 2015-07-26: qty 100

## 2015-07-26 MED ORDER — POTASSIUM CHLORIDE 20 MEQ PO PACK
20.0000 meq | PACK | Freq: Three times a day (TID) | ORAL | Status: DC
  Administered 2015-07-26 – 2015-07-27 (×5): 20 meq via ORAL
  Filled 2015-07-26 (×8): qty 1

## 2015-07-26 MED ORDER — POTASSIUM CHLORIDE 10 MEQ/100ML IV SOLN
10.0000 meq | INTRAVENOUS | Status: AC
  Administered 2015-07-26 (×4): 10 meq via INTRAVENOUS
  Filled 2015-07-26 (×3): qty 100

## 2015-07-26 NOTE — Progress Notes (Signed)
Pts appetite appears to have increased for lunch and dinner today.  Family present at both meals.   Per family, patient prefers vegetables over meats and likes Sprite.

## 2015-07-26 NOTE — Progress Notes (Signed)
Joy Holden from in-house lab called and informed RN of patients potassium level of 2.5. Patient did not receive last 2 potassium doses due to lethargy and patients capability to safely swallow. RN will continue to monitor patient and inquire about alternative medication route.

## 2015-07-26 NOTE — Progress Notes (Signed)
STROKE TEAM PROGRESS NOTE   SUBJECTIVE (INTERVAL HISTORY) No family members present today. The patient is slightly less lethargic than yesterday. She is still not at baseline. Discussed situation with patients RN. Nursing reports adequate PO intake today. The patient was able to tolerate PO meds without too much difficulty. We'll hold off on NG tube placement for now.   OBJECTIVE Temp:  [97.5 F (36.4 C)-98 F (36.7 C)] 98 F (36.7 C) (04/01 1010) Pulse Rate:  [78-91] 91 (04/01 1010) Cardiac Rhythm:  [-] Atrial fibrillation (04/01 0700) Resp:  [16-20] 16 (04/01 1010) BP: (134-179)/(77-90) 179/86 mmHg (04/01 1010) SpO2:  [93 %-98 %] 97 % (04/01 1010)  CBC:   Recent Labs Lab 07/20/15 0450 07/21/15 0332  07/25/15 0548 07/26/15 0407  WBC 6.8 9.3  < > 6.2 6.0  NEUTROABS 5.4 6.4  --   --   --   HGB 12.6 13.1  < > 14.0 14.1  HCT 39.3 40.4  < > 41.8 41.4  MCV 93.6 93.7  < > 93.3 92.8  PLT 117* 122*  < > 106* 117*  < > = values in this interval not displayed.  Basic Metabolic Panel:   Recent Labs Lab 07/21/15 0332  07/25/15 0548 07/26/15 0407  NA 144  < > 141 142  K 4.0  < > 2.8* 2.5*  CL 113*  < > 107 106  CO2 20*  < > 26 28  GLUCOSE 121*  < > 131* 112*  BUN 21*  < > 9 10  CREATININE 0.74  < > 0.57 0.57  CALCIUM 8.7*  < > 8.2* 8.2*  MG 2.0  --   --   --   PHOS 3.3  --   --   --   < > = values in this interval not displayed.  Lipid Panel:     Component Value Date/Time   CHOL 129 07/20/2015 0350   TRIG 33 07/20/2015 0350   HDL 48 07/20/2015 0350   CHOLHDL 2.7 07/20/2015 0350   VLDL 7 07/20/2015 0350   LDLCALC 74 07/20/2015 0350   HgbA1c:  Lab Results  Component Value Date   HGBA1C 7.1* 07/20/2015   Urine Drug Screen:     Component Value Date/Time   LABOPIA NONE DETECTED 07/19/2015 2233   COCAINSCRNUR NONE DETECTED 07/19/2015 2233   LABBENZ NONE DETECTED 07/19/2015 2233   AMPHETMU NONE DETECTED 07/19/2015 2233   THCU NONE DETECTED 07/19/2015 2233    LABBARB NONE DETECTED 07/19/2015 2233      IMAGING I have personally reviewed the radiological images below and agree with the radiology interpretations.  Ct Angio Head and Neck W/cm &/or Wo Cm 07/19/2015   1. Positive for emergent large vessel occlusion at the left ICA terminus. Thrombus from the level of the left posterior communicating artery extending into both the left M1 and A1 segments. Some early reconstituted left MCA and ACA enhancement.  2. Large area of abnormal left MCA perfusion characteristics, but with relatively preserved cerebral blood volume and delayed collateral enhancement on the perfusion source images.  3. In conjunction with the noncontrast head CT (reported separately) the above constellation is favorable for endovascular reperfusion.  4. No superimposed arterial stenosis in the neck. Tortuous bilateral carotid arteries.  5. Superimposed intracranial atherosclerosis elsewhere including moderate proximal basilar stenosis, mild stenosis of the dominant distal right vertebral artery, and mild to moderate bilateral PCA and right MCA branch irregularity.  6. Bilateral upper lobe pulmonary ground-glass opacity and septal thickening  may indicate developing interstitial edema.    Ct Cerebral Perfusion W/cm 07/19/2015   1. Positive for emergent large vessel occlusion at the left ICA terminus. Thrombus from the level of the left posterior communicating artery extending into both the left M1 and A1 segments. Some early reconstituted left MCA and ACA enhancement.  2. Large area of abnormal left MCA perfusion characteristics, but with relatively preserved cerebral blood volume and delayed collateral enhancement on the perfusion source images.  3. In conjunction with the noncontrast head CT (reported separately) the above constellation is favorable for endovascular reperfusion.  4. No superimposed arterial stenosis in the neck. Tortuous bilateral carotid arteries.  5. Superimposed  intracranial atherosclerosis elsewhere including moderate proximal basilar stenosis, mild stenosis of the dominant distal right vertebral artery, and mild to moderate bilateral PCA and right MCA branch irregularity.  6. Bilateral upper lobe pulmonary ground-glass opacity and septal thickening may indicate developing interstitial edema.    Ct Head Wo Contrast 07/20/2015   No acute intracranial process, status post cerebral angiogram with residual intravascular contrast. Involutional changes and mild chronic small vessel ischemic disease.    Ct Head Wo Contrast 07/19/2015   No acute intracranial hemorrhage. Age-related atrophy and chronic microvascular ischemic disease. If symptoms persist and there are no contraindications, MRI may provide better evaluation if clinically indicated.     Dg Chest Port 1 View 07/20/2015   Left lung base and right hilar hazy densities concerning for pneumonia superimposed on background of congestion/edema. Clinical correlation and follow-up recommended. Endotracheal tube above the carina.    Mr Brain Wo Contrast 07/20/2015  IMPRESSION: Acute large LEFT anterior cerebral artery territory infarct. Acute small LEFT middle cerebral artery territory infarct, predominately involving the basal ganglia and LEFT frontal lobe. 3 mm LEFT-to-RIGHT midline shift. Moderate chronic small vessel ischemic disease and old small LEFT cerebellar infarct.    2D echo  - Left ventricle: The cavity size was normal. There was mild concentric hypertrophy. Systolic function was vigorous. The estimated ejection fraction was in the range of 65% to 70%. - Aortic valve: Valve area (VTI): 1.51 cm^2. Valve area (Vmax): 1.74 cm^2. Valve area (Vmean): 1.74 cm^2. - Mitral valve: There was mild regurgitation. - Left atrium: The atrium was mildly dilated. - Right atrium: The atrium was mildly dilated. - Atrial septum: No defect or patent foramen ovale was identified.   Modified Barium Swallow  with speech therapy 07/23/2015 Delayed swallow initiation noted Mild aspiration risk Dysphagia 2 diet with thin liquids recommended   PHYSICAL EXAM  Temp:  [97.5 F (36.4 C)-98 F (36.7 C)] 98 F (36.7 C) (04/01 1010) Pulse Rate:  [78-91] 91 (04/01 1010) Resp:  [16-20] 16 (04/01 1010) BP: (134-179)/(77-90) 179/86 mmHg (04/01 1010) SpO2:  [93 %-98 %] 97 % (04/01 1010)  General - Well nourished, well developed, intubated not on sedation.  Ophthalmologic - Fundi not visualized due to incooperation.  Cardiovascular - irregularly irregular heart rate and rhythm.  Neuro - awake alert, but not following commands, global aphasia. PERRL, left eye gaze preference, not blinking to visual threat on the right, with right neglect. Right facial droop and right hemiplegia, 0/5 RUE and trace withdraw on pain at RLE, left UE and LE spontaneous movement against gravity. DTR 1+, no babinski. Sensation, coordination and gait not tested.   ASSESSMENT/PLAN Ms. Joy Holden is a 82 y.o. female with history of afib not on AC, HTN, asthma presenting with speech abnormality, right facial droop, and right hemiplegia.  She did  receive IV TPA 07/19/2015 at 2215.  Stroke:  Dominant left MCA and ACA territory infarct likely embolic secondary to afib not on AC, s/p tPA and mechanical thrombectomy with TICI2b revasculization.  Resultant  Right hemiplegia and left gaze preference  MRI left MCA and ACA territory infarcts  CTA head and neck - left terminal ICA thrombus, bilateral siphon stenosis, right M1 stenosis   2D Echo  EF 65-70%  LDL 74  HgbA1c 7.1  VTE prophylaxis - lovenox  No antithrombotic prior to admission, now on ASA 325mg . Eliquis 5mg  bid started Friday (07/25/2015 ) at 5-7 days post stroke due to moderate to large size of infarct.   Patient counseled to be compliant with her antithrombotic medications  Ongoing aggressive stroke risk factor management  Therapy recommendations:   SNF  Disposition: pending. Medically ready for discharge once bed available/disposition determined with family.   Chronic afib  Not on AC PTA  Now back on Eliquis  Home cardiazem resumed for rate control.  Hematuria   Hematuria clearing  UA sent  Negative fever or leukocytosis  On foley - no known trauma  Urine culture 07/24/2015 - greater than 100,000 colonies of Escherichia coli. (Sensitive to Cipro)  Change Cipro from IV to PO.  Hypertension  Blood pressure elevated at times.  Resumed home PO meds  Increase lisinopril to 20mg  bid and add hydralazine. Continue amlodipine and diltiazem. (2 calcium channel blockers)  Hyperlipidemia  Home meds:  No lipid lowering medications PTA  LDL 74, goal < 70  Now on Lipitor 10 mg daily  Continue statin on discharge  Other Stroke Risk Factors  Advanced age  Atrial fibrillation without anticoagulation  Dysphagia  Modified barium swallow as above 07/23/15  Mild aspiration risk  Dysphagia 2 diet with thin liquids   Monitor PO intake   Other Active Problems  Mild thrombocytopenia  Hypokalemia - K 2.5 - continue to supplement - check magnesium level  Labs in a.m.  Hospital day # [place] PA-C [facility] Pager [phone] 07/26/2015, 2:08 PM  I have personally examined this patient, reviewed notes, independently viewed imaging studies, participated in medical decision making and plan of care. I have made any additions or clarifications directly to the above note. Agree with note above.   Antony Contras, MD Medical Director [place] Pager: [phone] 07/26/2015 7:04 PM   To contact Stroke Continuity provider, please refer to [email or web address] After hours, contact General Neurology

## 2015-07-26 NOTE — Progress Notes (Signed)
Spoke with on-call MD about patients potassium level, lethargy waxing and waning, and medication administration. Dr Leonel Ramsay is entering potassium dose via IV. RN will continue to monitor patient status.

## 2015-07-26 NOTE — Clinical Social Work Note (Signed)
Clinical Social Worker continuing to follow patient and family for support and discharge planning needs.  CSW attempted to contact patient granddaughter twice to solidify SNF bed choice, however no answer and voicemail box is full.  CSW will continue to reach out to determine patient family bed choice for placement at discharge.  CSW remains available for support and to facilitate patient discharge needs once medically stable.  Barbette Or, LCSW (Weekend Coverage [phone]) [phone]

## 2015-07-26 NOTE — Progress Notes (Signed)
Patient experiencing intermittant lethargy,patient responds to voice but unable to follow commands and keep eyes open. [place] status continue to wax and wane. Patient repositioned with pillow wedge, right side. RN will continue to monitor patient closely.

## 2015-07-27 DIAGNOSIS — R4701 Aphasia: Secondary | ICD-10-CM

## 2015-07-27 LAB — BASIC METABOLIC PANEL
Anion gap: 9 (ref 5–15)
BUN: 13 mg/dL (ref 6–20)
CHLORIDE: 109 mmol/L (ref 101–111)
CO2: 24 mmol/L (ref 22–32)
CREATININE: 0.62 mg/dL (ref 0.44–1.00)
Calcium: 8.2 mg/dL — ABNORMAL LOW (ref 8.9–10.3)
GFR calc Af Amer: >60 mL/min (ref >60)
GFR calc non Af Amer: >60 mL/min (ref >60)
Glucose, Bld: 132 mg/dL — ABNORMAL HIGH (ref 65–99)
Potassium: 3.3 mmol/L — ABNORMAL LOW (ref 3.5–5.1)
Sodium: 142 mmol/L (ref 135–145)

## 2015-07-27 LAB — CBC
HEMATOCRIT: 41.9 % (ref 36.0–46.0)
HEMOGLOBIN: 13.4 g/dL (ref 12.0–15.0)
MCH: 29.9 pg (ref 26.0–34.0)
MCHC: 32 g/dL (ref 30.0–36.0)
MCV: 93.5 fL (ref 78.0–100.0)
Platelets: 133 10*3/uL — ABNORMAL LOW (ref 150–400)
RBC: 4.48 MIL/uL (ref 3.87–5.11)
RDW: 12.8 % (ref 11.5–15.5)
WBC: 7 10*3/uL (ref 4.0–10.5)

## 2015-07-27 LAB — GLUCOSE, CAPILLARY
GLUCOSE-CAPILLARY: 104 mg/dL — AB (ref 65–99)
GLUCOSE-CAPILLARY: 123 mg/dL — AB (ref 65–99)
GLUCOSE-CAPILLARY: 162 mg/dL — AB (ref 65–99)
GLUCOSE-CAPILLARY: 204 mg/dL — AB (ref 65–99)
Glucose-Capillary: 180 mg/dL — ABNORMAL HIGH (ref 65–99)

## 2015-07-27 LAB — MAGNESIUM: Magnesium: 1.7 mg/dL (ref 1.7–2.4)

## 2015-07-27 NOTE — Progress Notes (Signed)
Spoke with granddaughter, Boykin Nearing, who stated that the family has chosen [facility] for Pt.  Bernita Raisin, [place] Social Work [phone]

## 2015-07-27 NOTE — Progress Notes (Signed)
STROKE TEAM PROGRESS NOTE   SUBJECTIVE (INTERVAL HISTORY) No family members present today. She is sitting in chair, can't tell me her name, appears drowsy but alert. Does not talk, just shakes head when i ask her how she is doing.  OBJECTIVE Temp:  [98 F (36.7 C)-99.4 F (37.4 C)] 99 F (37.2 C) (04/02 0548) Pulse Rate:  [79-91] 85 (04/02 0548) Cardiac Rhythm:  [-] Atrial fibrillation (04/01 1900) Resp:  [16-18] 18 (04/02 0548) BP: (140-179)/(65-86) 149/72 mmHg (04/02 0548) SpO2:  [94 %-98 %] 95 % (04/02 0548)  CBC:   Recent Labs Lab 07/21/15 0332  07/26/15 0407 07/27/15 0437  WBC 9.3  < > 6.0 7.0  NEUTROABS 6.4  --   --   --   HGB 13.1  < > 14.1 13.4  HCT 40.4  < > 41.4 41.9  MCV 93.7  < > 92.8 93.5  PLT 122*  < > 117* 133*  < > = values in this interval not displayed.  Basic Metabolic Panel:   Recent Labs Lab 07/21/15 0332  07/26/15 0407 07/27/15 0437  NA 144  < > 142 142  K 4.0  < > 2.5* 3.3*  CL 113*  < > 106 109  CO2 20*  < > 28 24  GLUCOSE 121*  < > 112* 132*  BUN 21*  < > 10 13  CREATININE 0.74  < > 0.57 0.62  CALCIUM 8.7*  < > 8.2* 8.2*  MG 2.0  --   --  1.7  PHOS 3.3  --   --   --   < > = values in this interval not displayed.  Lipid Panel:     Component Value Date/Time   CHOL 129 07/20/2015 0350   TRIG 33 07/20/2015 0350   HDL 48 07/20/2015 0350   CHOLHDL 2.7 07/20/2015 0350   VLDL 7 07/20/2015 0350   LDLCALC 74 07/20/2015 0350   HgbA1c:  Lab Results  Component Value Date   HGBA1C 7.1* 07/20/2015   Urine Drug Screen:     Component Value Date/Time   LABOPIA NONE DETECTED 07/19/2015 2233   COCAINSCRNUR NONE DETECTED 07/19/2015 2233   LABBENZ NONE DETECTED 07/19/2015 2233   AMPHETMU NONE DETECTED 07/19/2015 2233   THCU NONE DETECTED 07/19/2015 2233   LABBARB NONE DETECTED 07/19/2015 2233      IMAGING I have personally reviewed the radiological images below and agree with the radiology interpretations.  Ct Angio Head and Neck W/cm  &/or Wo Cm 07/19/2015   1. Positive for emergent large vessel occlusion at the left ICA terminus. Thrombus from the level of the left posterior communicating artery extending into both the left M1 and A1 segments. Some early reconstituted left MCA and ACA enhancement.  2. Large area of abnormal left MCA perfusion characteristics, but with relatively preserved cerebral blood volume and delayed collateral enhancement on the perfusion source images.  3. In conjunction with the noncontrast head CT (reported separately) the above constellation is favorable for endovascular reperfusion.  4. No superimposed arterial stenosis in the neck. Tortuous bilateral carotid arteries.  5. Superimposed intracranial atherosclerosis elsewhere including moderate proximal basilar stenosis, mild stenosis of the dominant distal right vertebral artery, and mild to moderate bilateral PCA and right MCA branch irregularity.  6. Bilateral upper lobe pulmonary ground-glass opacity and septal thickening may indicate developing interstitial edema.    Ct Cerebral Perfusion W/cm 07/19/2015   1. Positive for emergent large vessel occlusion at the left ICA terminus. Thrombus from  the level of the left posterior communicating artery extending into both the left M1 and A1 segments. Some early reconstituted left MCA and ACA enhancement.  2. Large area of abnormal left MCA perfusion characteristics, but with relatively preserved cerebral blood volume and delayed collateral enhancement on the perfusion source images.  3. In conjunction with the noncontrast head CT (reported separately) the above constellation is favorable for endovascular reperfusion.  4. No superimposed arterial stenosis in the neck. Tortuous bilateral carotid arteries.  5. Superimposed intracranial atherosclerosis elsewhere including moderate proximal basilar stenosis, mild stenosis of the dominant distal right vertebral artery, and mild to moderate bilateral PCA and right  MCA branch irregularity.  6. Bilateral upper lobe pulmonary ground-glass opacity and septal thickening may indicate developing interstitial edema.    Ct Head Wo Contrast 07/20/2015   No acute intracranial process, status post cerebral angiogram with residual intravascular contrast. Involutional changes and mild chronic small vessel ischemic disease.    Ct Head Wo Contrast 07/19/2015   No acute intracranial hemorrhage. Age-related atrophy and chronic microvascular ischemic disease. If symptoms persist and there are no contraindications, MRI may provide better evaluation if clinically indicated.     Dg Chest Port 1 View 07/20/2015   Left lung base and right hilar hazy densities concerning for pneumonia superimposed on background of congestion/edema. Clinical correlation and follow-up recommended. Endotracheal tube above the carina.    Mr Brain Wo Contrast 07/20/2015  IMPRESSION: Acute large LEFT anterior cerebral artery territory infarct. Acute small LEFT middle cerebral artery territory infarct, predominately involving the basal ganglia and LEFT frontal lobe. 3 mm LEFT-to-RIGHT midline shift. Moderate chronic small vessel ischemic disease and old small LEFT cerebellar infarct.    2D echo  - Left ventricle: The cavity size was normal. There was mild concentric hypertrophy. Systolic function was vigorous. The estimated ejection fraction was in the range of 65% to 70%. - Aortic valve: Valve area (VTI): 1.51 cm^2. Valve area (Vmax): 1.74 cm^2. Valve area (Vmean): 1.74 cm^2. - Mitral valve: There was mild regurgitation. - Left atrium: The atrium was mildly dilated. - Right atrium: The atrium was mildly dilated. - Atrial septum: No defect or patent foramen ovale was identified.   Modified Barium Swallow with speech therapy 07/23/2015 Delayed swallow initiation noted Mild aspiration risk Dysphagia 2 diet with thin liquids recommended   PHYSICAL EXAM  Temp:  [98 F (36.7 C)-99.4 F  (37.4 C)] 99 F (37.2 C) (04/02 0548) Pulse Rate:  [79-91] 85 (04/02 0548) Resp:  [16-18] 18 (04/02 0548) BP: (140-179)/(65-86) 149/72 mmHg (04/02 0548) SpO2:  [94 %-98 %] 95 % (04/02 0548)  General - Well nourished, well developed, intubated not on sedation.  Ophthalmologic - Fundi not visualized due to incooperation.  Cardiovascular - irregularly irregular heart rate and rhythm.  Neuro - awake alert, but not following commands, global aphasia. PERRL, left eye gaze preference, not blinking to visual threat on the right, with right neglect. Right facial droop and right hemiplegia, 0/5 RUE and trace withdraw on pain at RLE, left UE and LE spontaneous movement against gravity. DTR 1+, no babinski. Sensation, coordination and gait not tested.   ASSESSMENT/PLAN Ms. Joy Holden is a 82 y.o. female with history of afib not on AC, HTN, asthma presenting with speech abnormality, right facial droop, and right hemiplegia.  She did  receive IV TPA 07/19/2015 at 2215.  Stroke:  Dominant left MCA and ACA territory infarct likely embolic secondary to afib not on [facility], s/p tPA and mechanical  thrombectomy with [id number] revasculization.  Resultant  Right hemiplegia and left gaze preference  MRI left MCA and ACA territory infarcts  CTA head and neck - left terminal ICA thrombus, bilateral siphon stenosis, right M1 stenosis   2D Echo  EF 65-70%  LDL 74  HgbA1c 7.1  VTE prophylaxis - lovenox  No antithrombotic prior to admission, now on ASA 325mg . Eliquis 5mg  bid started Friday (07/25/2015 ) at 5-7 days post stroke due to moderate to large size of infarct.   Patient counseled to be compliant with her antithrombotic medications  Ongoing aggressive stroke risk factor management  Therapy recommendations:  SNF  Disposition: pending. Medically ready for discharge once bed available/disposition determined with family.   Chronic afib  Not on AC PTA  Now back on Eliquis  Home cardiazem resumed  for rate control.  Hematuria   Hematuria clearing  UA sent  Negative fever or leukocytosis  On foley - no known trauma  Urine culture 07/24/2015 - greater than 100,000 colonies of Escherichia coli. (Sensitive to Cipro)  Change Cipro from IV to PO.  Hypertension  Blood pressure elevated at times.  Resumed home PO meds  Increase lisinopril to 20mg  bid and add hydralazine. Continue amlodipine and diltiazem. (2 calcium channel blockers)  Hyperlipidemia  Home meds:  No lipid lowering medications PTA  LDL 74, goal < 70  Now on Lipitor 10 mg daily  Continue statin on discharge  Other Stroke Risk Factors  Advanced age  Atrial fibrillation without anticoagulation  Dysphagia  Modified barium swallow as above 07/23/15  Mild aspiration risk  Dysphagia 2 diet with thin liquids   Monitor PO intake   Other Active Problems  Mild thrombocytopenia  Hypokalemia - K 3.3 - continue to supplement - check magnesium level  Labs in a.m.  Hospital day # [place] PA-C [facility] Pager [phone] 07/27/2015, 8:20 AM  I have personally examined this patient, reviewed notes, independently viewed imaging studies, participated in medical decision making and plan of care. I have made any additions or clarifications directly to the above note. Agree with note above.   Sarina Ill, MD Stroke Neurology [phone] [facility]     To contact Stroke Continuity provider, please refer to [email or web address] After hours, contact General Neurology

## 2015-07-28 DIAGNOSIS — N390 Urinary tract infection, site not specified: Secondary | ICD-10-CM | POA: Diagnosis not present

## 2015-07-28 DIAGNOSIS — J95821 Acute postprocedural respiratory failure: Secondary | ICD-10-CM | POA: Diagnosis not present

## 2015-07-28 DIAGNOSIS — H53461 Homonymous bilateral field defects, right side: Secondary | ICD-10-CM | POA: Diagnosis present

## 2015-07-28 DIAGNOSIS — E876 Hypokalemia: Secondary | ICD-10-CM | POA: Diagnosis not present

## 2015-07-28 DIAGNOSIS — D696 Thrombocytopenia, unspecified: Secondary | ICD-10-CM | POA: Diagnosis present

## 2015-07-28 DIAGNOSIS — R319 Hematuria, unspecified: Secondary | ICD-10-CM | POA: Diagnosis not present

## 2015-07-28 LAB — CBC
HEMATOCRIT: 42.1 % (ref 36.0–46.0)
HEMOGLOBIN: 14 g/dL (ref 12.0–15.0)
MCH: 31.5 pg (ref 26.0–34.0)
MCHC: 33.3 g/dL (ref 30.0–36.0)
MCV: 94.6 fL (ref 78.0–100.0)
PLATELETS: 145 10*3/uL — AB (ref 150–400)
RBC: 4.45 MIL/uL (ref 3.87–5.11)
RDW: 13 % (ref 11.5–15.5)
WBC: 7.7 10*3/uL (ref 4.0–10.5)

## 2015-07-28 LAB — BASIC METABOLIC PANEL
ANION GAP: 8 (ref 5–15)
BUN: 16 mg/dL (ref 4–21)
BUN: 16 mg/dL (ref 6–20)
CHLORIDE: 111 mmol/L (ref 101–111)
CO2: 24 mmol/L (ref 22–32)
Calcium: 8.6 mg/dL — ABNORMAL LOW (ref 8.9–10.3)
Creatinine, Ser: 0.62 mg/dL (ref 0.44–1.00)
Creatinine: 0.6 mg/dL (ref 0.5–1.1)
GFR calc Af Amer: >60 mL/min (ref >60)
GFR calc non Af Amer: >60 mL/min (ref >60)
GLUCOSE: 144 mg/dL — AB (ref 65–99)
Glucose: 144 mg/dL
Potassium: 3.9 mmol/L (ref 3.5–5.1)
Sodium: 143 mmol/L (ref 135–145)
Sodium: 143 mmol/L (ref 137–147)

## 2015-07-28 LAB — GLUCOSE, CAPILLARY
GLUCOSE-CAPILLARY: 134 mg/dL — AB (ref 65–99)
GLUCOSE-CAPILLARY: 115 mg/dL — AB (ref 65–99)
Glucose-Capillary: 137 mg/dL — ABNORMAL HIGH (ref 65–99)
Glucose-Capillary: 143 mg/dL — ABNORMAL HIGH (ref 65–99)
Glucose-Capillary: 217 mg/dL — ABNORMAL HIGH (ref 65–99)

## 2015-07-28 LAB — CBC AND DIFFERENTIAL: WBC: 7.7 10^3/mL

## 2015-07-28 MED ORDER — HYDRALAZINE HCL 25 MG PO TABS: 25.0000 mg | ORAL_TABLET | Freq: Three times a day (TID) | ORAL | Status: DC

## 2015-07-28 MED ORDER — LISINOPRIL 20 MG PO TABS: 20.0000 mg | ORAL_TABLET | Freq: Two times a day (BID) | ORAL | Status: DC

## 2015-07-28 MED ORDER — POTASSIUM CHLORIDE 20 MEQ/15ML (10%) PO SOLN: 20.0000 meq | Freq: Two times a day (BID) | ORAL | Status: DC

## 2015-07-28 MED ORDER — CIPROFLOXACIN HCL 250 MG PO TABS: 250.0000 mg | ORAL_TABLET | Freq: Two times a day (BID) | ORAL | Status: AC

## 2015-07-28 MED ORDER — POTASSIUM CHLORIDE CRYS ER 10 MEQ PO TBCR: 20.0000 meq | EXTENDED_RELEASE_TABLET | Freq: Three times a day (TID) | ORAL | Status: DC

## 2015-07-28 MED ORDER — AMLODIPINE BESYLATE 10 MG PO TABS: 10.0000 mg | ORAL_TABLET | Freq: Every day | ORAL | Status: DC

## 2015-07-28 MED ORDER — DILTIAZEM HCL 30 MG PO TABS
60.0000 mg | ORAL_TABLET | Freq: Three times a day (TID) | ORAL | Status: DC
  Administered 2015-07-28: 60 mg via ORAL
  Filled 2015-07-28: qty 2

## 2015-07-28 MED ORDER — POTASSIUM CHLORIDE 20 MEQ/15ML (10%) PO SOLN
20.0000 meq | Freq: Three times a day (TID) | ORAL | Status: DC
  Administered 2015-07-28 (×2): 20 meq via ORAL
  Filled 2015-07-28 (×2): qty 15

## 2015-07-28 MED ORDER — APIXABAN 5 MG PO TABS: 5.0000 mg | ORAL_TABLET | Freq: Two times a day (BID) | ORAL | Status: AC

## 2015-07-28 MED ORDER — DILTIAZEM HCL 60 MG PO TABS: 60.0000 mg | ORAL_TABLET | Freq: Three times a day (TID) | ORAL | Status: DC

## 2015-07-28 MED ORDER — PANTOPRAZOLE SODIUM 40 MG PO PACK
40.0000 mg | PACK | Freq: Every day | ORAL | Status: DC
  Administered 2015-07-28: 40 mg
  Filled 2015-07-28: qty 20

## 2015-07-28 MED ORDER — ATORVASTATIN CALCIUM 10 MG PO TABS: 10.0000 mg | ORAL_TABLET | Freq: Every day | ORAL | Status: DC

## 2015-07-28 NOTE — Progress Notes (Signed)
Speech Language Pathology Treatment: Dysphagia  Patient Details Name: HERLINDA MARMON MRN: [id number] DOB: 03-26-1925 Today's Date: 07/28/2015 Time: [id number] SLP Time Calculation (min) (ACUTE ONLY): 9 min  Assessment / Plan / Recommendation Clinical Impression  Skilled treatment session focused on addressing dysphagia goals.  SLP facilitated session with Max assist multimodal cues for initiation and portion control while self-feeding thin liquids via straw which resulted in no overt s/s of aspiration. SLP also facilitated session with trials of puree, Dys.2 textures and Dys.3 textures.  Advanced, Dys. 3 texture trials resulted in a prolonged oral phase as well as oral residue that required puree or liquid boluses to clear.  When advanced textures were cleared with thin liquids it resulted in cough 2/2 trials likely due to difficulty with mixed consistencies. Given results of trials as well as lethargy recommend continuing current diet orders.  SLP to continue with trials of advanced textures.     HPI HPI: Ms. EMMARY HECKLE is a 82 y.o. female with history of afib not on AC, HTN, asthma presenting with speech abnormality, right facial droop, and right hemiplegia. Pt recieved IV tPA on 3/25. MRI shows left MCA and ACA territory infarcts. Intubated from 3/25 to 3/28.       SLP Plan  Continue with current plan of care     Recommendations  Diet recommendations: Dysphagia 2 (fine chop);Thin liquid Liquids provided via: Cup;Straw Medication Administration: Crushed with puree Supervision: Patient able to self feed;Staff to assist with self feeding;Full supervision/cueing for compensatory strategies Compensations: Minimize environmental distractions;Slow rate;Small sips/bites;Lingual sweep for clearance of pocketing Postural Changes and/or Swallow Maneuvers: Seated upright 90 degrees             Oral Care Recommendations: Oral care BID Follow up Recommendations: 24 hour  supervision/assistance;Skilled Nursing facility Plan: Continue with current plan of care     GO              Carmelia Roller., CCC-SLP [id number]   [place] 07/28/2015, 4:50 PM

## 2015-07-28 NOTE — Progress Notes (Signed)
D/C orders received, pt for D/C to SNF.  IV and telemetry D/C.  Rx and D/C instructions given to family with verbalized understanding.  Family at bedside to assist with D/C.  EMS brought pt downstairs via stretcher.

## 2015-07-28 NOTE — Discharge Instructions (Signed)

## 2015-07-28 NOTE — Progress Notes (Signed)
Physical Therapy Treatment Patient Details Name: Joy Holden MRN: [id number] DOB: Mar 06, 1925 Today's Date: 07/28/2015    History of Present Illness Pt is an 82 y.o. female admitted with abnormal speech, Rt facial droop, and Rt hemiplegia.  MRI showed Lt MCA and ACA territory infarcts.  She received tPA and mechanical thrombectomy with revasculization.  PMH includes:  A-Fib, HTN, Asthma     PT Comments    Patient more alert and verbalizing at times (tends to perseverate). Sitting EOB improved and attempted standing with no incr tone or contraction noted in RLE with weight bearing.   Follow Up Recommendations  SNF     Equipment Recommendations  None recommended by PT    Recommendations for Other Services       Precautions / Restrictions Precautions Precautions: Fall Restrictions Weight Bearing Restrictions: No    Mobility  Bed Mobility Overal bed mobility: Needs Assistance Bed Mobility: Supine to Sit;Sit to Supine     Supine to sit: Total assist;+2 for physical assistance;HOB elevated Sit to supine: Total assist;+2 for physical assistance   General bed mobility comments: Exit to her Rt; she moved LLE ~4 inches toward EOB; used LUE in proper way to help scoot to EOB once sitting  Transfers Overall transfer level: Needs assistance Equipment used: 2 person hand held assist (bed pad under pelvis) Transfers: Sit to/from Stand Sit to Stand: Total assist;+2 physical assistance         General transfer comment: attempted x 2 with no assistance via LLE on 1 st attempt and ? min effort via LLE on second attempt; no attempt to right her trunk or lift head  Ambulation/Gait                 Stairs            Wheelchair Mobility    Modified Rankin (Stroke Patients Only) Modified Rankin (Stroke Patients Only) Pre-Morbid Rankin Score: Slight disability Modified Rankin: Severe disability     Balance Overall balance assessment: Needs  assistance Sitting-balance support: Single extremity supported;Feet supported Sitting balance-Leahy Scale: Poor Sitting balance - Comments: Sat EOB total 15 minutes with mostly mod assist; up to 2 seconds with min guard assist with then losing her balance to her Rt; pt able to follow instructions for leaning Lt/rt as scooting out to EOB and some use of LUE to assist Postural control: Right lateral lean                          Cognition Arousal/Alertness: Awake/alert Behavior During Therapy: Flat affect Overall Cognitive Status: Difficult to assess     Current Attention Level: Sustained   Following Commands: Follows one step commands inconsistently     Problem Solving: Slow processing;Decreased initiation;Difficulty sequencing;Requires verbal cues;Requires tactile cues      Exercises      General Comments General comments (skin integrity, edema, etc.): Verbalizing "whoa, whoa, whoa..." "wait a minute" and able to count to 3 with PT encouragement      Pertinent Vitals/Pain Pain Assessment: Faces Faces Pain Scale: No hurt    Home Living                      Prior Function            PT Goals (current goals can now be found in the care plan section) Acute Rehab PT Goals Time For Goal Achievement: 08/06/15 Progress towards PT goals: Progressing toward goals  Frequency  Min 3X/week    PT Plan Current plan remains appropriate    Co-evaluation             End of Session Equipment Utilized During Treatment: Gait belt Activity Tolerance: Patient limited by fatigue Patient left: in bed;with call bell/phone within reach;with bed alarm set;with SCD's reapplied     Time: 1136-1200 PT Time Calculation (min) (ACUTE ONLY): 24 min  Charges:  $Neuromuscular Re-education: 23-37 mins                    G Codes:      Hersh Minney 11-Aug-2015, 12:14 PM Pager [phone]

## 2015-07-28 NOTE — Discharge Summary (Signed)
Stroke Discharge Summary  Patient ID: Joy Holden   MRN: [id number]      DOB: 1924/12/09  Date of Admission: 07/19/2015 Date of Discharge: 07/28/2015  Attending Physician:  Garvin Fila, MD, Stroke MD Consulting Physician(s):      Jamie Brookes, M.D. (critical care) Patient's PCP:  Antony Blackbird, MD  DISCHARGE DIAGNOSIS:  Principal Problem:   Acute ischemic stroke ([place]) - Left MCA/ACA s/p IV TPA, IA TPA and mechanical thrombectomy Active Problems:   Essential hypertension   Paroxysmal atrial fibrillation (HCC)   HLD (hyperlipidemia)   Aphasia   Right hemiplegia (HCC)   Dysphagia   Right homonymous hemianopsia   UTI (urinary tract infection)   Hematuria   Respiratory failure, post-operative ([place])   Hypokalemia   Thrombocytopenia ([place])   Past Medical History  Diagnosis Date  . Hypertension   . Cancer ([place])   . Asthma    Past Surgical History  Procedure Laterality Date  . Appendectomy    . Abdominal hysterectomy      PARTIAL  . Cervical spine surgery    . Myomectomy    . Breast surgery    . Ectopic pregnancy surgery    . Neck surgery      c-spine  . Radiology with anesthesia N/A 07/19/2015    Procedure: RADIOLOGY WITH ANESTHESIA;  Surgeon: Luanne Bras, MD;  Location: [facility];  Service: Radiology;  Laterality: N/A;      Medication List    STOP taking these medications        ACACIA GUM PO     Cranberry 140-100-3 MG-MG-UNIT Caps     diltiazem 180 MG 24 hr capsule  Commonly known as:  CARDIZEM CD     GARLIC PO     L-Arginine 500 MG Caps     OVER THE COUNTER MEDICATION     OVER THE COUNTER MEDICATION     OVER THE COUNTER MEDICATION     OVER THE COUNTER MEDICATION     OVER THE COUNTER MEDICATION     OVER THE COUNTER MEDICATION     OVER THE COUNTER MEDICATION     OVER THE COUNTER MEDICATION     OVER THE COUNTER MEDICATION     OVER THE COUNTER MEDICATION     VANADYL SULFATE PO     Vitamin E-Selenium 400-50 UNIT-MCG Caps       TAKE these medications        amLODipine 10 MG tablet  Commonly known as:  NORVASC  Take 1 tablet (10 mg total) by mouth daily.     apixaban 5 MG Tabs tablet  Commonly known as:  ELIQUIS  Take 1 tablet (5 mg total) by mouth 2 (two) times daily.     atorvastatin 10 MG tablet  Commonly known as:  LIPITOR  Take 1 tablet (10 mg total) by mouth daily at 6 PM.     ciprofloxacin 250 MG tablet  Commonly known as:  CIPRO  Take 1 tablet (250 mg total) by mouth 2 (two) times daily.     diltiazem 60 MG tablet  Commonly known as:  CARDIZEM  Take 1 tablet (60 mg total) by mouth every 8 (eight) hours.     hydrALAZINE 25 MG tablet  Commonly known as:  APRESOLINE  Take 1 tablet (25 mg total) by mouth every 8 (eight) hours.     lisinopril 20 MG tablet  Commonly known as:  PRINIVIL,ZESTRIL  Take 1 tablet (20 mg total) by  mouth 2 (two) times daily.     potassium chloride 20 MEQ/15ML (10%) Soln  Take 15 mLs (20 mEq total) by mouth 2 (two) times daily.        LABORATORY STUDIES CBC    Component Value Date/Time   WBC 7.7 07/28/2015 0237   WBC 5.0 10/13/2009 1101   RBC 4.45 07/28/2015 0237   RBC 4.34 10/13/2009 1101   HGB 14.0 07/28/2015 0237   HGB 13.7 10/13/2009 1101   HCT 42.1 07/28/2015 0237   HCT 40.8 10/13/2009 1101   PLT 145* 07/28/2015 0237   PLT 180 10/13/2009 1101   MCV 94.6 07/28/2015 0237   MCV 93.9 10/13/2009 1101   MCH 31.5 07/28/2015 0237   MCH 31.6 10/13/2009 1101   MCHC 33.3 07/28/2015 0237   MCHC 33.6 10/13/2009 1101   RDW 13.0 07/28/2015 0237   RDW 12.5 10/13/2009 1101   LYMPHSABS 1.6 07/21/2015 0332   LYMPHSABS 1.8 10/13/2009 1101   MONOABS 1.2* 07/21/2015 0332   MONOABS 0.3 10/13/2009 1101   EOSABS 0.0 07/21/2015 0332   EOSABS 0.1 10/13/2009 1101   BASOSABS 0.0 07/21/2015 0332   BASOSABS 0.0 10/13/2009 1101   CMP    Component Value Date/Time   NA 143 07/28/2015 0237   K 3.9 07/28/2015 0237   CL 111 07/28/2015 0237   CO2 24 07/28/2015 0237    GLUCOSE 144* 07/28/2015 0237   BUN 16 07/28/2015 0237   CREATININE 0.62 07/28/2015 0237   CALCIUM 8.6* 07/28/2015 0237   PROT 7.2 07/19/2015 2121   ALBUMIN 3.2* 07/21/2015 0332   AST 25 07/19/2015 2121   ALT 26 07/19/2015 2121   ALKPHOS 107 07/19/2015 2121   BILITOT 0.7 07/19/2015 2121   GFRNONAA >60 07/28/2015 0237   GFRAA >60 07/28/2015 0237   COAGS Lab Results  Component Value Date   INR 1.20 07/19/2015   Lipid Panel    Component Value Date/Time   CHOL 129 07/20/2015 0350   TRIG 33 07/20/2015 0350   HDL 48 07/20/2015 0350   CHOLHDL 2.7 07/20/2015 0350   VLDL 7 07/20/2015 0350   LDLCALC 74 07/20/2015 0350   HgbA1C  Lab Results  Component Value Date   HGBA1C 7.1* 07/20/2015   Cardiac Panel (last 3 results) No results for input(s): CKTOTAL, CKMB, TROPONINI, RELINDX in the last 72 hours. Urinalysis    Component Value Date/Time   COLORURINE RED* 07/24/2015 1153   APPEARANCEUR TURBID* 07/24/2015 1153   LABSPEC 1.017 07/24/2015 1153   PHURINE 6.0 07/24/2015 1153   GLUCOSEU NEGATIVE 07/24/2015 1153   HGBUR LARGE* 07/24/2015 1153   BILIRUBINUR NEGATIVE 07/24/2015 1153   KETONESUR 15* 07/24/2015 1153   PROTEINUR 30* 07/24/2015 1153   NITRITE NEGATIVE 07/24/2015 1153   LEUKOCYTESUR LARGE* 07/24/2015 1153   Urine Drug Screen     Component Value Date/Time   LABOPIA NONE DETECTED 07/19/2015 2233   COCAINSCRNUR NONE DETECTED 07/19/2015 2233   LABBENZ NONE DETECTED 07/19/2015 2233   AMPHETMU NONE DETECTED 07/19/2015 2233   THCU NONE DETECTED 07/19/2015 2233   LABBARB NONE DETECTED 07/19/2015 2233    Alcohol Level    Component Value Date/Time   ETH <5 07/19/2015 2121     SIGNIFICANT DIAGNOSTIC STUDIES Ct Head Wo Contrast 07/20/2015  No acute intracranial process, status post cerebral angiogram with residual intravascular contrast. Involutional changes and mild chronic small vessel ischemic disease.  07/19/2015  No acute intracranial hemorrhage. Age-related  atrophy and chronic microvascular ischemic disease. If symptoms persist and there are  no contraindications, MRI may provide better evaluation if clinically indicated.   Ct Angio Head and Neck W/cm &/or Wo Cm 07/19/2015  1. Positive for emergent large vessel occlusion at the left ICA terminus. Thrombus from the level of the left posterior communicating artery extending into both the left M1 and A1 segments. Some early reconstituted left MCA and ACA enhancement.  2. Large area of abnormal left MCA perfusion characteristics, but with relatively preserved cerebral blood volume and delayed collateral enhancement on the perfusion source images.  3. In conjunction with the noncontrast head CT (reported separately) the above constellation is favorable for endovascular reperfusion.  4. No superimposed arterial stenosis in the neck. Tortuous bilateral carotid arteries.  5. Superimposed intracranial atherosclerosis elsewhere including moderate proximal basilar stenosis, mild stenosis of the dominant distal right vertebral artery, and mild to moderate bilateral PCA and right MCA branch irregularity.  6. Bilateral upper lobe pulmonary ground-glass opacity and septal thickening may indicate developing interstitial edema.   Ct Cerebral Perfusion W/cm 07/19/2015  1. Positive for emergent large vessel occlusion at the left ICA terminus. Thrombus from the level of the left posterior communicating artery extending into both the left M1 and A1 segments. Some early reconstituted left MCA and ACA enhancement.  2. Large area of abnormal left MCA perfusion characteristics, but with relatively preserved cerebral blood volume and delayed collateral enhancement on the perfusion source images.  3. In conjunction with the noncontrast head CT (reported separately) the above constellation is favorable for endovascular reperfusion.  4. No superimposed arterial stenosis in the neck. Tortuous bilateral carotid arteries.   5. Superimposed intracranial atherosclerosis elsewhere including moderate proximal basilar stenosis, mild stenosis of the dominant distal right vertebral artery, and mild to moderate bilateral PCA and right MCA branch irregularity.  6. Bilateral upper lobe pulmonary ground-glass opacity and septal thickening may indicate developing interstitial edema.   Mr Brain Wo Contrast 07/20/2015 IMPRESSION: Acute large LEFT anterior cerebral artery territory infarct. Acute small LEFT middle cerebral artery territory infarct, predominately involving the basal ganglia and LEFT frontal lobe. 3 mm LEFT-to-RIGHT midline shift. Moderate chronic small vessel ischemic disease and old small LEFT cerebellar infarct.   2D echo  - Left ventricle: The cavity size was normal. There was mild concentric hypertrophy. Systolic function was vigorous. The estimated ejection fraction was in the range of 65% to 70%.  - Aortic valve: Valve area (VTI): 1.51 cm^2. Valve area (Vmax): 1.74 cm^2. Valve area (Vmean): 1.74 cm^2. - Mitral valve: There was mild regurgitation. - Left atrium: The atrium was mildly dilated. - Right atrium: The atrium was mildly dilated. - Atrial septum: No defect or patent foramen ovale was identified.  Dg Chest Port 1 View 07/20/2015  Left lung base and right hilar hazy densities concerning for pneumonia superimposed on background of congestion/edema. Clinical correlation and follow-up recommended. Endotracheal tube above the carina.   Modified Barium Swallow with speech therapy 07/23/2015 Delayed swallow initiation noted Mild aspiration risk Dysphagia 2 diet with thin liquids recommended     HISTORY OF PRESENT ILLNESS Joy Holden is an 82 y.o. female history of atrial fibrillation and hypertension brought to the emergency room following acute onset of speech abnormality, right facial droop and right hemiplegia. Patient was driving at the time of onset of her deficits. She was last known well  at about 8:15 PM on 07/19/2015. She has no previous of stroke nor TIA. She has not been on anticoagulation or antiplatelet therapy. She reportedly is allergic to aspirin. CT  scan of her head showed no acute intracranial abnormality. CT angiogram however showed occlusion of left ICA terminus with thrombus extending from the level of posterior indicating artery both M1 and A1 segments. Patient was treated with IV TPA 07/19/2015 at 2215 with no change in deficits. Her NIH stroke score was 26. She was subsequently taken to interventional radiology suite for further management. Blood pressure was elevated, requiring intervention with hydralazine IV. Underwent thrombectomy and intra-arterial TPA by Dr. Estanislado Pandy.   HOSPITAL COURSE Ms. Joy Holden is a 82 y.o. female with history of afib not on AC, HTN, asthma presenting with speech abnormality, right facial droop, and right hemiplegia.CT angiogram showed left ICA terminus occlusion with thrombus. She received IV TPA e then was sent to interventional radiology where she underwent thrombectomy and intra-arterial TPA to TICI2b revascularization. She was admitted to the neuro ICU. She was extubated on day 4 with DO NOT RESUSCITATE orders. Family did not want trach or PEG. She progressively regained swallow, now on dysphagia to thin liquid diet. She had complications of UTI and hypokalemia, which were treated. Family has opted for skilled nursing facility, and she will be discharged there.  Stroke: Dominant left MCA and ACA territory infarct embolic secondary to afib not on AC, s/p IV tPA and IA tPA with mechanical thrombectomy with TICI2b revasculization.  Resultant Right hemiplegia, aphasia and right field cut  MRI left MCA and ACA territory infarcts  CTA head and neck - left terminal ICA thrombus, bilateral siphon stenosis, right M1 stenosis  2D Echo EF 65-70%  LDL 74  HgbA1c 7.1  No antithrombotic prior to admission, initially started on ASA  325mg m been changed to Eliquis 5mg  bid starting Friday (07/25/2015 ) at 5-7 days post stroke due to moderate to large size of infarct.  Patient counseled to be compliant with her antithrombotic medications  Ongoing aggressive stroke risk factor management  Family does not want trach or pain. Patient made DO NOT RESUSCITATE.  Therapy recommendations: SNF  Disposition: SNF  Respiratory failure, resolved  Intubated for neuro intervention  Critical care managed  Extubated 07/22/2015 (Day 4) with DO NOT INTUBATE orders  Chronic atrial fibrillation  Not on AC PTA  Now on Eliquis  Home cardizem resumed for rate control (changed from extended release to regular so it can be crushed in applesauce)  Urinary tract infection with Hematuria   Hematuria clearing  Negative fever or leukocytosis  On foley - no known trauma  Urine culture 07/24/2015 - greater than 100,000 colonies of Escherichia coli. (Sensitive to Cipro)  Change Cipro from IV to PO, first dose 07/25/2015. We will treat for 7 days.  Hypertension  Blood pressure elevated at times.  Resumed home PO meds and increased lisinopril to 20mg  bid and added hydralazine.   Continue amlodipine and diltiazem. (2 calcium channel blockers)  Hyperlipidemia  Home meds: No lipid lowering medications PTA  LDL 74, goal < 70  Now on Lipitor 10 mg daily  Continue statin on discharge  Other Stroke Risk Factors  Advanced age  Dysphagia  Secondary to stroke  Modified barium swallow 07/23/15  Mild aspiration risk  Dysphagia 2 diet with thin liquids   Monitor PO intake  Other Active Problems  Mild thrombocytopenia  Hypokalemia - continue to supplement - magnesium level was normal    DISCHARGE EXAM Blood pressure 120/61, pulse 74, temperature 97.8 F (36.6 C), temperature source Oral, resp. rate 20, height 5\' 2"  (1.575 m), weight 66.9 kg (147 lb 7.8 oz),  SpO2 93 %. General - Well nourished, well developed,  African-American female.  Ophthalmologic - Fundi not visualized due to incooperation. Cardiovascular - irregularly irregular heart rate and rhythm. Neuro - awake alert, but not following commands, global aphasia. PERRL, left eye gaze preference, not blinking to visual threat on the right, with right neglect. Right facial droop and right hemiplegia, 0/5 RUE and trace withdraw on pain at RLE, left UE and LE spontaneous movement against gravity. DTR 1+, no babinski. Sensation, coordination and gait not tested.   Discharge Diet   DIET DYS 2 Room service appropriate?: Yes; Fluid consistency:: Thin liquids  DISCHARGE PLAN  Disposition:  Discharge to skilled nursing facility for ongoing PT, OT and ST.    Eliquis (apixaban) daily for secondary stroke prevention.  Follow-up FULP, CAMMIE, MD in 2 weeks.  Follow-up with Dr. Antony Contras, Stroke Clinic in 2 months.  45 minutes were spent preparing discharge.  DeWitt [place] for Pager information 07/28/2015 2:36 PM  I have personally examined this patient, reviewed notes, independently viewed imaging studies, participated in medical decision making and plan of care. I have made any additions or clarifications directly to the above note. Agree with note above.   Antony Contras, MD Medical Director [facility] Pager: [phone] 07/28/2015 2:37 PM

## 2015-07-28 NOTE — Care Management Important Message (Signed)
Important Message  Patient Details  Name: Joy Holden MRN: [id number] Date of Birth: [birth year]   Medicare Important Message Given:  Yes    Lemar Bakos P Koby Hartfield 07/28/2015, 1:26 PM

## 2015-07-28 NOTE — Clinical Social Work Placement (Signed)
   CLINICAL SOCIAL WORK PLACEMENT  NOTE  Date:  07/28/2015  Patient Details  Name: Joy Holden MRN: [id number] Date of Birth: [birth year]  Clinical Social Work is seeking post-discharge placement for this patient at the [place] level of care (*CSW will initial, date and re-position this form in  chart as items are completed):  Yes   Patient/family provided with [place] Work Department's list of facilities offering this level of care within the geographic area requested by the patient (or if unable, by the patient's family).  Yes   Patient/family informed of their freedom to choose among providers that offer the needed level of care, that participate in Medicare, Medicaid or managed care program needed by the patient, have an available bed and are willing to accept the patient.  Yes   Patient/family informed of [facility]'s ownership interest in [facility] and [facility], as well as of the fact that they are under no obligation to receive care at these facilities.  PASRR submitted to EDS on       PASRR number received on       Existing PASRR number confirmed on       FL2 transmitted to all facilities in geographic area requested by pt/family on 07/24/15     FL2 transmitted to all facilities within larger geographic area on       Patient informed that his/her managed care company has contracts with or will negotiate with certain facilities, including the following:        Yes   Patient/family informed of bed offers received.  Patient chooses bed at  [facility] )     Physician recommends and patient chooses bed at      Patient to be transferred to  [facility] ) on 07/28/15.  Patient to be transferred to facility by  [facility] )     Patient family notified on 07/28/15 of transfer.  Name of family member notified:   (Pt's granddtr, Denton Ar)     PHYSICIAN Please sign FL2, Please sign DNR      Additional Comment:    _______________________________________________ Rozell Searing, LCSW 07/28/2015, 1:46 PM

## 2015-07-28 NOTE — Clinical Social Work Note (Signed)
Clinical Social Worker facilitated patient discharge including contacting patient family and facility to confirm patient discharge plans.  Clinical information faxed to facility and family agreeable with plan.  CSW arranged ambulance transport via [facility] to [facility].  RN to call report prior to discharge.  Clinical Social Worker will sign off for now as social work intervention is no longer needed. Please consult [place] again if new need arises.  Glendon Axe, MSW, LCSWA [phone] 07/28/2015 1:52 PM

## 2015-07-28 NOTE — Care Management Note (Signed)
Case Management Note  Patient Details  Name: Joy Holden MRN: [id number] Date of Birth: 03-13-1925  Subjective/Objective:                    Action/Plan: Patient discharging to [facility] today. No further needs per CM.   Expected Discharge Date:                  Expected Discharge Plan:  Skilled Nursing Facility  In-House Referral:  Clinical Social Work  Discharge planning Services  CM Consult  Post Acute Care Choice:    Choice offered to:     DME Arranged:    DME Agency:     HH Arranged:    [place] Agency:     Status of Service:  In process, will continue to follow  Medicare Important Message Given:  Yes Date Medicare IM Given:    Medicare IM give by:    Date Additional Medicare IM Given:    Additional Medicare Important Message give by:     If discussed at [place] of Stay Meetings, dates discussed:    Additional Comments:  Pollie Friar, RN 07/28/2015, 3:54 PM

## 2015-07-28 NOTE — Progress Notes (Signed)
Speech Language Pathology Treatment: Cognitive-Linquistic  Patient Details Name: Joy Holden MRN: [id number] DOB: [birth year] Today's Date: 07/28/2015 Time: [id number] SLP Time Calculation (min) (ACUTE ONLY): 15 min  Assessment / Plan / Recommendation Clinical Impression  Skilled treatment session focused on addressing dysphagia goals.  Upon SLP arriving patient appeared to be sleeping.  Patient aroused with verbal stimuli but drifted back asleep.  SLP provided an upright position in bed, stimulating environment, and functional task of face washing to increase arousal and participation. Patient unable to verbalize greetings or name today.  Verbal expression marked by perseverative errors "well, well, well,"  with Max assist multimodal cues patient was able to complete counting task from 1-5.  Despite Max cuing and ability to transition verbal expression, verbal perseverative errors persisted into next tasks.  SLP will continue efforts.    HPI HPI: Ms. Joy Holden is a 82 y.o. female with history of afib not on AC, HTN, asthma presenting with speech abnormality, right facial droop, and right hemiplegia. Pt recieved IV tPA on 3/25. MRI shows left MCA and ACA territory infarcts. Intubated from 3/25 to 3/28.       SLP Plan  Continue with current plan of care     Recommendations                Follow up Recommendations: 24 hour supervision/assistance;Skilled Nursing facility Plan: Continue with current plan of care     GO              Carmelia Roller., CCC-SLP [id number]   [place] 07/28/2015, 4:39 PM

## 2015-07-29 ENCOUNTER — Other Ambulatory Visit (HOSPITAL_COMMUNITY): Payer: Self-pay | Admitting: Interventional Radiology

## 2015-07-29 ENCOUNTER — Non-Acute Institutional Stay (SKILLED_NURSING_FACILITY): Payer: PPO | Admitting: Internal Medicine

## 2015-07-29 ENCOUNTER — Encounter: Payer: Self-pay | Admitting: Internal Medicine

## 2015-07-29 ENCOUNTER — Telehealth (HOSPITAL_COMMUNITY): Payer: Self-pay

## 2015-07-29 DIAGNOSIS — D696 Thrombocytopenia, unspecified: Secondary | ICD-10-CM | POA: Diagnosis not present

## 2015-07-29 DIAGNOSIS — I10 Essential (primary) hypertension: Secondary | ICD-10-CM

## 2015-07-29 DIAGNOSIS — E876 Hypokalemia: Secondary | ICD-10-CM | POA: Diagnosis not present

## 2015-07-29 DIAGNOSIS — G8191 Hemiplegia, unspecified affecting right dominant side: Secondary | ICD-10-CM

## 2015-07-29 DIAGNOSIS — N390 Urinary tract infection, site not specified: Secondary | ICD-10-CM | POA: Diagnosis not present

## 2015-07-29 DIAGNOSIS — I480 Paroxysmal atrial fibrillation: Secondary | ICD-10-CM

## 2015-07-29 DIAGNOSIS — R1310 Dysphagia, unspecified: Secondary | ICD-10-CM | POA: Diagnosis not present

## 2015-07-29 DIAGNOSIS — I69391 Dysphagia following cerebral infarction: Secondary | ICD-10-CM

## 2015-07-29 DIAGNOSIS — R4701 Aphasia: Secondary | ICD-10-CM | POA: Diagnosis not present

## 2015-07-29 DIAGNOSIS — E785 Hyperlipidemia, unspecified: Secondary | ICD-10-CM

## 2015-07-29 DIAGNOSIS — I639 Cerebral infarction, unspecified: Secondary | ICD-10-CM

## 2015-07-29 DIAGNOSIS — E46 Unspecified protein-calorie malnutrition: Secondary | ICD-10-CM

## 2015-07-29 DIAGNOSIS — I63032 Cerebral infarction due to thrombosis of left carotid artery: Secondary | ICD-10-CM

## 2015-07-29 DIAGNOSIS — R5381 Other malaise: Secondary | ICD-10-CM | POA: Diagnosis not present

## 2015-07-29 DIAGNOSIS — B9620 Unspecified Escherichia coli [E. coli] as the cause of diseases classified elsewhere: Secondary | ICD-10-CM

## 2015-07-29 NOTE — Progress Notes (Signed)
LOCATION: [facility]  PCP: Antony Blackbird, MD   Code Status: Full Code  Goals of care: Advanced Directive information Advanced Directives 07/23/2015  Does patient have an advance directive? Yes  Type of Paramedic of [facility] [place];Living will  Does patient want to make changes to advanced directive? No - Patient declined  Copy of advanced directive(s) in chart? Yes       Extended Emergency Contact Information Primary Emergency Contact: Few,Brianna Address: [address]          [place], [place] [address] Johnnette Litter of [place] Phone: [phone] Relation: [place] Secondary Emergency Contact: Charlyne Mom [place] of [place] Mobile Phone: [phone] Relation: Grandaughter   Allergies  Allergen Reactions  . Penicillins Rash    No other information available at this time  . Aspirin Other (See Comments)    Burning in stomach  . Morphine Other (See Comments)    delusions    Chief Complaint  Patient presents with  . New Admit To SNF    New Admission     HPI:  Patient is a 82 y.o. female seen today for short term rehabilitation post hospital admission from 07/19/15-07/28/15 with acute ischemic stroke with right facial droop, right hemiplegia and dysarthria. She received tPA and mechanical thrombectomy. Her brain imaging revealed left MCA and ACA territory infarct thought to be embolic in nature secondary to her afib. She was not on any anticoagulation prior to this stroke. She required intubation for neurological intervention. She was placed on eliquis for anticoagulation. She was also treated for e.coli UTI. She was seen by SLP team, underwent MBS on 07/23/15 and is now on dysphagia diet. She has PMH of HTN, Afib, HLD among others. She is seen in her room today. She has aphasia and does not participate in HPi and ROS  Review of Systems: Unable to obtain     Past Medical History  Diagnosis Date  . Hypertension   .  Cancer ([place])   . Asthma    Past Surgical History  Procedure Laterality Date  . Appendectomy    . Abdominal hysterectomy      PARTIAL  . Cervical spine surgery    . Myomectomy    . Breast surgery    . Ectopic pregnancy surgery    . Neck surgery      c-spine  . Radiology with anesthesia N/A 07/19/2015    Procedure: RADIOLOGY WITH ANESTHESIA;  Surgeon: Luanne Bras, MD;  Location: [facility];  Service: Radiology;  Laterality: N/A;   Social History:   reports that she has never smoked. She has never used smokeless tobacco. She reports that she does not drink alcohol or use illicit drugs.  Family History  Problem Relation Age of Onset  . Hypertension Mother   . Cancer Sister     BREAST    Medications:   Medication List       This list is accurate as of: 07/29/15  4:57 PM.  Always use your most recent med list.               amLODipine 10 MG tablet  Commonly known as:  NORVASC  Take 1 tablet (10 mg total) by mouth daily.     apixaban 5 MG Tabs tablet  Commonly known as:  ELIQUIS  Take 1 tablet (5 mg total) by mouth 2 (two) times daily.     atorvastatin 10 MG tablet  Commonly known as:  LIPITOR  Take 1  tablet (10 mg total) by mouth daily at 6 PM.     ciprofloxacin 250 MG tablet  Commonly known as:  CIPRO  Take 1 tablet (250 mg total) by mouth 2 (two) times daily.     diltiazem 60 MG tablet  Commonly known as:  CARDIZEM  Take 1 tablet (60 mg total) by mouth every 8 (eight) hours.     hydrALAZINE 25 MG tablet  Commonly known as:  APRESOLINE  Take 1 tablet (25 mg total) by mouth every 8 (eight) hours.     lisinopril 20 MG tablet  Commonly known as:  PRINIVIL,ZESTRIL  Take 1 tablet (20 mg total) by mouth 2 (two) times daily.     potassium chloride 20 MEQ/15ML (10%) Soln  Take 15 mLs (20 mEq total) by mouth 2 (two) times daily.        Immunizations:  There is no immunization history on file for this patient.   Physical Exam: Filed Vitals:   07/29/15  1653  BP: 146/80  Pulse: 92  Temp: 98.4 F (36.9 C)  TempSrc: Oral  Resp: 18  Height: 5\' 2"  (1.575 m)  Weight: 147 lb 14.4 oz (67.087 kg)  SpO2: 95%   Body mass index is 27.04 kg/(m^2).  General- elderly female, well built, in no acute distress Head- normocephalic, atraumatic Nose- no maxillary or frontal sinus tenderness, no nasal discharge Throat- moist mucus membrane  Eyes- PERRLA, EOMI, no pallor, no icterus, no discharge, normal conjunctiva, normal sclera Neck- no cervical lymphadenopathy Cardiovascular- irregular heart rate, no murmur Respiratory- bilateral clear to auscultation, no wheeze, no rhonchi, no crackles, no use of accessory muscles Abdomen- bowel sounds present, soft, non tender, foley catheter present Musculoskeletal- right sided hemiplegia, aphasia, left sided movement present, no leg edema Neurological- awake and alert  Skin- warm and dry, dressing to right forearm present Psychiatry- unable to assess   Labs reviewed: Basic Metabolic Panel:  Recent Labs  07/21/15 0332  07/26/15 0407 07/27/15 0437 07/28/15 07/28/15 0237  NA 144  < > 142 142 143 143  K 4.0  < > 2.5* 3.3*  --  3.9  CL 113*  < > 106 109  --  111  CO2 20*  < > 28 24  --  24  GLUCOSE 121*  < > 112* 132*  --  144*  BUN 21*  < > 10 13 16 16   CREATININE 0.74  < > 0.57 0.62 0.6 0.62  CALCIUM 8.7*  < > 8.2* 8.2*  --  8.6*  MG 2.0  --   --  1.7  --   --   PHOS 3.3  --   --   --   --   --   < > = values in this interval not displayed. Liver Function Tests:  Recent Labs  07/19/15 2121 07/21/15 0332  AST 25  --   ALT 26  --   ALKPHOS 107  --   BILITOT 0.7  --   PROT 7.2  --   ALBUMIN 4.2 3.2*   No results for input(s): LIPASE, AMYLASE in the last 8760 hours. No results for input(s): AMMONIA in the last 8760 hours. CBC:  Recent Labs  07/19/15 2121  07/20/15 0450 07/21/15 0332  07/26/15 0407 07/27/15 0437 07/28/15 07/28/15 0237  WBC 5.9  --  6.8 9.3  < > 6.0 7.0 7.7 7.7    NEUTROABS 2.7  --  5.4 6.4  --   --   --   --   --  HGB 14.4  < > 12.6 13.1  < > 14.1 13.4  --  14.0  HCT 44.0  < > 39.3 40.4  < > 41.4 41.9  --  42.1  MCV 94.8  --  93.6 93.7  < > 92.8 93.5  --  94.6  PLT 138*  --  117* 122*  < > 117* 133*  --  145*  < > = values in this interval not displayed. Cardiac Enzymes: No results for input(s): CKTOTAL, CKMB, CKMBINDEX, TROPONINI in the last 8760 hours. BNP: Invalid input(s): POCBNP CBG:  Recent Labs  07/28/15 0759 07/28/15 1127 07/28/15 1607  GLUCAP 115* 217* 137*    Radiological Exams: Ct Angio Head W/cm &/or Wo Cm  07/19/2015  CLINICAL DATA:  82 year old female code stroke patient with right side weakness. Initial encounter. EXAM: CT ANGIOGRAPHY HEAD AND NECK CT CEREBRAL PERFUSION WITH CONTRAST TECHNIQUE: Multidetector CT imaging of the head and neck was performed using the standard protocol during bolus administration of intravenous contrast. Multiplanar CT image reconstructions and MIPs were obtained to evaluate the vascular anatomy. Carotid stenosis measurements (when applicable) are obtained utilizing NASCET criteria, using the distal internal carotid diameter as the denominator. Cerebral CT perfusion was also performed and images were post processed into perfusion maps. CONTRAST:  42mL OMNIPAQUE IOHEXOL 350 MG/ML SOLN COMPARISON:  Noncontrast head CT 2133 hours today. FINDINGS: CT PERFUSION Left MCA territory abnormal mean-transit-time and time-to-peak with associated decreased cerebral blood flow. Cerebral blood volume is relatively preserved, suggesting compensated ischemia and penumbra. On CT profusion source images delayed collateral enhancement in the left MCA territory occurs. Furthermore, on the comparison noncontrast CT (reported separately) I estimate the ASPECTS score at 9 or 10, with little to no CT changes of left MCA ischemia. CTA NECK Skeleton: Osteopenia. No acute osseous abnormality identified. Other neck: Patchy ground-glass  opacity with pulmonary septal thickening in the visualized upper lungs. No superior mediastinal lymphadenopathy. Negative thyroid, larynx, pharynx, parapharyngeal spaces, retropharyngeal space, sublingual space, submandibular glands and parotid glands. No cervical lymphadenopathy. Aortic arch: Slight bovine type arch configuration. Minimal arch atherosclerosis. No great vessel origin stenosis. Right carotid system: Tortuous right CCA in the lower neck. Minimal for age atherosclerosis at the right carotid bifurcation. Tortuous cervical right ICA, otherwise negative to the skullbase. Left carotid system: Minimal plaque at the left CCA origin. Tortuous proximal left CCA. Minimal plaque at the left carotid bifurcation which is widely patent. Tortuous cervical left ICA but otherwise negative to the skullbase. See intracranial findings below. Vertebral arteries:No proximal subclavian artery stenosis. Normal vertebral artery origins. Tortuous bilateral V1 segments. The right vertebral artery is mildly dominant. No vertebral artery stenosis to the skullbase. CTA HEAD Posterior circulation: Non dominant left vertebral artery is diminutive beyond the left PICA origin. Normal right PICA origin. Mild calcified and soft plaque in the distal right V4 segment resulting in mild stenosis. Patent vertebrobasilar junction. Moderate proximal basilar artery irregularity and stenosis. Irregularity continues to the mid basilar artery. The distal basilar artery has a more normal appearance. Normal SCA and right PCA origins. Fetal type left PCA origin with enhancing left posterior communicating artery. The right posterior communicating artery is diminutive or absent. Bilateral PCA branches are irregular but patent. Anterior circulation: Patent right ICA siphon with calcified plaque. Mild right siphon stenosis. Patent right ICA terminus. Right MCA M1 segment is patent with mild to moderate irregularity. Right MCA bifurcation is patent with  moderate irregularity which continues into the right MCA branches. Proximal left ICA  siphon is patent but there is thrombus at the left ICA terminus extending from the level of the posterior communicating artery to the left MCA and ACA origins. Thrombus in both left M1 and A1 segments with reconstituted flow. Furthermore, the left A1 segment appears dominant. There is reconstituted flow at the anterior communicating artery and in the bilateral ACA branches which are irregular but patent. There is poor to intermediate degree of early reconstituted flow at the left MCA bifurcation. Attenuated left MCA M2 branches. Venous sinuses: Appear patent, but suboptimal venous contrast timing. Anatomic variants: Suspected dominant left ACA A1 segment. IMPRESSION: 1. Positive for emergent large vessel occlusion at the left ICA terminus. Thrombus from the level of the left posterior communicating artery extending into both the left M1 and A1 segments. Some early reconstituted left MCA and ACA enhancement. 2. Large area of abnormal left MCA perfusion characteristics, but with relatively preserved cerebral blood volume and delayed collateral enhancement on the perfusion source images. 3. In conjunction with the noncontrast head CT (reported separately) the above constellation is favorable for endovascular reperfusion. This was discussed by telephone with Dr. Wallie Char on 07/19/2015 at 2156 hours. 4. No superimposed arterial stenosis in the neck. Tortuous bilateral carotid arteries. 5. Superimposed intracranial atherosclerosis elsewhere including moderate proximal basilar stenosis, mild stenosis of the dominant distal right vertebral artery, and mild to moderate bilateral PCA and right MCA branch irregularity. 6. Bilateral upper lobe pulmonary ground-glass opacity and septal thickening may indicate developing interstitial edema. Electronically Signed   By: Genevie Ann M.D.   On: 07/19/2015 22:31   Ct Head Wo Contrast  07/20/2015   CLINICAL DATA:  Stroke, follow-up status post revascularization of LEFT internal carotid artery, LEFT middle cerebral and LEFT anterior cerebral arteries. EXAM: CT HEAD WITHOUT CONTRAST TECHNIQUE: Contiguous axial images were obtained from the base of the skull through the vertex without intravenous contrast. COMPARISON:  CT and CTA head July 19, 2015 FINDINGS: INTRACRANIAL CONTENTS: The ventricles and sulci are normal for age. No intraparenchymal hemorrhage, mass effect nor midline shift. Patchy supratentorial white matter hypodensities are less than expected for patient's age and though non-specific likely represent chronic small vessel ischemic disease. No acute large vascular territory infarcts. No abnormal extra-axial fluid collections. Basal cisterns are patent. Moderate calcific atherosclerosis of the carotid siphons. Intravascular residual contrast. ORBITS: The included ocular globes and orbital contents are non-suspicious. Status post bilateral ocular lens implants. SINUSES: The mastoid aircells and included paranasal sinuses are well-aerated. SKULL/SOFT TISSUES: No skull fracture. No significant soft tissue swelling. IMPRESSION: No acute intracranial process, status post cerebral angiogram with residual intravascular contrast. Involutional changes and mild chronic small vessel ischemic disease. Electronically Signed   By: Elon Alas M.D.   On: 07/20/2015 01:31   Ct Head Wo Contrast  07/19/2015  CLINICAL DATA:  82 year old female found out site. Right-sided weakness. Code stroke. EXAM: CT HEAD WITHOUT CONTRAST TECHNIQUE: Contiguous axial images were obtained from the base of the skull through the vertex without intravenous contrast. COMPARISON:  None FINDINGS: There is mild prominence of the ventricles and sulci compatible with age-related atrophy. Moderate periventricular and deep white matter chronic microvascular ischemic changes noted. There is no acute intracranial hemorrhage. No mass effect  or midline shift noted. Stop the visualized paranasal sinuses and mastoid air cells are clear. The calvarium is intact. IMPRESSION: No acute intracranial hemorrhage. Age-related atrophy and chronic microvascular ischemic disease. If symptoms persist and there are no contraindications, MRI may provide better evaluation if clinically  indicated. Electronically Signed   By: Anner Crete M.D.   On: 07/19/2015 21:56   Ct Angio Neck W/cm &/or Wo/cm  07/19/2015  CLINICAL DATA:  82 year old female code stroke patient with right side weakness. Initial encounter. EXAM: CT ANGIOGRAPHY HEAD AND NECK CT CEREBRAL PERFUSION WITH CONTRAST TECHNIQUE: Multidetector CT imaging of the head and neck was performed using the standard protocol during bolus administration of intravenous contrast. Multiplanar CT image reconstructions and MIPs were obtained to evaluate the vascular anatomy. Carotid stenosis measurements (when applicable) are obtained utilizing NASCET criteria, using the distal internal carotid diameter as the denominator. Cerebral CT perfusion was also performed and images were post processed into perfusion maps. CONTRAST:  63mL OMNIPAQUE IOHEXOL 350 MG/ML SOLN COMPARISON:  Noncontrast head CT 2133 hours today. FINDINGS: CT PERFUSION Left MCA territory abnormal mean-transit-time and time-to-peak with associated decreased cerebral blood flow. Cerebral blood volume is relatively preserved, suggesting compensated ischemia and penumbra. On CT profusion source images delayed collateral enhancement in the left MCA territory occurs. Furthermore, on the comparison noncontrast CT (reported separately) I estimate the ASPECTS score at 9 or 10, with little to no CT changes of left MCA ischemia. CTA NECK Skeleton: Osteopenia. No acute osseous abnormality identified. Other neck: Patchy ground-glass opacity with pulmonary septal thickening in the visualized upper lungs. No superior mediastinal lymphadenopathy. Negative thyroid,  larynx, pharynx, parapharyngeal spaces, retropharyngeal space, sublingual space, submandibular glands and parotid glands. No cervical lymphadenopathy. Aortic arch: Slight bovine type arch configuration. Minimal arch atherosclerosis. No great vessel origin stenosis. Right carotid system: Tortuous right CCA in the lower neck. Minimal for age atherosclerosis at the right carotid bifurcation. Tortuous cervical right ICA, otherwise negative to the skullbase. Left carotid system: Minimal plaque at the left CCA origin. Tortuous proximal left CCA. Minimal plaque at the left carotid bifurcation which is widely patent. Tortuous cervical left ICA but otherwise negative to the skullbase. See intracranial findings below. Vertebral arteries:No proximal subclavian artery stenosis. Normal vertebral artery origins. Tortuous bilateral V1 segments. The right vertebral artery is mildly dominant. No vertebral artery stenosis to the skullbase. CTA HEAD Posterior circulation: Non dominant left vertebral artery is diminutive beyond the left PICA origin. Normal right PICA origin. Mild calcified and soft plaque in the distal right V4 segment resulting in mild stenosis. Patent vertebrobasilar junction. Moderate proximal basilar artery irregularity and stenosis. Irregularity continues to the mid basilar artery. The distal basilar artery has a more normal appearance. Normal SCA and right PCA origins. Fetal type left PCA origin with enhancing left posterior communicating artery. The right posterior communicating artery is diminutive or absent. Bilateral PCA branches are irregular but patent. Anterior circulation: Patent right ICA siphon with calcified plaque. Mild right siphon stenosis. Patent right ICA terminus. Right MCA M1 segment is patent with mild to moderate irregularity. Right MCA bifurcation is patent with moderate irregularity which continues into the right MCA branches. Proximal left ICA siphon is patent but there is thrombus at the  left ICA terminus extending from the level of the posterior communicating artery to the left MCA and ACA origins. Thrombus in both left M1 and A1 segments with reconstituted flow. Furthermore, the left A1 segment appears dominant. There is reconstituted flow at the anterior communicating artery and in the bilateral ACA branches which are irregular but patent. There is poor to intermediate degree of early reconstituted flow at the left MCA bifurcation. Attenuated left MCA M2 branches. Venous sinuses: Appear patent, but suboptimal venous contrast timing. Anatomic variants: Suspected dominant left ACA  A1 segment. IMPRESSION: 1. Positive for emergent large vessel occlusion at the left ICA terminus. Thrombus from the level of the left posterior communicating artery extending into both the left M1 and A1 segments. Some early reconstituted left MCA and ACA enhancement. 2. Large area of abnormal left MCA perfusion characteristics, but with relatively preserved cerebral blood volume and delayed collateral enhancement on the perfusion source images. 3. In conjunction with the noncontrast head CT (reported separately) the above constellation is favorable for endovascular reperfusion. This was discussed by telephone with Dr. Wallie Char on 07/19/2015 at 2156 hours. 4. No superimposed arterial stenosis in the neck. Tortuous bilateral carotid arteries. 5. Superimposed intracranial atherosclerosis elsewhere including moderate proximal basilar stenosis, mild stenosis of the dominant distal right vertebral artery, and mild to moderate bilateral PCA and right MCA branch irregularity. 6. Bilateral upper lobe pulmonary ground-glass opacity and septal thickening may indicate developing interstitial edema. Electronically Signed   By: Genevie Ann M.D.   On: 07/19/2015 22:31   Mr Brain Wo Contrast  07/20/2015  CLINICAL DATA:  Stroke, status post revascularization of LEFT internal carotid artery and LEFT anterior and middle cerebral  arteries. History of atrial fibrillation, hypertension and cancer. EXAM: MRI HEAD WITHOUT CONTRAST TECHNIQUE: Multiplanar, multiecho pulse sequences of the brain and surrounding structures were obtained without intravenous contrast. COMPARISON:  CT head July 20, 2015 FINDINGS: Confluent reduced diffusion LEFT frontal lobe, predominately medial. Patchy reduced diffusion LEFT mesial temporal lobe LEFT basal ganglia, LEFT posterior frontal lobe with corresponding low ADC values and bright T2 FLAIR. 3 mm LEFT-to-RIGHT midline shift. No susceptibility artifact to suggest hemorrhage. Ventricles and sulci are overall normal for patient's age. Patchy FLAIR T2 hyperintense signal exclusive of the aforementioned abnormality. Old Small LEFT cerebellar infarct. No abnormal extra-axial fluid collections. Normal major intracranial vascular flow voids present at skull base. Status post bilateral ocular lens implants. No abnormal sellar expansion. No cerebellar tonsillar ectopia. No suspicious calvarial bone marrow signal. IMPRESSION: Acute large LEFT anterior cerebral artery territory infarct. Acute small LEFT middle cerebral artery territory infarct, predominately involving the basal ganglia and LEFT frontal lobe. 3 mm LEFT-to-RIGHT midline shift. Moderate chronic small vessel ischemic disease and old small LEFT cerebellar infarct. These results will be called to the ordering clinician or representative by the Radiologist Assistant, and communication documented in the PACS or zVision Dashboard. Electronically Signed   By: Elon Alas M.D.   On: 07/20/2015 22:25   Ct Cerebral Perfusion W/cm  07/19/2015  CLINICAL DATA:  82 year old female code stroke patient with right side weakness. Initial encounter. EXAM: CT ANGIOGRAPHY HEAD AND NECK CT CEREBRAL PERFUSION WITH CONTRAST TECHNIQUE: Multidetector CT imaging of the head and neck was performed using the standard protocol during bolus administration of intravenous contrast.  Multiplanar CT image reconstructions and MIPs were obtained to evaluate the vascular anatomy. Carotid stenosis measurements (when applicable) are obtained utilizing NASCET criteria, using the distal internal carotid diameter as the denominator. Cerebral CT perfusion was also performed and images were post processed into perfusion maps. CONTRAST:  20mL OMNIPAQUE IOHEXOL 350 MG/ML SOLN COMPARISON:  Noncontrast head CT 2133 hours today. FINDINGS: CT PERFUSION Left MCA territory abnormal mean-transit-time and time-to-peak with associated decreased cerebral blood flow. Cerebral blood volume is relatively preserved, suggesting compensated ischemia and penumbra. On CT profusion source images delayed collateral enhancement in the left MCA territory occurs. Furthermore, on the comparison noncontrast CT (reported separately) I estimate the ASPECTS score at 9 or 10, with little to no CT changes of  left MCA ischemia. CTA NECK Skeleton: Osteopenia. No acute osseous abnormality identified. Other neck: Patchy ground-glass opacity with pulmonary septal thickening in the visualized upper lungs. No superior mediastinal lymphadenopathy. Negative thyroid, larynx, pharynx, parapharyngeal spaces, retropharyngeal space, sublingual space, submandibular glands and parotid glands. No cervical lymphadenopathy. Aortic arch: Slight bovine type arch configuration. Minimal arch atherosclerosis. No great vessel origin stenosis. Right carotid system: Tortuous right CCA in the lower neck. Minimal for age atherosclerosis at the right carotid bifurcation. Tortuous cervical right ICA, otherwise negative to the skullbase. Left carotid system: Minimal plaque at the left CCA origin. Tortuous proximal left CCA. Minimal plaque at the left carotid bifurcation which is widely patent. Tortuous cervical left ICA but otherwise negative to the skullbase. See intracranial findings below. Vertebral arteries:No proximal subclavian artery stenosis. Normal vertebral  artery origins. Tortuous bilateral V1 segments. The right vertebral artery is mildly dominant. No vertebral artery stenosis to the skullbase. CTA HEAD Posterior circulation: Non dominant left vertebral artery is diminutive beyond the left PICA origin. Normal right PICA origin. Mild calcified and soft plaque in the distal right V4 segment resulting in mild stenosis. Patent vertebrobasilar junction. Moderate proximal basilar artery irregularity and stenosis. Irregularity continues to the mid basilar artery. The distal basilar artery has a more normal appearance. Normal SCA and right PCA origins. Fetal type left PCA origin with enhancing left posterior communicating artery. The right posterior communicating artery is diminutive or absent. Bilateral PCA branches are irregular but patent. Anterior circulation: Patent right ICA siphon with calcified plaque. Mild right siphon stenosis. Patent right ICA terminus. Right MCA M1 segment is patent with mild to moderate irregularity. Right MCA bifurcation is patent with moderate irregularity which continues into the right MCA branches. Proximal left ICA siphon is patent but there is thrombus at the left ICA terminus extending from the level of the posterior communicating artery to the left MCA and ACA origins. Thrombus in both left M1 and A1 segments with reconstituted flow. Furthermore, the left A1 segment appears dominant. There is reconstituted flow at the anterior communicating artery and in the bilateral ACA branches which are irregular but patent. There is poor to intermediate degree of early reconstituted flow at the left MCA bifurcation. Attenuated left MCA M2 branches. Venous sinuses: Appear patent, but suboptimal venous contrast timing. Anatomic variants: Suspected dominant left ACA A1 segment. IMPRESSION: 1. Positive for emergent large vessel occlusion at the left ICA terminus. Thrombus from the level of the left posterior communicating artery extending into both the  left M1 and A1 segments. Some early reconstituted left MCA and ACA enhancement. 2. Large area of abnormal left MCA perfusion characteristics, but with relatively preserved cerebral blood volume and delayed collateral enhancement on the perfusion source images. 3. In conjunction with the noncontrast head CT (reported separately) the above constellation is favorable for endovascular reperfusion. This was discussed by telephone with Dr. Wallie Char on 07/19/2015 at 2156 hours. 4. No superimposed arterial stenosis in the neck. Tortuous bilateral carotid arteries. 5. Superimposed intracranial atherosclerosis elsewhere including moderate proximal basilar stenosis, mild stenosis of the dominant distal right vertebral artery, and mild to moderate bilateral PCA and right MCA branch irregularity. 6. Bilateral upper lobe pulmonary ground-glass opacity and septal thickening may indicate developing interstitial edema. Electronically Signed   By: Genevie Ann M.D.   On: 07/19/2015 22:31   Dg Chest Port 1 View  07/20/2015  CLINICAL DATA:  82 year old female status post intubation. EXAM: PORTABLE CHEST 1 VIEW COMPARISON:  Chest radiograph dated 09/11/2009  FINDINGS: An endotracheal tube is noted with tip approximately 5 cm above the carina. Patchy areas of hazy density at the left lung base and perihilar region are concerning for pneumonia. There is diffuse interstitial prominence, left greater right which may be related to underlying congestive changes or edema. Trace fluid noted in the right minor fissure. No significant pleural effusion. No pneumothorax. Mild cardiomegaly. No acute osseous pathology. IMPRESSION: Left lung base and right hilar hazy densities concerning for pneumonia superimposed on background of congestion/edema. Clinical correlation and follow-up recommended. Endotracheal tube above the carina. Electronically Signed   By: Anner Crete M.D.   On: 07/20/2015 06:46   Dg Abd Portable 1v  07/21/2015  CLINICAL  DATA:  82 year old female with enteric tube adjustment EXAM: PORTABLE ABDOMEN - 1 VIEW COMPARISON:  Radiograph dated 07/21/2015 FINDINGS: There has been interval advancement of the enteric tube with tip now in the inferior left hemi abdomen adjacent to the left L4 pedicle and likely within the stomach. There is no bowel dilatation or free air. No radiopaque calculi identified. There is degenerative changes of the spine. No acute fracture. IMPRESSION: Interval advancement of the enteric tube the tip likely within the stomach. Electronically Signed   By: Anner Crete M.D.   On: 07/21/2015 21:29   Dg Abd Portable 1v  07/21/2015  CLINICAL DATA:  Orogastric tube placement EXAM: PORTABLE ABDOMEN - 1 VIEW COMPARISON:  Portable exam 1734 hours without priors for comparison FINDINGS: Tip of orogastric tube projects over stomach but the proximal side-port projects over distal esophagus ; recommend advancing tube 5 cm to place proximal side-port within stomach. Atelectasis versus consolidation LEFT lower lobe. Visualized bowel gas pattern normal. Bones demineralized. IMPRESSION: Proximal side-port of orogastric tube projects over stomach ; recommend advancing tube 5 cm. Findings called to [facility] on 07/21/2015 at 1858 hours. Electronically Signed   By: Lavonia Dana M.D.   On: 07/21/2015 18:59   Dg Swallowing Func-speech Pathology  07/23/2015  Objective Swallowing Evaluation: Type of Study: MBS-Modified Barium Swallow Study Patient Details Name: ADANELY MAREADY MRN: [id number] Date of Birth: May 08, 1924 Today's Date: 07/23/2015 Time: SLP Start Time (ACUTE ONLY): 1040-SLP Stop Time (ACUTE ONLY): 1055 SLP Time Calculation (min) (ACUTE ONLY): 15 min Past Medical History: Past Medical History Diagnosis Date . Hypertension  . Cancer ([place])  . Asthma  Past Surgical History: Past Surgical History Procedure Laterality Date . Appendectomy   . Abdominal hysterectomy     PARTIAL . Cervical spine surgery   . Myomectomy    . Breast surgery   . Ectopic pregnancy surgery   . Neck surgery     c-spine . Radiology with anesthesia N/A 07/19/2015   Procedure: RADIOLOGY WITH ANESTHESIA;  Surgeon: Luanne Bras, MD;  Location: [facility];  Service: Radiology;  Laterality: N/A; HPI: Ms. LINNE WITTENMYER is a 82 y.o. female with history of afib not on AC, HTN, asthma presenting with speech abnormality, right facial droop, and right hemiplegia. Pt recieved IV tPA on 3/25. MRI shows left MCA and ACA territory infarcts. Intubated from 3/25 to 3/28.  No Data Recorded Assessment / Plan / Recommendation CHL IP CLINICAL IMPRESSIONS 07/23/2015 Therapy Diagnosis -- Clinical Impression Pt demonstrates potential for oral dysphagia as pt was unwilling to take more than tiny bites, which family states is her baseline behavior when eating. Suspect potential for right sided pocketing during meal given overt weakness. Oropharyngeal dysphagia characterized by delayed swallow initation with risk for apsiraiton before the swallow with  larger boluses. This did not occur during the study. Pt was noted to cough after POs given, as she did in the room, but no penetration or aspiration was observed. Recommend a dys 2 (fine chop) diet with thin liquids via straw with full supervision and aspiration precautions.  Provided instruction to family during exam on feeding pt and appropriate diet choices. SLP will follow for tolerance.  Impact on safety and function Mild aspiration risk   CHL IP TREATMENT RECOMMENDATION 07/23/2015 Treatment Recommendations Therapy as outlined in treatment plan below   Prognosis 07/23/2015 Prognosis for Safe Diet Advancement Good Barriers to Reach Goals -- Barriers/Prognosis Comment -- CHL IP DIET RECOMMENDATION 07/23/2015 SLP Diet Recommendations Thin liquid;Dysphagia 2 (Fine chop) solids Liquid Administration via Cup;Straw Medication Administration Crushed with puree Compensations Slow rate;Small sips/bites;Minimize environmental distractions Postural  Changes Seated upright at 90 degrees   CHL IP OTHER RECOMMENDATIONS 07/23/2015 Recommended Consults -- Oral Care Recommendations Oral care BID Other Recommendations --   CHL IP FOLLOW UP RECOMMENDATIONS 07/23/2015 Follow up Recommendations Inpatient Rehab   CHL IP FREQUENCY AND DURATION 07/23/2015 Speech Therapy Frequency (ACUTE ONLY) min 2x/week Treatment Duration 2 weeks      CHL IP ORAL PHASE 07/23/2015 Oral Phase WFL Oral - Pudding Teaspoon -- Oral - Pudding Cup -- Oral - Honey Teaspoon -- Oral - Honey Cup -- Oral - Nectar Teaspoon -- Oral - Nectar Cup -- Oral - Nectar Straw -- Oral - Thin Teaspoon -- Oral - Thin Cup -- Oral - Thin Straw -- Oral - Puree -- Oral - Mech Soft -- Oral - Regular -- Oral - Multi-Consistency -- Oral - Pill -- Oral Phase - Comment --  CHL IP PHARYNGEAL PHASE 07/23/2015 Pharyngeal Phase Impaired Pharyngeal- Pudding Teaspoon -- Pharyngeal -- Pharyngeal- Pudding Cup -- Pharyngeal -- Pharyngeal- Honey Teaspoon -- Pharyngeal -- Pharyngeal- Honey Cup -- Pharyngeal -- Pharyngeal- Nectar Teaspoon -- Pharyngeal -- Pharyngeal- Nectar Cup Delayed swallow initiation-vallecula Pharyngeal -- Pharyngeal- Nectar Straw Delayed swallow initiation-vallecula Pharyngeal -- Pharyngeal- Thin Teaspoon -- Pharyngeal -- Pharyngeal- Thin Cup -- Pharyngeal -- Pharyngeal- Thin Straw Delayed swallow initiation-vallecula Pharyngeal -- Pharyngeal- Puree Delayed swallow initiation-vallecula Pharyngeal -- Pharyngeal- Mechanical Soft Delayed swallow initiation-vallecula Pharyngeal -- Pharyngeal- Regular -- Pharyngeal -- Pharyngeal- Multi-consistency -- Pharyngeal -- Pharyngeal- Pill NT Pharyngeal -- Pharyngeal Comment --  No flowsheet data found. No flowsheet data found. Herbie [place], MA CCC-SLP [phone] Lynann Beaver 07/23/2015, 11:02 AM              Ir Percutaneous Art Thrombectomy/infusion Intracranial Inc Diag Angio  07/23/2015  CLINICAL DATA:  Left-sided gaze deviation. Right sided hemiplegia. Occluded  left ICA, left MCA, and left ACA on CT angiogram. EXAM: IR PERCUTANEOUS ART THORMBECTOMY/INFUSION INTRACRANIAL INCLUDE DIAG ANGIO. ENDOVASCULAR COMPLETE REVASCULARIZATION OF OCCLUDED LEFT INTERNAL CAROTID ARTERY TERMINUS, THE LEFT MIDDLE CEREBRAL ARTERY AND THE LEFT ANTERIOR CEREBRAL ARTERY A1 SEGMENT WITH SUPERSELECTIVE INTRACRANIAL INTRA-ARTERIAL TPA AND MECHANICAL THROMBECTOMY: PROCEDURE: Contrast: 70 mL OMNIPAQUE IOHEXOL 300 MG/ML  SOLN As per general anesthesia. Following a full explanation of the procedure along with the potential associated complications, an informed witnessed consent was obtained. Risks of intracranial hemorrhage of 10-15%, worsening neurological deficit, ventilator dependency, death and inability to revascularize were all discussed in detail with the patient's family. Informed consent was obtained. The patient was put under general anesthesia by the Department of Anesthesiology at [facility]. The right groin was prepped and draped in the usual sterile fashion. Thereafter using modified Seldinger technique, transfemoral access into the right  common femoral artery was obtained without difficulty. Over a 0.035 inch guidewire a 5 French Pinnacle sheath was inserted. Through this, and also over a 0.035 inch guidewire a 5 [place] JB 1 catheter was advanced to the aortic arch region and selectively positioned in the left common carotid artery. An arteriogram was then performed centered over the carotid bifurcation and intracranially. There were no acute complications. The patient tolerated the procedure well. FINDINGS: The left common carotid arteriogram demonstrates the left external carotid artery and its major branches to be widely patent. The left internal carotid artery at the bulb to the cranial skull base was seen to opacify normally with mild tortuosity in the distal cervical segment. However slowly ascent of contrast was noted in the petrous and the cavernous segments. Complete  occlusion of the left internal carotid artery was seen just distal to the origin of the left posterior communicating artery. ENDOVASCULAR COMPLETE REVASCULARIZATION OF OCCLUDED LEFT INTERNAL CAROTID ARTERY TERMINUS, AND LEFT MIDDLE CEREBRAL ARTERY AND LEFT ANTERIOR CEREBRAL ARTERY WITH 5 MG OF SUPERSELECTIVE INTRACRANIAL AND INTRAARTERIAL TPA AND 1 PASS WITH THE SOLITAIRE FR 4 X 40 MM RETRIEVAL DEVICE: The diagnostic JB 1 catheter in the left common carotid artery was exchanged over a 0.035 inch 300 cm Rosen exchange guidewire for an 8 French 55 cm BriteTip neurovascular sheath using biplane roadmap technique and constant fluoroscopic guidance. Good aspiration was obtained from the hub of the 8 [place] BriteTip neurovascular sheath. This was then connected to continuous heparinized saline infusion. Over a 0.035 inch Rosen exchange guidewire, an 8 French 80 cm Flowgate balloon guide catheter which had been prepped with 50% contrast and 50% heparinized saline was then advanced and positioned in the left common carotid artery just proximal to the bifurcation. The guidewire was removed. Good aspiration was obtained from the hub of the 8 [place] Flowgate guide catheter. A gentle contrast injection demonstrated no evidence spasms, dissections or of intraluminal filling defects. Over a 0.035 inch Roadrunner guidewire, using biplane roadmap technique and constant fluoroscopic guidance, the 8 [place] Flowgate guide catheter was then advanced to the junction of the distal third and middle third of the left ICA. The guidewire was removed. Good aspiration was obtained from the hub of the 8 [place] Flowgate guide catheter. A gentle contrast injection demonstrated no evidence spasms, dissections or of intraluminal filling defects. A control arteriogram performed intracranially demonstrated no change in the occluded left internal carotid artery. At this time, in a coaxial manner and with constant heparinized saline infusion using  biplane roadmap technique and constant fluoroscopic guidance, a combination of a 5 [place] Catalyst 115 cm guide catheter inside of which was a Trevo-ProVue 021 microcatheter was advanced over a 0.014 inch Softip Synchro micro guidewire to the distal end of the Flowgate guide catheter. With the micro guidewire leading with a J-tip configuration, the combination was then navigated with a torque device into the supraclinoid left ICA. The micro guidewire was removed. Approximately 5 mg of superselective intracranial intra-arterial tPA dissolved in 5 cc of normal saline was then infused over about 6 minutes into the clot. Over a 0.014 inch Softip Synchro micro guidewire, the microcatheter combination was then advanced into the M2-M3 region of the dominant superior division of the left middle cerebral artery. The guidewire was removed. Good aspiration was obtained from the hub of the microcatheter. A gentle contrast injection demonstrated antegrade flow distally. At this time, a 4 x 40 mm Solitaire FR retrieval device was then advanced in a coaxial  manner and with constant heparinized saline infusion using biplane roadmap technique and constant fluoroscopic guidance to the distal end of the microcatheter. The O rings on the delivery microcatheter and the delivery micro guidewire were then loosened. With slight forward gentle traction with the right hand on the delivery micro guidewire with the left hand, the delivery microcatheter was then gently retrieved unsheathing the retrieval device. A control arteriogram performed through the 5 [place] Catalyst guide catheter demonstrated partial revascularization of the occluded left internal carotid artery terminus. Large filling defect remained within the supraclinoid left ICA segment. The proximal portion of the device was then captured into the microcatheter. The balloon was then inflated in the left internal carotid artery for proximal flow arrest. Thereafter using constant  aspiration with a 60 mL syringe at the side port of the 8 [place] Flowgate guide catheter, the combination of the delivery micro guidewire, the retrieval and the 5 [place] Catalyst guide catheter were then gently retrieved and removed. Clot was noted within the Tuohy-Borst. Aspiration with a 60 mL syringe was continued as the balloon was then deflated in the left internal carotid artery. The aspirate also demonstrated two chunks of clot. A control arteriogram performed through the 8 [place] Flowgate guide catheter in the left internal carotid artery demonstrated complete revascularization of the left ICA supraclinoid segment, the left middle cerebral artery and the left anterior cerebral artery proximally. The delayed arterial run continued to demonstrate a distal left anterior cerebral artery slow flow which was also noted on the initial CT angiogram of the brain. 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 The 8 French neurovascular sheath was then retrieved into the abdominal aorta as was the Flowgate guide catheter. These were then exchanged are over a J-tip guidewire for a 9 French Pinnacle sheath which was then connected to continuous heparinized saline infusion. Throughout the procedure, the patient's hemodynamic status, the heart rate and neurological status remained stable. No angiographic evidence of extravasation was seen. No mass effect was noted on the intracranial vasculature. The patient was then transported to the CT scanner for postprocedural CT scan of brain. IMPRESSION: Status post complete revascularization of the occluded left internal carotid artery terminus, the left middle cerebral artery and the proximal left anterior cerebral artery using 5 mg of super selective intracranial intraarterial tPA, and one pass with the Solitaire FR 4 x 40 mm retrieval device achieving a TICI 2 b reperfusion. Electronically Signed   By: Luanne Bras M.D.   On: 07/21/2015 09:36   2D echo  - Left ventricle: The cavity size was normal. There was mild concentric hypertrophy. Systolic function was vigorous. The estimated ejection fraction was in the range of 65% to 70%.   - Aortic valve: Valve area (VTI): 1.51 cm^2. Valve area (Vmax): 1.74 cm^2. Valve area (Vmean): 1.74 cm^2. - Mitral valve: There was mild regurgitation. - Left atrium: The atrium was mildly dilated. - Right atrium: The atrium was mildly dilated. - Atrial septum: No defect or patent foramen ovale was identified.   Modified Barium Swallow with speech therapy 07/23/2015 Delayed swallow initiation noted Mild aspiration risk Dysphagia 2 diet with thin liquids recommended    Assessment/Plan  Physical deconditioning Will have her work with physical therapy and occupational therapy team to help with gait training and muscle strengthening exercises.fall precautions. Skin care. Encourage to be out of bed.   Acute ischemic stroke Of embolic nature. Continue eliquis for stroke prophylaxis. Continue  statin. Monitor BP. Provide therapy as above. Neurology f/u appoitnment  Right sided hemiplegia Post CVA. Continue eliquis. Will have patient work with PT/OT as tolerated to regain strength and restore function.  Fall precautions are in place.  Aphasia Will have her work with SLP team.   Dysphagia Continue dysphagia diet, get SLP consult, aspiration precautions and assistance with feeding  afib Rate controlled. Continue eliquis 5 mg bid for anticoagulation. Continue cardizem 60 mg tid for rate control  Thrombocytopenia No bleed reported by staff. Monitor platelet count  Hypokalemia Continue kcl supplement, check bmp  HTN Check bp bid x 1 week. Continue norvasc 10 mg daily, hydralazine 25 mg tid, lisinopril 20 mg daily. Check bmp  E.coli UTI Continue and complete ciprofloxacin on 07/31/15. Continue foley care.   Dysphagia Aspiration precautions, get SLP  consult, assistance with feed for now  Protein calorie malnutrition Get dietary consult  HLD Continue lipitor 10 mg daily    Goals of care: short term rehabilitation   Labs/tests ordered: cbc, cmp 07/31/15  Family/ staff Communication: reviewed care plan with patient and nursing supervisor    Blanchie Serve, MD Internal Medicine [place], [place] [address] Cell Phone (Monday-Friday 8 am - 5 pm): [phone] On Call: [phone] and follow prompts after 5 pm and on weekends Office Phone: [phone] Office Fax: [phone]

## 2015-07-29 NOTE — Telephone Encounter (Signed)
Called pt's granddaughter to schedule f/u appt for pt with Dr. Estanislado Pandy. Granddaughter informed us that pt was in [facility] in [place], [place]. [place] and left a VM for schedulers to call back and set up appt. AW

## 2015-07-31 LAB — BASIC METABOLIC PANEL
BUN: 22 mg/dL — AB (ref 4–21)
CREATININE: 0.6 mg/dL (ref 0.5–1.1)
GLUCOSE: 124 mg/dL
Potassium: 4 mmol/L (ref 3.4–5.3)
Sodium: 144 mmol/L (ref 137–147)

## 2015-07-31 LAB — CBC AND DIFFERENTIAL
HEMATOCRIT: 43 % (ref 36–46)
Hemoglobin: 13.8 g/dL (ref 12.0–16.0)
Platelets: 209 10*3/uL (ref 150–399)
WBC: 5.9 10*3/mL

## 2015-07-31 LAB — HEPATIC FUNCTION PANEL
ALT: 15 U/L (ref 7–35)
AST: 25 U/L (ref 13–35)
Alkaline Phosphatase: 90 U/L (ref 25–125)
BILIRUBIN, TOTAL: 0.4 mg/dL

## 2015-08-25 ENCOUNTER — Encounter: Payer: Self-pay | Admitting: Family

## 2015-08-25 ENCOUNTER — Ambulatory Visit (HOSPITAL_COMMUNITY): Admission: RE | Admit: 2015-08-25 | Payer: PPO | Source: Ambulatory Visit

## 2015-08-25 ENCOUNTER — Non-Acute Institutional Stay (SKILLED_NURSING_FACILITY): Payer: PPO | Admitting: Family

## 2015-08-25 DIAGNOSIS — R1310 Dysphagia, unspecified: Secondary | ICD-10-CM | POA: Diagnosis not present

## 2015-08-25 DIAGNOSIS — I480 Paroxysmal atrial fibrillation: Secondary | ICD-10-CM

## 2015-08-25 DIAGNOSIS — R319 Hematuria, unspecified: Secondary | ICD-10-CM | POA: Diagnosis not present

## 2015-08-25 DIAGNOSIS — I10 Essential (primary) hypertension: Secondary | ICD-10-CM

## 2015-08-25 DIAGNOSIS — E785 Hyperlipidemia, unspecified: Secondary | ICD-10-CM | POA: Diagnosis not present

## 2015-08-25 DIAGNOSIS — E876 Hypokalemia: Secondary | ICD-10-CM

## 2015-08-25 NOTE — Progress Notes (Signed)
Location:  [place] Room Number: 103 Place of Service:  SNF (31) Provider: Marlowe Sax, FNP-C Blanchie Serve, MD   Antony Blackbird, MD  Patient Care Team: Antony Blackbird, MD as PCP - General (Family Medicine)  Extended Emergency Contact Information Primary Emergency Contact: Telleria,Brianna Address: [address]          [place], [place] [address] Johnnette Litter of [place] Phone: [phone] Relation: [place] Secondary Emergency Contact: [place] of [place] Mobile Phone: [phone] Relation: Grandaughter  Code Status: DNR Goals of care: Advanced Directive information Advanced Directives 08/25/2015  Does patient have an advance directive? Yes  Type of Advance Directive Out of facility DNR (pink MOST or yellow form)  Does patient want to make changes to advanced directive? No - Patient declined  Copy of advanced directive(s) in chart? Yes     Chief Complaint  Patient presents with  . Medical Management of Chronic Issues    Routine Exam    HPI:  Pt is a 82 y.o. female seen today at [facility] and Rehab for medical management of chronic diseases. She has a medical history of HTN, CVA, Afib, Hyperlipidemia among others. She is seen in her room today. She has aphasia answers yes to all questions. She states pain with movement of right hand. She continues to work with PT/OT. Right hand swelling progressive resolve with Tape. Facility reports no new concerns.    Past Medical History  Diagnosis Date  . Hypertension   . Cancer ([place])   . Asthma    Past Surgical History  Procedure Laterality Date  . Appendectomy    . Abdominal hysterectomy      PARTIAL  . Cervical spine surgery    . Myomectomy    . Breast surgery    . Ectopic pregnancy surgery    . Neck surgery      c-spine  . Radiology with anesthesia N/A 07/19/2015    Procedure: RADIOLOGY WITH ANESTHESIA;  Surgeon: Luanne Bras, MD;   Location: [place];  Service: Radiology;  Laterality: N/A;    Allergies  Allergen Reactions  . Penicillins Rash    No other information available at this time  . Aspirin Other (See Comments)    Burning in stomach  . Morphine Other (See Comments)    delusions      Medication List       This list is accurate as of: 08/25/15  8:53 AM.  Always use your most recent med list.               amLODipine 10 MG tablet  Commonly known as:  NORVASC  Take 1 tablet (10 mg total) by mouth daily.     apixaban 5 MG Tabs tablet  Commonly known as:  ELIQUIS  Take 1 tablet (5 mg total) by mouth 2 (two) times daily.     atorvastatin 10 MG tablet  Commonly known as:  LIPITOR  Take 1 tablet (10 mg total) by mouth daily at 6 PM.     diltiazem 60 MG tablet  Commonly known as:  CARDIZEM  Take 1 tablet (60 mg total) by mouth every 8 (eight) hours.     hydrALAZINE 25 MG tablet  Commonly known as:  APRESOLINE  Take 1 tablet (25 mg total) by mouth every 8 (eight) hours.     lisinopril 20 MG tablet  Commonly known as:  PRINIVIL,ZESTRIL  Take 1 tablet (20 mg total) by  mouth 2 (two) times daily.     potassium chloride 20 MEQ/15ML (10%) Soln  Take 15 mLs (20 mEq total) by mouth 2 (two) times daily.        Review of Systems  Unable to perform ROS: Other    Immunization History  Administered Date(s) Administered  . PPD Test 07/28/2015, 08/11/2015   Pertinent  Health Maintenance Due  Topic Date Due  . DEXA SCAN  04/25/1992  . PNA vac Low Risk Adult (1 of 2 - PCV13) 04/25/1992  . INFLUENZA VACCINE  11/25/2015   No flowsheet data found. Functional Status Survey:    Filed Vitals:   08/25/15 0825  BP: 116/62  Pulse: 69  Temp: 98 F (36.7 C)  Resp: 20  Height: 5\' 2"  (1.575 m)  Weight: 146 lb 12.8 oz (66.588 kg)  SpO2: 98%   Body mass index is 26.84 kg/(m^2). Physical Exam  Constitutional: She appears well-developed and well-nourished.  Aphasiac, Elderly in no acute distress.     HENT:  Head: Normocephalic.  Mouth/Throat: Oropharynx is clear and moist.  Eyes: Conjunctivae and EOM are normal. Pupils are equal, round, and reactive to light. Right eye exhibits no discharge. Left eye exhibits no discharge. No scleral icterus.  Neck: Normal range of motion.  Cardiovascular: Intact distal pulses.  Exam reveals no gallop and no friction rub.   No murmur heard. Irregular heart rate   Pulmonary/Chest: Effort normal and breath sounds normal. No respiratory distress. She has no wheezes. She has no rales.  Abdominal: Soft. Bowel sounds are normal. She exhibits no distension. There is no tenderness. There is no rebound and no guarding.  Genitourinary:  Foley Catheter draining reddish colored urine with sediments.   Musculoskeletal: She exhibits no edema.  Right Hemiparesis. Right hand pain with movement.   Lymphadenopathy:    She has no cervical adenopathy.  Neurological: She is alert.  Skin: Skin is warm and dry. No rash noted. No erythema. No pallor.  Psychiatric:  Aphasia.     Labs reviewed:  Recent Labs  07/21/15 0332  07/26/15 0407 07/27/15 0437 07/28/15 07/28/15 0237 07/31/15  NA 144  < > 142 142 143 143 144  K 4.0  < > 2.5* 3.3*  --  3.9 4.0  CL 113*  < > 106 109  --  111  --   CO2 20*  < > 28 24  --  24  --   GLUCOSE 121*  < > 112* 132*  --  144*  --   BUN 21*  < > 10 13 16 16  22*  CREATININE 0.74  < > 0.57 0.62 0.6 0.62 0.6  CALCIUM 8.7*  < > 8.2* 8.2*  --  8.6*  --   MG 2.0  --   --  1.7  --   --   --   PHOS 3.3  --   --   --   --   --   --   < > = values in this interval not displayed.  Recent Labs  07/19/15 2121 07/21/15 0332 07/31/15  AST 25  --  25  ALT 26  --  15  ALKPHOS 107  --  90  BILITOT 0.7  --   --   PROT 7.2  --   --   ALBUMIN 4.2 3.2*  --     Recent Labs  07/19/15 2121  07/20/15 0450 07/21/15 0332  07/26/15 0407 07/27/15 0437 07/28/15 07/28/15 0237 07/31/15  WBC 5.9  --  6.8 9.3  < > 6.0 7.0 7.7 7.7 5.9  NEUTROABS  2.7  --  5.4 6.4  --   --   --   --   --   --   HGB 14.4  < > 12.6 13.1  < > 14.1 13.4  --  14.0 13.8  HCT 44.0  < > 39.3 40.4  < > 41.4 41.9  --  42.1 43  MCV 94.8  --  93.6 93.7  < > 92.8 93.5  --  94.6  --   PLT 138*  --  117* 122*  < > 117* 133*  --  145* 209  < > = values in this interval not displayed. No results found for: TSH Lab Results  Component Value Date   HGBA1C 7.1* 07/20/2015   Lab Results  Component Value Date   CHOL 129 07/20/2015   HDL 48 07/20/2015   LDLCALC 74 07/20/2015   TRIG 33 07/20/2015   CHOLHDL 2.7 07/20/2015    Assessment/Plan HTN B/p stable. Continue on Amlodipine 10 mg Tablet, Hydralazine 25 mg Tablet,Diltiazem 60 mg Tablet and Lisinopril 20 mg Tablet. Monitor BMP  Hyperlipidemia  Continue on atorvastatin 10 mg Tablet. Lipid panel at target goal 07/20/2015.   Afib Continue on Apixaban 5 mg tablet and Diltiazem 60 mg Tablet for heart rate controlled.   Dysphagia Continue dysphagia diet. Continue to assist with meals. Aspiration precaution.   Hypokalemia Continue Potassium Chloride. Monitor BMP   Hematuria  Foley Catheter draining reddish colored urine with sediments. Obtain urine specimen for U/a and C/s.     Family/ staff Communication: Reviewed plan of care with facility Nurse supervisor.    Labs/tests ordered: urine specimen for U/a and C/s.

## 2015-08-27 LAB — BASIC METABOLIC PANEL
BUN: 12 mg/dL (ref 4–21)
Creatinine: 0.6 mg/dL (ref 0.5–1.1)
Glucose: 130 mg/dL
POTASSIUM: 4.2 mmol/L (ref 3.4–5.3)
SODIUM: 142 mmol/L (ref 137–147)

## 2015-08-29 ENCOUNTER — Ambulatory Visit (HOSPITAL_COMMUNITY)
Admission: RE | Admit: 2015-08-29 | Discharge: 2015-08-29 | Disposition: A | Discharge status: Home / Self Care | Payer: PPO | Source: Ambulatory Visit | Attending: Interventional Radiology | Admitting: Interventional Radiology

## 2015-08-29 DIAGNOSIS — I63032 Cerebral infarction due to thrombosis of left carotid artery: Secondary | ICD-10-CM

## 2015-09-25 ENCOUNTER — Non-Acute Institutional Stay (SKILLED_NURSING_FACILITY): Payer: PPO | Admitting: Family

## 2015-09-25 ENCOUNTER — Encounter: Payer: Self-pay | Admitting: Family

## 2015-09-25 DIAGNOSIS — I10 Essential (primary) hypertension: Secondary | ICD-10-CM | POA: Diagnosis not present

## 2015-09-25 DIAGNOSIS — R4701 Aphasia: Secondary | ICD-10-CM

## 2015-09-25 DIAGNOSIS — R1310 Dysphagia, unspecified: Secondary | ICD-10-CM

## 2015-09-25 DIAGNOSIS — E785 Hyperlipidemia, unspecified: Secondary | ICD-10-CM | POA: Diagnosis not present

## 2015-09-25 NOTE — Progress Notes (Signed)
Patient ID: Joy Holden, female   DOB: Jun 27, 1924, 82 y.o.   MRN: [id number]  Location:  [place] [place] Room Number: 103-P Place of Service:  SNF (31) Provider:  Kaydenn Mclear FNP-C   FULP, CAMMIE, MD  Patient Care Team: Antony Blackbird, MD as PCP - General (Family Medicine)  Extended Emergency Contact Information Primary Emergency Contact: Mccombie,Brianna Address: [address]          [place], [place] [address] Johnnette Litter of [place] Phone: [phone] Relation: [place] Secondary Emergency Contact: [place] of [place] Mobile Phone: [phone] Relation: Grandaughter  Code Status: DNR  Goals of care: Advanced Directive information Advanced Directives 09/25/2015  Does patient have an advance directive? Yes  Type of Advance Directive Out of facility DNR (pink MOST or yellow form)  Does patient want to make changes to advanced directive? No - Patient declined  Copy of advanced directive(s) in chart? Yes     Chief Complaint  Patient presents with  . Medical Management of Chronic Issues    HPI:  Pt is a 82 y.o. female seen today at [facility] and [facility] for medical management of chronic diseases. She has a medical history of HTN, CVA with late effects, Hyperlipidemia, afib among others. She is seen in her room today. Facility staff report no acute concerns. Unable to obtain HPI and ROS due to presence of aphasia.      Past Medical History  Diagnosis Date  . Hypertension   . Cancer ([place])   . Asthma   . Hypokalemia   . PAF (paroxysmal atrial fibrillation) ([place])   . Dysphagia   . Hematuria    Past Surgical History  Procedure Laterality Date  . Appendectomy    . Abdominal hysterectomy      PARTIAL  . Cervical spine surgery    . Myomectomy    . Breast surgery    . Ectopic pregnancy surgery    . Neck surgery      c-spine  . Radiology with anesthesia N/A 07/19/2015    Procedure: RADIOLOGY WITH  ANESTHESIA;  Surgeon: Luanne Bras, MD;  Location: [place];  Service: Radiology;  Laterality: N/A;    Allergies  Allergen Reactions  . Penicillins Rash    No other information available at this time  . Aspirin Other (See Comments)    Burning in stomach  . Morphine Other (See Comments)    delusions      Medication List       This list is accurate as of: 09/25/15  4:03 PM.  Always use your most recent med list.               amLODipine 10 MG tablet  Commonly known as:  NORVASC  Take 1 tablet (10 mg total) by mouth daily.     apixaban 5 MG Tabs tablet  Commonly known as:  ELIQUIS  Take 1 tablet (5 mg total) by mouth 2 (two) times daily.     atorvastatin 10 MG tablet  Commonly known as:  LIPITOR  Take 1 tablet (10 mg total) by mouth daily at 6 PM.     bethanechol 25 MG tablet  Commonly known as:  URECHOLINE  Take 25 mg by mouth every 8 (eight) hours.     DECUBI-VITE Caps  Take 1 capsule by mouth daily.     diltiazem 60 MG tablet  Commonly known as:  CARDIZEM  Take 1 tablet (60 mg  total) by mouth every 8 (eight) hours.     hydrALAZINE 25 MG tablet  Commonly known as:  APRESOLINE  Take 1 tablet (25 mg total) by mouth every 8 (eight) hours.     lisinopril 20 MG tablet  Commonly known as:  PRINIVIL,ZESTRIL  Take 1 tablet (20 mg total) by mouth 2 (two) times daily.     potassium chloride 20 MEQ/15ML (10%) Soln  Take 15 mLs (20 mEq total) by mouth 2 (two) times daily.        Review of Systems  Unable to perform ROS: Other    Immunization History  Administered Date(s) Administered  . PPD Test 07/28/2015, 08/11/2015   Pertinent  Health Maintenance Due  Topic Date Due  . DEXA SCAN  04/25/1992  . PNA vac Low Risk Adult (1 of 2 - PCV13) 04/25/1992  . INFLUENZA VACCINE  11/25/2015   No flowsheet data found. Functional Status Survey:    Filed Vitals:   09/25/15 0915  BP: 125/73  Pulse: 90  Temp: 97 F (36.1 C)  TempSrc: Oral  Resp: 18  Height: 5'  2" (1.575 m)  Weight: 151 lb 6.4 oz (68.675 kg)  SpO2: 96%   Body mass index is 27.68 kg/(m^2). Physical Exam  Constitutional: She appears well-developed and well-nourished. No distress.  HENT:  Head: Normocephalic.  Mouth/Throat: Oropharynx is clear and moist. No oropharyngeal exudate.  Eyes: Conjunctivae and EOM are normal. Pupils are equal, round, and reactive to light. Right eye exhibits no discharge. Left eye exhibits no discharge. No scleral icterus.  Neck: Normal range of motion. Neck supple. No JVD present. No thyromegaly present.  Cardiovascular: Normal rate, regular rhythm, normal heart sounds and intact distal pulses.  Exam reveals no gallop and no friction rub.   No murmur heard. Pulmonary/Chest: Effort normal and breath sounds normal. No respiratory distress. She has no wheezes. She has no rales.  Abdominal: Soft. Bowel sounds are normal. She exhibits no distension. There is no tenderness. There is no rebound and no guarding.  Genitourinary:  Incontinent for B/B   Musculoskeletal: She exhibits no edema or tenderness.  Left side weakness  Lymphadenopathy:    She has no cervical adenopathy.  Neurological: She is alert.  Skin: Skin is warm and dry. No rash noted. No erythema. No pallor.  Psychiatric: She has a normal mood and affect.    Labs reviewed:  Recent Labs  07/21/15 0332  07/26/15 0407 07/27/15 0437  07/28/15 0237 07/31/15 08/27/15  NA 144  < > 142 142  < > 143 144 142  K 4.0  < > 2.5* 3.3*  --  3.9 4.0 4.2  CL 113*  < > 106 109  --  111  --   --   CO2 20*  < > 28 24  --  24  --   --   GLUCOSE 121*  < > 112* 132*  --  144*  --   --   BUN 21*  < > 10 13  < > 16 22* 12  CREATININE 0.74  < > 0.57 0.62  < > 0.62 0.6 0.6  CALCIUM 8.7*  < > 8.2* 8.2*  --  8.6*  --   --   MG 2.0  --   --  1.7  --   --   --   --   PHOS 3.3  --   --   --   --   --   --   --   < > =  values in this interval not displayed.  Recent Labs  07/19/15 2121 07/21/15 0332 07/31/15    AST 25  --  25  ALT 26  --  15  ALKPHOS 107  --  90  BILITOT 0.7  --   --   PROT 7.2  --   --   ALBUMIN 4.2 3.2*  --     Recent Labs  07/19/15 2121  07/20/15 0450 07/21/15 0332  07/26/15 0407 07/27/15 0437 07/28/15 07/28/15 0237 07/31/15  WBC 5.9  --  6.8 9.3  < > 6.0 7.0 7.7 7.7 5.9  NEUTROABS 2.7  --  5.4 6.4  --   --   --   --   --   --   HGB 14.4  < > 12.6 13.1  < > 14.1 13.4  --  14.0 13.8  HCT 44.0  < > 39.3 40.4  < > 41.4 41.9  --  42.1 43  MCV 94.8  --  93.6 93.7  < > 92.8 93.5  --  94.6  --   PLT 138*  --  117* 122*  < > 117* 133*  --  145* 209  < > = values in this interval not displayed. No results found for: TSH Lab Results  Component Value Date   HGBA1C 7.1* 07/20/2015   Lab Results  Component Value Date   CHOL 129 07/20/2015   HDL 48 07/20/2015   LDLCALC 74 07/20/2015   TRIG 33 07/20/2015   CHOLHDL 2.7 07/20/2015    Significant Diagnostic Results in last 30 days:  Ir Radiologist Eval & Mgmt  09/05/2015  EXAM: ESTABLISHED PATIENT OFFICE VISIT CHIEF COMPLAINT: Expressive aphasia.  Right-sided paralysis. Current Pain Level: 1-10 HISTORY OF PRESENT ILLNESS: The patient is an 82year-old, right-handed lady who is status post endovascular near complete revascularization of occluded left middle cerebral artery with mechanical thrombectomy on 07/19/2015. She was then discharged to a rehab facility. She returns accompanied by her daughter-in-law for follow-up. This would be her four week post stroke follow-up. According to her daughter-in-law, there has been significant improvement in her speech whereby she is not able to verbalize 2 or 3 phrases. She was completely aphasic right after the procedure. Comprehension is fully intact. She does appear frustrated being unable to express whatever she wants to say. At the rehab center, the patient is undergoing daily physical therapy, occupational therapy and speech therapy. The patient's daughter-in-law and patient reports no  motor activity involving the right upper and right lower extremities. However, she does have intolerance to touch which induces painful dysesthesias involving her right upper extremity. However the patient is able to eat fairly freely now. There are no difficulties in swallowing. She has no significant visual symptoms per se. There have been no reported seizures or loss of consciousness. The patient is able to feed herself. However needs help with abating and dressing. As mentioned above, she is in a rehab center undergoing extensive physical therapy. Past medical history of DCIS. Essential hypertension, ischemic stroke as mentioned above. Paroxysmal atrial fibrillation. Hyperlipidemia. Dysphagia. UTIs in the past, hematuria, hypokalemia and thrombocytopenia. Present medications are amlodipine, Eliquis, Lipitor, Cardizem, Apresoline, Zestril and potassium chloride. Her allergies are penicillin which causes rash. Morphine which causes nausea, vomiting. The patient does not smoke, does not drink alcohol or use illicit chemicals. Review of systems otherwise negative unless as mentioned above. PHYSICAL EXAMINATION: Appears her stated. knows what she wants to say however is unable to do so. There is obvious  frustration. The patient has no significant facial weakness. However has practically no significant mobility in the right upper and right lower extremities. Clinically also appears to have right/left disorientation. ASSESSMENT AND PLAN: Plan is for patient to continue with her above medications and to keep her appointment with her neurologist. The patient's immediate post treatment MRI scan was reviewed with the patient's daughter-in-law. Patient will be seen on a p.r.n. basis. Electronically Signed   By: Luanne Bras M.D.   On: 09/01/2015 08:54    Assessment/Plan 1. Essential hypertension B/p stable. Continue on Amlodipine 10 mg Tablet, Hydralazine 25 mg Tablet, Lisinopril 20 mg tablet and diltiazem 60 mg  Tablet. Monitor BMP   2. Dysphagia Continue on current diet. Aspiration precautions.   3. HLD (hyperlipidemia) Continue Atorvastatin 10 mg Tablet. Lipid panel 09/26/2015  4. Aphasia Continue with speech therapy.     Family/ staff Communication: Reviewed plan of care with patient and facility Nurse supervisor.  Labs/tests ordered: Hgb A1C TSH, Lipid panel 09/26/2015

## 2015-09-26 LAB — TSH: TSH: 1.24 u[IU]/mL (ref <=5.90)

## 2015-09-26 LAB — HEMOGLOBIN A1C: HEMOGLOBIN A1C: 7.3

## 2015-09-30 ENCOUNTER — Encounter: Payer: Self-pay | Admitting: Neurology

## 2015-09-30 ENCOUNTER — Ambulatory Visit (INDEPENDENT_AMBULATORY_CARE_PROVIDER_SITE_OTHER): Payer: PPO | Admitting: Neurology

## 2015-09-30 VITALS — BP 123/78 | HR 78 | Ht 65.0 in

## 2015-09-30 DIAGNOSIS — F329 Major depressive disorder, single episode, unspecified: Secondary | ICD-10-CM | POA: Diagnosis not present

## 2015-09-30 DIAGNOSIS — G8110 Spastic hemiplegia affecting unspecified side: Secondary | ICD-10-CM | POA: Diagnosis not present

## 2015-09-30 DIAGNOSIS — IMO0002 Reserved for concepts with insufficient information to code with codable children: Secondary | ICD-10-CM | POA: Insufficient documentation

## 2015-09-30 DIAGNOSIS — I69320 Aphasia following cerebral infarction: Secondary | ICD-10-CM | POA: Diagnosis not present

## 2015-09-30 DIAGNOSIS — F32A Depression, unspecified: Secondary | ICD-10-CM

## 2015-09-30 MED ORDER — BACLOFEN 10 MG PO TABS: 5.0000 mg | ORAL_TABLET | Freq: Three times a day (TID) | ORAL | Status: DC

## 2015-09-30 MED ORDER — BUPROPION HCL ER (SR) 100 MG PO TB12: 100.0000 mg | ORAL_TABLET | Freq: Every day | ORAL | Status: DC

## 2015-09-30 NOTE — Progress Notes (Signed)
[facility] [address] [place]. [place] [address] [phone]       OFFICE FOLLOW-UP NOTE  Ms. Joy Holden Date of Birth:  06/15/1924 Medical Record Number:  [id number]   HPI: 82 year african american lady seen today for the first office follow-up visit for in-hospital admission for stroke in March 2017. She is accompanied by her daughter-in-law. History is obtained through review of hospital chart and speaking to her daughter-in-law DANIJAH AVE is a 82 y.o. female history of atrial fibrillation and hypertension brought to the emergency room following acute onset of speech abnormality, right facial droop and right hemiplegia. Patient was driving at the time of onset of her deficits. She was last known well at about 8:15 PM on 07/19/2015. She has no previous of stroke nor TIA. She has not been on anticoagulation or antiplatelet therapy. She reportedly is allergic to aspirin. CT scan of her head showed no acute intracranial abnormality. CT angiogram however showed occlusion of left ICA terminus with thrombus extending from the level of posterior indicating artery both M1 and A1 segments. Patient was treated with venous TPA with no change in deficits. Her NIH stroke score was 26. She was subsequently taken to interventional radiology suite for further management. Blood pressure was elevated, requiring intervention with hydralazine IV. LSN: 8:15 PM on 07/19/2015 tPA Given: Yes . She had emergent arteriogram,followed by complete revascularization of T occlusion of LT ICA ,LT MCA and Lt ACA with x 1pass with Solitaire 4 mm x 40 mm retrieval device and 5 mg of superselective IA TPA with TICI 2b revascularization by Dr. Estanislado Pandy. She was admitted to the intensive care unit initially intubated and blood pressure was tightly controlled. Follow-up imaging showed large left anterior cerebral artery and smaller left basal ganglia and frontal MCA branch infarcts on MRI scan with mild  cytotoxic edema and 3-4 mm left to right midline shift. She was treated conservatively and gradually extubated. She needed help with swallowing initially with a panda tube but subsequently was found to be able to swallow. She was mildly aphasic but and dense right hemiplegia. LDL cholesterol was 7 from percent. Transthoracic echo showed normal ejection fraction. She was found to be negative fibrillation. She was started on eliquis when she was able to swallow at the time of discharge. She was transferred to skilled nursing facility where she is currently staying. Is getting ongoing physical occupational speech therapy. She is able to communicate now with the moderate expressive aphasia with mild compression difficulties. She however has not regained any strength on the right side. She has full assist and spends most of the time in a wheelchair. She is unable to even stand up and walk even with physical therapist's assistance. She has indwelling Foley catheter. She is on eliquis and tolerating well without bleeding or bruising. Her daughter-in-law feels that she is depressed that she often crying and will not cooperate for the therapist. She complains of pain when the therapist tried to work on right side which is now become quite stiff. ROS:   14 system review of systems is positive for speech difficulty, trouble and standing, gait difficulty, weakness, pain  PMH:  Past Medical History  Diagnosis Date  . Hypertension   . Cancer ([place])   . Asthma   . Hypokalemia   . PAF (paroxysmal atrial fibrillation) ([place])   . Dysphagia   . Hematuria   . Stroke [facility])     Social History:  Social History  Social History  . Marital Status: Widowed    Spouse Name: N/A  . Number of Children: N/A  . Years of Education: N/A   Occupational History  . Not on file.   Social History Main Topics  . Smoking status: Never Smoker   . Smokeless tobacco: Never Used  . Alcohol Use: No  . Drug Use: No  . Sexual  Activity: Not on file   Other Topics Concern  . Not on file   Social History Narrative    Medications:   Current Outpatient Prescriptions on File Prior to Visit  Medication Sig Dispense Refill  . amLODipine (NORVASC) 10 MG tablet Take 1 tablet (10 mg total) by mouth daily. 30 tablet 2  . apixaban (ELIQUIS) 5 MG TABS tablet Take 1 tablet (5 mg total) by mouth 2 (two) times daily. 60 tablet 2  . atorvastatin (LIPITOR) 10 MG tablet Take 1 tablet (10 mg total) by mouth daily at 6 PM. 30 tablet 2  . bethanechol (URECHOLINE) 25 MG tablet Take 25 mg by mouth every 8 (eight) hours.    Marland Kitchen diltiazem (CARDIZEM) 60 MG tablet Take 1 tablet (60 mg total) by mouth every 8 (eight) hours. 90 tablet 2  . hydrALAZINE (APRESOLINE) 25 MG tablet Take 1 tablet (25 mg total) by mouth every 8 (eight) hours. 90 tablet 2  . lisinopril (PRINIVIL,ZESTRIL) 20 MG tablet Take 1 tablet (20 mg total) by mouth 2 (two) times daily. 60 tablet 2  . Multiple Vitamins-Minerals (DECUBI-VITE) CAPS Take 1 capsule by mouth daily.     . potassium chloride 20 MEQ/15ML (10%) SOLN Take 15 mLs (20 mEq total) by mouth 2 (two) times daily. 450 mL 0   No current facility-administered medications on file prior to visit.    Allergies:   Allergies  Allergen Reactions  . Penicillins Rash    No other information available at this time  . Aspirin Other (See Comments)    Burning in stomach  . Morphine Other (See Comments)    delusions    Physical Exam General: well developed, well nourished Elderly African-American lady sitting in a wheelchair, seated, in no evident distress Head: head normocephalic and atraumatic.  Neck: supple with no carotid or supraclavicular bruits Cardiovascular: regular rate and rhythm, no murmurs Musculoskeletal: no deformity Skin:  no rash/petichiae Vascular:  Normal pulses all extremities She has a indwelling Foley catheter. Filed Vitals:   09/30/15 1303  BP: 123/78  Pulse: 78   Neurologic  Exam Mental Status: Awake and fully alert. Moderate expressive and mild receptive aphasia with significant word finding difficulties and disfluent speech. She can be understood with some difficulties and can speak short sentences at times clearly. Diminished attention. Good repetition. Poor naming. Mild dysarthria.  Cranial Nerves: Fundoscopic exam not done Pupils equal, briskly reactive to light. Extraocular movements full without nystagmus. Visual fields decreased blink to threat on the right compared to the left.Marland Kitchen Hearing intact. Facial sensation intact. Face, tongue, palate moves normally and symmetrically.  Motor: Spastic right hemiplegia with 0/5 strength on the right with non-fixed flexion contracture at the right wrist and hand and increased tone on the right side throughout. Right foot drop Normal strength on the left. Sensory.: intact to touch ,pinprick .position and vibratory sensation.  Coordination: Rapid alternating movements normal in left extremities and unable to test on the right. Gait and Station: Not tested as patient is unable to even stand and walk with therapist. She is in a beachchair  Reflexes: 2+ and  asymmetric brisker on the right side. Toes downgoing.   NIHSS  12 Modified Rankin  4   ASSESSMENT: 73 year African-American lady with embolic left MCA infarct in March 2017 secondary to terminal left ICA occlusion from atrial fibrillation treated with mechanical embolectomy and intra-arterial TPA with the revascularization. Unfortunately patient continues to have significant residual aphasia and spastic right hemiplegia. She also has poststroke spasticity which limits her ability to participate in therapies and post stroke depression    PLAN: I had a long d/w patient and daughter in law about her recent stroke,atrial fibrillation, risk for recurrent stroke/TIAs, personally independently reviewed imaging studies and stroke evaluation results and answered questions.Continue  Eliquis (apixaban) daily  for secondary stroke prevention and maintain strict control of hypertension with blood pressure goal below 130/90, diabetes with hemoglobin A1c goal below 6.5% and lipids with LDL cholesterol goal below 70 mg/dL. I encouraged patient to continue participation in ongoing physical occupational and speech therapy. Trial of baclofen for spasticity. Begin 5 mg 3 times daily for 2 weeks then increase as tolerated to 10 mg 3 times daily and further. Trial of Wellbutrin SR 100 mg daily for post stroke depression. Followup in the future with me only as necessary.Greater than 50% of time during this 25 minute visit was spent on counseling,explanation of diagnosis, planning of further management, discussion with patient and family and coordination of care Antony Contras, MD  [facility] [address] [place], [place] [address]  Phone [phone] Fax [phone] Note: This document was prepared with digital dictation and possible smart phrase technology. Any transcriptional errors that result from this process are unintentional

## 2015-09-30 NOTE — Patient Instructions (Signed)
I had a long d/w patient and daughter in law about her recent stroke,atrial fibrillation, risk for recurrent stroke/TIAs, personally independently reviewed imaging studies and stroke evaluation results and answered questions.Continue Eliquis (apixaban) daily  for secondary stroke prevention and maintain strict control of hypertension with blood pressure goal below 130/90, diabetes with hemoglobin A1c goal below 6.5% and lipids with LDL cholesterol goal below 70 mg/dL. I encouraged patient to continue participation in ongoing physical occupational and speech therapy. Trial of baclofen for spasticity. Begin 5 mg 3 times daily for 2 weeks then increase as tolerated to 10 mg 3 times daily and further. Trial of Wellbutrin SR 100 mg daily for post stroke depression. Followup in the future with me only as necessary.

## 2015-10-10 LAB — HEPATIC FUNCTION PANEL
ALK PHOS: 109 U/L (ref 25–125)
ALT: 12 U/L (ref 7–35)
AST: 12 U/L — AB (ref 13–35)
Bilirubin, Total: 0.5 mg/dL

## 2015-10-10 LAB — LIPID PANEL
Cholesterol: 117 mg/dL (ref 0–200)
HDL: 50 mg/dL (ref 35–70)
LDL Cholesterol: 56 mg/dL
Triglycerides: 57 mg/dL (ref 40–160)

## 2015-10-31 ENCOUNTER — Other Ambulatory Visit: Payer: Self-pay

## 2015-10-31 NOTE — Patient Outreach (Signed)
Outreach attempt made to perform 90 day Modified Rankin Survey; HIPAA compliant voicemail left requesting callback. Kenney Houseman, [facility] [phone]

## 2015-11-05 ENCOUNTER — Encounter: Payer: Self-pay | Admitting: Family

## 2015-11-05 ENCOUNTER — Non-Acute Institutional Stay (SKILLED_NURSING_FACILITY): Payer: PPO | Admitting: Family

## 2015-11-05 DIAGNOSIS — I480 Paroxysmal atrial fibrillation: Secondary | ICD-10-CM | POA: Diagnosis not present

## 2015-11-05 DIAGNOSIS — E876 Hypokalemia: Secondary | ICD-10-CM | POA: Diagnosis not present

## 2015-11-05 DIAGNOSIS — I10 Essential (primary) hypertension: Secondary | ICD-10-CM

## 2015-11-05 DIAGNOSIS — F329 Major depressive disorder, single episode, unspecified: Secondary | ICD-10-CM

## 2015-11-05 DIAGNOSIS — F32A Depression, unspecified: Secondary | ICD-10-CM

## 2015-11-05 DIAGNOSIS — E785 Hyperlipidemia, unspecified: Secondary | ICD-10-CM

## 2015-11-05 NOTE — Progress Notes (Signed)
Location:   [place] Room Number: 301-B Place of Service:  SNF (31) Provider:  Kierstyn Baranowski,  Kaeleigh Westendorf FNP-C  FULP, CAMMIE, MD  Patient Care Team: Antony Blackbird, MD as PCP - General (Family Medicine)  Extended Emergency Contact Information Primary Emergency Contact: Lacomb,Brianna Address: [address]          [place], [place] [address] [place] Phone: [phone] Relation: [place] Secondary Emergency Contact: Charlyne Mom [place] Mobile Phone: [phone] Relation: Grandaughter  Code Status:  DNR Goals of care: Advanced Directive information Advanced Directives 11/05/2015  Does patient have an advance directive? Yes  Type of Advance Directive Out of facility DNR (pink MOST or yellow form)  Does patient want to make changes to advanced directive? No - Patient declined  Copy of advanced directive(s) in chart? Yes     Chief Complaint  Patient presents with  . Medical Management of Chronic Issues    HPI:  Pt is a 82 y.o. female seen today at [facility]  for medical management of chronic diseases.She has a significant medical history of  HTN, Afib, Hyperlipidemia, Depression, stroke among other conditions.  She is seen in her room today. She denies any acute issues this visit. She continues to require total care assistance with ADL's.Has had no recent fall episodes or hospital addmission.No skin breakdown. Facility staff reports no new concerns.    Past Medical History  Diagnosis Date  . Hypertension   . Cancer ([place])   . Asthma   . Hypokalemia   . PAF (paroxysmal atrial fibrillation) ([place])   . Dysphagia   . Hematuria   . Stroke [facility])    Past Surgical History  Procedure Laterality Date  . Appendectomy    . Abdominal hysterectomy      PARTIAL  . Cervical spine surgery    . Myomectomy    . Breast surgery    . Ectopic pregnancy surgery    . Neck surgery      c-spine   . Radiology with anesthesia N/A 07/19/2015    Procedure: RADIOLOGY WITH ANESTHESIA;  Surgeon: Luanne Bras, MD;  Location: [place];  Service: Radiology;  Laterality: N/A;    Allergies  Allergen Reactions  . Penicillins Rash    No other information available at this time  . Aspirin Other (See Comments)    Burning in stomach  . Morphine Other (See Comments)    delusions      Medication List       This list is accurate as of: 11/05/15  4:04 PM.  Always use your most recent med list.               amLODipine 10 MG tablet  Commonly known as:  NORVASC  Take 1 tablet (10 mg total) by mouth daily.     apixaban 5 MG Tabs tablet  Commonly known as:  ELIQUIS  Take 1 tablet (5 mg total) by mouth 2 (two) times daily.     atorvastatin 10 MG tablet  Commonly known as:  LIPITOR  Take 1 tablet (10 mg total) by mouth daily at 6 PM.     baclofen 10 MG tablet  Commonly known as:  LIORESAL  Take 0.5 tablets (5 mg total) by mouth 3 (three) times daily. Start 0.5 tablets three times daily x 2 weeks then 1 tablet three times daily as tolerated     bethanechol 25 MG tablet  Commonly known as:  URECHOLINE  Take 25 mg by mouth every 8 (eight) hours.     buPROPion 100 MG 12 hr tablet  Commonly known as:  WELLBUTRIN SR  Take 1 tablet (100 mg total) by mouth daily.     DECUBI-VITE Caps  Take 1 capsule by mouth daily. For wound healing     diltiazem 60 MG tablet  Commonly known as:  CARDIZEM  Take 1 tablet (60 mg total) by mouth every 8 (eight) hours.     hydrALAZINE 25 MG tablet  Commonly known as:  APRESOLINE  Take 1 tablet (25 mg total) by mouth every 8 (eight) hours.     lisinopril 20 MG tablet  Commonly known as:  PRINIVIL,ZESTRIL  Take 1 tablet (20 mg total) by mouth 2 (two) times daily.     potassium chloride 20 MEQ/15ML (10%) Soln  Take 15 mLs (20 mEq total) by mouth 2 (two) times daily.        Review of Systems  Unable to perform ROS: Other    Immunization  History  Administered Date(s) Administered  . PPD Test 07/28/2015, 08/11/2015   Pertinent  Health Maintenance Due  Topic Date Due  . DEXA SCAN  04/25/1992  . PNA vac Low Risk Adult (1 of 2 - PCV13) 04/25/1992  . INFLUENZA VACCINE  11/25/2015   Fall Risk  09/30/2015  Falls in the past year? No   Functional Status Survey:    Filed Vitals:   11/05/15 1529  BP: 111/58  Pulse: 69  Temp: 96.7 F (35.9 C)  Resp: 20  Height: 5\' 2"  (1.575 m)  Weight: 151 lb 6.4 oz (68.675 kg)  SpO2: 96%   Body mass index is 27.68 kg/(m^2). Physical Exam  Constitutional: She appears well-developed and well-nourished. No distress.  Elderly   HENT:  Head: Normocephalic.  Mouth/Throat: Oropharynx is clear and moist. No oropharyngeal exudate.  Eyes: Conjunctivae and EOM are normal. Pupils are equal, round, and reactive to light. Right eye exhibits no discharge. Left eye exhibits no discharge. No scleral icterus.  Neck: Normal range of motion. Neck supple. No JVD present. No thyromegaly present.  Cardiovascular: Normal rate, regular rhythm, normal heart sounds and intact distal pulses.  Exam reveals no gallop and no friction rub.   No murmur heard. Pulmonary/Chest: Effort normal and breath sounds normal. No respiratory distress. She has no wheezes. She has no rales.  Abdominal: Soft. Bowel sounds are normal. She exhibits no distension. There is no tenderness. There is no rebound and no guarding.  Genitourinary:  Genitourinary Comments: Incontinent for B/B   Musculoskeletal: She exhibits no edema or tenderness.  Left side weakness  Lymphadenopathy:    She has no cervical adenopathy.  Neurological: She is alert.  Skin: Skin is warm and dry. No rash noted. No erythema. No pallor.  Psychiatric: She has a normal mood and affect.    Labs reviewed:  Recent Labs  07/21/15 0332  07/26/15 0407 07/27/15 0437  07/28/15 0237 07/31/15 08/27/15  NA 144  < > 142 142  < > 143 144 142  K 4.0  < > 2.5* 3.3*   --  3.9 4.0 4.2  CL 113*  < > 106 109  --  111  --   --   CO2 20*  < > 28 24  --  24  --   --   GLUCOSE 121*  < > 112* 132*  --  144*  --   --   BUN  21*  < > 10 13  < > 16 22* 12  CREATININE 0.74  < > 0.57 0.62  < > 0.62 0.6 0.6  CALCIUM 8.7*  < > 8.2* 8.2*  --  8.6*  --   --   MG 2.0  --   --  1.7  --   --   --   --   PHOS 3.3  --   --   --   --   --   --   --   < > = values in this interval not displayed.  Recent Labs  07/19/15 2121 07/21/15 0332 07/31/15 10/10/15  AST 25  --  25 12*  ALT 26  --  15 12  ALKPHOS 107  --  90 109  BILITOT 0.7  --   --   --   PROT 7.2  --   --   --   ALBUMIN 4.2 3.2*  --   --     Recent Labs  07/19/15 2121  07/20/15 0450 07/21/15 0332  07/26/15 0407 07/27/15 0437 07/28/15 07/28/15 0237 07/31/15  WBC 5.9  --  6.8 9.3  < > 6.0 7.0 7.7 7.7 5.9  NEUTROABS 2.7  --  5.4 6.4  --   --   --   --   --   --   HGB 14.4  < > 12.6 13.1  < > 14.1 13.4  --  14.0 13.8  HCT 44.0  < > 39.3 40.4  < > 41.4 41.9  --  42.1 43  MCV 94.8  --  93.6 93.7  < > 92.8 93.5  --  94.6  --   PLT 138*  --  117* 122*  < > 117* 133*  --  145* 209  < > = values in this interval not displayed. Lab Results  Component Value Date   TSH 1.24 09/26/2015   Lab Results  Component Value Date   HGBA1C 7.3 09/26/2015   Lab Results  Component Value Date   CHOL 117 10/10/2015   HDL 50 10/10/2015   LDLCALC 56 10/10/2015   TRIG 57 10/10/2015   CHOLHDL 2.7 07/20/2015    Significant Diagnostic Results in last 30 days:  No results found.  Assessment/Plan HTN B/p stable. Continue on Amlodipine 10 mg tablet, Lisinopril 20 mg Tablet, Diltiazem 60 mg Tablet and Hydralazine 25 mg tablet. Monitor BMP   Afib  HR controlled. Continue on Eliquis 5 mg Tablet. Continue to monitor.  Hyperlipidemia  Continue on Atorvastatin 10 mg Tablet. Monitor lipid panel every 3 to 6 months.   Hypokalemia  Continue on Potassium chloride 20 meq Soln. Monitor BMP  Depression Stable. Continue on  Bupropion 100 mg Tablet. Monitor for mood changes.   Family/ staff Communication: Reviewed plan of care with facility Nurse Supervisor.   Labs/tests ordered: None

## 2015-11-07 ENCOUNTER — Other Ambulatory Visit: Payer: Self-pay

## 2015-11-09 DIAGNOSIS — F329 Major depressive disorder, single episode, unspecified: Secondary | ICD-10-CM | POA: Insufficient documentation

## 2015-11-25 ENCOUNTER — Non-Acute Institutional Stay (SKILLED_NURSING_FACILITY): Payer: PPO | Admitting: Family

## 2015-11-25 DIAGNOSIS — R000 Tachycardia, unspecified: Secondary | ICD-10-CM | POA: Diagnosis not present

## 2015-11-25 DIAGNOSIS — Z9289 Personal history of other medical treatment: Secondary | ICD-10-CM

## 2015-11-25 DIAGNOSIS — M79641 Pain in right hand: Secondary | ICD-10-CM

## 2015-11-25 DIAGNOSIS — Z960 Presence of urogenital implants: Secondary | ICD-10-CM

## 2015-11-25 DIAGNOSIS — R1310 Dysphagia, unspecified: Secondary | ICD-10-CM

## 2015-11-25 DIAGNOSIS — Z978 Presence of other specified devices: Secondary | ICD-10-CM

## 2015-11-25 NOTE — Progress Notes (Addendum)
Location:   [place] of Service:  SNF (31) Provider:  Dinah Ngetich FNP-C   FULP, CAMMIE, MD  Patient Care Team: Antony Blackbird, MD as PCP - General (Family Medicine)  Extended Emergency Contact Information Primary Emergency Contact: Duffell,Brianna Address: [address]          [place], [place] [address] Johnnette Litter of [place] Phone: [phone] Relation: [place] Secondary Emergency Contact: [place] of [place] Mobile Phone: [phone] Relation: Grandaughter  Code Status:  DNR  Goals of care: Advanced Directive information Advanced Directives 11/05/2015  Does patient have an advance directive? Yes  Type of Advance Directive Out of facility DNR (pink MOST or yellow form)  Does patient want to make changes to advanced directive? No - Patient declined  Copy of advanced directive(s) in chart? Yes     Chief Complaint  Patient presents with  . Acute Visit    HPI:  Pt is a 82 y.o. female seen today at [facility] for an acute visit for evaluation of holding medication in the mouth, not talking or eating per facility nurse report. She is seen in the room asleep in bed. She shakes her right leg and grimaces when her right hand is touched.She is unable to provide HPI and ROS information though whispered "yes" when asked if right hand is painful. Afebrile. No cough noted.   Addendum:  Patient assisted up on the chair for meals. Facility Nurse reports patient's Vital signs Temp 98.0, Pulse 131, Resp 20, B/P 140/74    Past Medical History:  Diagnosis Date  . Asthma   . Cancer ([place])   . Dysphagia   . Hematuria   . Hypertension   . Hypokalemia   . PAF (paroxysmal atrial fibrillation) ([place])   . Stroke [facility])    Past Surgical History:  Procedure Laterality Date  . ABDOMINAL HYSTERECTOMY     PARTIAL  . APPENDECTOMY    . BREAST SURGERY    . CERVICAL SPINE SURGERY    . ECTOPIC PREGNANCY  SURGERY    . MYOMECTOMY    . NECK SURGERY     c-spine  . RADIOLOGY WITH ANESTHESIA N/A 07/19/2015   Procedure: RADIOLOGY WITH ANESTHESIA;  Surgeon: Luanne Bras, MD;  Location: [place];  Service: Radiology;  Laterality: N/A;    Allergies  Allergen Reactions  . Penicillins Rash    No other information available at this time  . Aspirin Other (See Comments)    Burning in stomach  . Morphine Other (See Comments)    delusions      Medication List       Accurate as of 11/25/15 12:31 PM. Always use your most recent med list.          amLODipine 10 MG tablet Commonly known as:  NORVASC Take 1 tablet (10 mg total) by mouth daily.   apixaban 5 MG Tabs tablet Commonly known as:  ELIQUIS Take 1 tablet (5 mg total) by mouth 2 (two) times daily.   atorvastatin 10 MG tablet Commonly known as:  LIPITOR Take 1 tablet (10 mg total) by mouth daily at 6 PM.   baclofen 10 MG tablet Commonly known as:  LIORESAL Take 0.5 tablets (5 mg total) by mouth 3 (three) times daily. Start 0.5 tablets three times daily x 2 weeks then 1 tablet three times daily as tolerated   bethanechol 25 MG tablet Commonly known as:  URECHOLINE Take  25 mg by mouth every 8 (eight) hours.   buPROPion 100 MG 12 hr tablet Commonly known as:  WELLBUTRIN SR Take 1 tablet (100 mg total) by mouth daily.   DECUBI-VITE Caps Take 1 capsule by mouth daily. For wound healing   diltiazem 60 MG tablet Commonly known as:  CARDIZEM Take 1 tablet (60 mg total) by mouth every 8 (eight) hours.   hydrALAZINE 25 MG tablet Commonly known as:  APRESOLINE Take 1 tablet (25 mg total) by mouth every 8 (eight) hours.   lisinopril 20 MG tablet Commonly known as:  PRINIVIL,ZESTRIL Take 1 tablet (20 mg total) by mouth 2 (two) times daily.   potassium chloride 20 MEQ/15ML (10%) Soln Take 15 mLs (20 mEq total) by mouth 2 (two) times daily.       Review of Systems  Unable to perform ROS: Other    Immunization History    Administered Date(s) Administered  . PPD Test 07/28/2015, 08/11/2015   Pertinent  Health Maintenance Due  Topic Date Due  . DEXA SCAN  04/25/1992  . PNA vac Low Risk Adult (1 of 2 - PCV13) 04/25/1992  . INFLUENZA VACCINE  01/25/2016 (Originally 11/25/2015)   Fall Risk  09/30/2015  Falls in the past year? No   Functional Status Survey:    Vitals:   11/25/15 1156  BP: 101/71  Pulse: 78  Resp: 18  Temp: 98 F (36.7 C)  SpO2: 97%  Weight: 151 lb (68.5 kg)  Height: 5\' 2"  (1.575 m)   Body mass index is 27.62 kg/m. Physical Exam  Constitutional: She appears well-developed and well-nourished. No distress.  Elderly Holding saliva in the mouth   HENT:  Head: Normocephalic.  Mouth/Throat: Oropharynx is clear and moist. No oropharyngeal exudate.  Eyes: Conjunctivae and EOM are normal. Pupils are equal, round, and reactive to light. Right eye exhibits no discharge. Left eye exhibits no discharge. No scleral icterus.  Neck: Normal range of motion. Neck supple. No JVD present. No thyromegaly present.  Cardiovascular: Normal rate, regular rhythm, normal heart sounds and intact distal pulses.  Exam reveals no gallop and no friction rub.   No murmur heard. Pulmonary/Chest: Effort normal and breath sounds normal. No respiratory distress. She has no wheezes. She has no rales.  Abdominal: Soft. Bowel sounds are normal. She exhibits no distension. There is no tenderness. There is no rebound and no guarding.  Genitourinary:  Genitourinary Comments: Foley Catheter   Musculoskeletal: She exhibits no edema or tenderness.  Right side hemiparalysis. Grimaces and shakes right leg with movement of right hand.   Lymphadenopathy:    She has no cervical adenopathy.  Neurological: She is alert.  Skin: Skin is warm and dry. No rash noted. No erythema. No pallor.  Psychiatric: She has a normal mood and affect.    Labs reviewed:  Recent Labs  07/21/15 0332  07/26/15 0407 07/27/15 0437   07/28/15 0237 07/31/15 08/27/15  NA 144  < > 142 142  < > 143 144 142  K 4.0  < > 2.5* 3.3*  --  3.9 4.0 4.2  CL 113*  < > 106 109  --  111  --   --   CO2 20*  < > 28 24  --  24  --   --   GLUCOSE 121*  < > 112* 132*  --  144*  --   --   BUN 21*  < > 10 13  < > 16 22* 12  CREATININE 0.74  < >  0.57 0.62  < > 0.62 0.6 0.6  CALCIUM 8.7*  < > 8.2* 8.2*  --  8.6*  --   --   MG 2.0  --   --  1.7  --   --   --   --   PHOS 3.3  --   --   --   --   --   --   --   < > = values in this interval not displayed.  Recent Labs  07/19/15 2121 07/21/15 0332 07/31/15 10/10/15  AST 25  --  25 12*  ALT 26  --  15 12  ALKPHOS 107  --  90 109  BILITOT 0.7  --   --   --   PROT 7.2  --   --   --   ALBUMIN 4.2 3.2*  --   --     Recent Labs  07/19/15 2121  07/20/15 0450 07/21/15 0332  07/26/15 0407 07/27/15 0437 07/28/15 07/28/15 0237 07/31/15  WBC 5.9  --  6.8 9.3  < > 6.0 7.0 7.7 7.7 5.9  NEUTROABS 2.7  --  5.4 6.4  --   --   --   --   --   --   HGB 14.4  < > 12.6 13.1  < > 14.1 13.4  --  14.0 13.8  HCT 44.0  < > 39.3 40.4  < > 41.4 41.9  --  42.1 43  MCV 94.8  --  93.6 93.7  < > 92.8 93.5  --  94.6  --   PLT 138*  --  117* 122*  < > 117* 133*  --  145* 209  < > = values in this interval not displayed. Lab Results  Component Value Date   TSH 1.24 09/26/2015   Lab Results  Component Value Date   HGBA1C 7.3 09/26/2015   Lab Results  Component Value Date   CHOL 117 10/10/2015   HDL 50 10/10/2015   LDLCALC 56 10/10/2015   TRIG 57 10/10/2015   CHOLHDL 2.7 07/20/2015    Assessment/Plan Dysphagia Hx of CVA. Facility Nurse reports not swallowing medication, not talking or eating. Will have speech Therapist reevaluate swallowing.   Right Hand Pain  Worst with movement. No swelling or redness noted. Start Tylenol 500 mg Tablet twice daily. Continue to monitor.   Indwelling foley Catheter  Draining cloudy urine. Obtain U/A and C/S rule out UTI   Tachycardia HR 131 . Afebrile. Chest  pain free. Obtain Portable stat CXR Pa/Lat rule out PNA. EKG. Stat CBC/diff, BMP and TSH 11/25/2015.     Addendum:  Portable CXR results showed PNA versus Pulmunary Nodule Doxycycline 100 mg Tablet twice daily X 10 days initiated 11/26/2015.  11/28/2015 Urine results showed cloudy urine, large leukocytes and large blood. Cultures positive for 50,000-100,000 colonies of Proteus Mirabilis. Patient's urine results discussed with DR. Pandey recommend discontinue Doxycycline then start on Levofloxacin for Respiratory coverage and urinary tract infection. D/C Doxycycline then start levofloxacin 500 mg Tablet daily X 5 days. Portable CXR PA/Lat 12/12/2015 follow up PNA.    Spent > 40 minutes discussing plan of care with  Facility Nurse and Nurse supervisor; reviewing medical record; tests; labs; and developing future plan of care.  Family/ staff Communication: Reviewed plan of care with facility Nurse supervisor.   Labs/tests ordered: Urine specimen for  U/A and C/S. Stat CBC/diff, BMP and TSH 11/25/2015.  CXR Pa/Lat rule out PNA. EKG.

## 2015-11-26 LAB — CBC AND DIFFERENTIAL
HEMATOCRIT: 36 % (ref 36–46)
Hemoglobin: 11.8 g/dL — AB (ref 12.0–16.0)
Platelets: 321 10*3/uL (ref 150–399)
WBC: 11.8 10^3/mL

## 2015-11-26 LAB — BASIC METABOLIC PANEL
BUN: 36 mg/dL — AB (ref 4–21)
Creatinine: 0.8 mg/dL (ref 0.5–1.1)
GLUCOSE: 252 mg/dL
POTASSIUM: 3.7 mmol/L (ref 3.4–5.3)
SODIUM: 146 mmol/L (ref 137–147)

## 2015-12-03 ENCOUNTER — Encounter: Payer: Self-pay | Admitting: Internal Medicine

## 2015-12-03 ENCOUNTER — Non-Acute Institutional Stay (SKILLED_NURSING_FACILITY): Payer: PPO | Admitting: Internal Medicine

## 2015-12-03 DIAGNOSIS — E46 Unspecified protein-calorie malnutrition: Secondary | ICD-10-CM | POA: Diagnosis not present

## 2015-12-03 DIAGNOSIS — N312 Flaccid neuropathic bladder, not elsewhere classified: Secondary | ICD-10-CM | POA: Diagnosis not present

## 2015-12-03 DIAGNOSIS — M62838 Other muscle spasm: Secondary | ICD-10-CM

## 2015-12-03 DIAGNOSIS — J189 Pneumonia, unspecified organism: Secondary | ICD-10-CM

## 2015-12-03 DIAGNOSIS — B964 Proteus (mirabilis) (morganii) as the cause of diseases classified elsewhere: Secondary | ICD-10-CM | POA: Diagnosis not present

## 2015-12-03 DIAGNOSIS — I10 Essential (primary) hypertension: Secondary | ICD-10-CM | POA: Diagnosis not present

## 2015-12-03 DIAGNOSIS — I480 Paroxysmal atrial fibrillation: Secondary | ICD-10-CM | POA: Diagnosis not present

## 2015-12-03 DIAGNOSIS — R2689 Other abnormalities of gait and mobility: Secondary | ICD-10-CM

## 2015-12-03 DIAGNOSIS — N390 Urinary tract infection, site not specified: Secondary | ICD-10-CM

## 2015-12-03 DIAGNOSIS — M6249 Contracture of muscle, multiple sites: Secondary | ICD-10-CM | POA: Diagnosis not present

## 2015-12-03 NOTE — Progress Notes (Signed)
LOCATION: [facility]  PCP: Antony Blackbird, MD   Code Status: DNR  Goals of care: Advanced Directive information Advanced Directives 11/05/2015  Does patient have an advance directive? Yes  Type of Advance Directive Out of facility DNR (pink MOST or yellow form)  Does patient want to make changes to advanced directive? No - Patient declined  Copy of advanced directive(s) in chart? Yes       Extended Emergency Contact Information Primary Emergency Contact: Theroux,Brianna Address: [address]          [place], [place] [address] Johnnette Litter of [place] Phone: [phone] Relation: [place] Secondary Emergency Contact: Charlyne [place] of [place] Mobile Phone: [phone] Relation: Grandaughter   Allergies  Allergen Reactions  . Penicillins Rash    No other information available at this time  . Aspirin Other (See Comments)    Burning in stomach  . Morphine Other (See Comments)    delusions    Chief Complaint  Patient presents with  . Medical Management of Chronic Issues    Routine Visit     HPI:  Patient is a 82 y.o. female seen today for routine visit. She has been in bed mostly. She is under total care. She is currently on antibiotic for proteus UTI and focal pneumonia. Her appetite and mental state has improved per nursing with initiation of antibiotic. She has CVA with right hemiplegia and dysarthria. She does not participate in HPI and ROS. No fall reported. No new skin concern. No acute behavioral concern from nursing. On chart review, patient has lost weight.   Review of Systems: Unable to obtain     Past Medical History:  Diagnosis Date  . Asthma   . Cancer ([place])   . Dysphagia   . Hematuria   . Hypertension   . Hypokalemia   . PAF (paroxysmal atrial fibrillation) ([place])   . Stroke [facility])    Past Surgical History:  Procedure Laterality Date  . ABDOMINAL HYSTERECTOMY     PARTIAL  . APPENDECTOMY    . BREAST  SURGERY    . CERVICAL SPINE SURGERY    . ECTOPIC PREGNANCY SURGERY    . MYOMECTOMY    . NECK SURGERY     c-spine  . RADIOLOGY WITH ANESTHESIA N/A 07/19/2015   Procedure: RADIOLOGY WITH ANESTHESIA;  Surgeon: Luanne Bras, MD;  Location: [facility];  Service: Radiology;  Laterality: N/A;   Social History:   reports that she has never smoked. She has never used smokeless tobacco. She reports that she does not drink alcohol or use drugs.  Family History  Problem Relation Age of Onset  . Hypertension Mother   . Stroke Mother   . Cancer Sister     BREAST  . Stroke Sister     Medications:   Medication List       Accurate as of 12/03/15  2:33 PM. Always use your most recent med list.          acetaminophen 500 MG tablet Commonly known as:  TYLENOL Take 500 mg by mouth 2 (two) times daily.   amLODipine 10 MG tablet Commonly known as:  NORVASC Take 1 tablet (10 mg total) by mouth daily.   apixaban 5 MG Tabs tablet Commonly known as:  ELIQUIS Take 1 tablet (5 mg total) by mouth 2 (two) times daily.   atorvastatin 10 MG tablet Commonly known as:  LIPITOR Take 1 tablet (10 mg total) by mouth daily at 6  PM.   baclofen 10 MG tablet Commonly known as:  LIORESAL Take 10 mg by mouth 3 (three) times daily.   buPROPion 100 MG 12 hr tablet Commonly known as:  WELLBUTRIN SR Take 1 tablet (100 mg total) by mouth daily.   diltiazem 60 MG tablet Commonly known as:  CARDIZEM Take 1 tablet (60 mg total) by mouth every 8 (eight) hours.   hydrALAZINE 25 MG tablet Commonly known as:  APRESOLINE Take 1 tablet (25 mg total) by mouth every 8 (eight) hours.   lisinopril 20 MG tablet Commonly known as:  PRINIVIL,ZESTRIL Take 1 tablet (20 mg total) by mouth 2 (two) times daily.   multivitamin with minerals tablet Take 1 tablet by mouth daily.   potassium chloride 20 MEQ/15ML (10%) Soln Take 15 mLs (20 mEq total) by mouth 2 (two) times daily.   saccharomyces boulardii 250 MG  capsule Commonly known as:  FLORASTOR Take 250 mg by mouth 2 (two) times daily. Stop date 12/06/15   UNABLE TO FIND Med Name: Med pass 20 mL 2 times daily       Immunizations: Immunization History  Administered Date(s) Administered  . PPD Test 07/28/2015, 08/11/2015     Physical Exam: Vitals:   12/03/15 1352  BP: 93/63  Pulse: 98  Resp: 20  Temp: 97.9 F (36.6 C)  TempSrc: Oral  SpO2: 99%  Weight: 146 lb 3.2 oz (66.3 kg)  Height: 5\' 2"  (1.575 m)   Wt Readings from Last 3 Encounters:  12/03/15 146 lb 3.2 oz (66.3 kg)  11/25/15 151 lb (68.5 kg)  11/05/15 151 lb 6.4 oz (68.7 kg)    Body mass index is 26.74 kg/m.  General- elderly female, frail, in no acute distress Head- normocephalic, atraumatic Nose- no maxillary or frontal sinus tenderness, no nasal discharge Throat- moist mucus membrane  Eyes- PERRLA, EOMI, no pallor, no icterus Neck- no cervical lymphadenopathy Cardiovascular- irregular heart rate, no murmur Respiratory- bilateral clear to auscultation, no wheeze, no rhonchi, no crackles, no use of accessory muscles Abdomen- bowel sounds present, soft, non tender, foley catheter present Musculoskeletal- right sided hemiplegia, aphasia, left sided movement present, no leg edema Neurological- alert and oriented to self Skin- warm and dry   Labs reviewed: Basic Metabolic Panel:  Recent Labs  07/21/15 0332  07/26/15 0407 07/27/15 0437  07/28/15 0237 07/31/15 08/27/15 11/26/15  NA 144  < > 142 142  < > 143 144 142 146  K 4.0  < > 2.5* 3.3*  --  3.9 4.0 4.2 3.7  CL 113*  < > 106 109  --  111  --   --   --   CO2 20*  < > 28 24  --  24  --   --   --   GLUCOSE 121*  < > 112* 132*  --  144*  --   --   --   BUN 21*  < > 10 13  < > 16 22* 12 36*  CREATININE 0.74  < > 0.57 0.62  < > 0.62 0.6 0.6 0.8  CALCIUM 8.7*  < > 8.2* 8.2*  --  8.6*  --   --   --   MG 2.0  --   --  1.7  --   --   --   --   --   PHOS 3.3  --   --   --   --   --   --   --   --   < > =  values in this interval not displayed. Liver Function Tests:  Recent Labs  07/19/15 2121 07/21/15 0332 07/31/15 10/10/15  AST 25  --  25 12*  ALT 26  --  15 12  ALKPHOS 107  --  90 109  BILITOT 0.7  --   --   --   PROT 7.2  --   --   --   ALBUMIN 4.2 3.2*  --   --    No results for input(s): LIPASE, AMYLASE in the last 8760 hours. No results for input(s): AMMONIA in the last 8760 hours. CBC:  Recent Labs  07/19/15 2121  07/20/15 0450 07/21/15 0332  07/26/15 0407 07/27/15 0437  07/28/15 0237 07/31/15 11/26/15  WBC 5.9  --  6.8 9.3  < > 6.0 7.0  < > 7.7 5.9 11.8  NEUTROABS 2.7  --  5.4 6.4  --   --   --   --   --   --   --   HGB 14.4  < > 12.6 13.1  < > 14.1 13.4  --  14.0 13.8 11.8*  HCT 44.0  < > 39.3 40.4  < > 41.4 41.9  --  42.1 43 36  MCV 94.8  --  93.6 93.7  < > 92.8 93.5  --  94.6  --   --   PLT 138*  --  117* 122*  < > 117* 133*  --  145* 209 321  < > = values in this interval not displayed. Cardiac Enzymes: No results for input(s): CKTOTAL, CKMB, CKMBINDEX, TROPONINI in the last 8760 hours. BNP: Invalid input(s): POCBNP CBG:  Recent Labs  07/28/15 0759 07/28/15 1127 07/28/15 1607  GLUCAP 115* 217* 137*    Radiological Exams: No results found. 2D echo  - Left ventricle: The cavity size was normal. There was mild concentric hypertrophy. Systolic function was vigorous. The estimated ejection fraction was in the range of 65% to 70%.   - Aortic valve: Valve area (VTI): 1.51 cm^2. Valve area (Vmax): 1.74 cm^2. Valve area (Vmean): 1.74 cm^2. - Mitral valve: There was mild regurgitation. - Left atrium: The atrium was mildly dilated. - Right atrium: The atrium was mildly dilated. - Atrial septum: No defect or patent foramen ovale was identified.   Modified Barium Swallow with speech therapy 07/23/2015 Delayed swallow initiation noted Mild aspiration risk Dysphagia 2 diet with thin liquids recommended    Assessment/Plan  Proteus UTI Continue and  complete course of levofloxacin 500 mg daily today. continue foley care  HCAP Continue and complete levofloxacin today. Breathing stable at present. monitor clinically  Decreased mobility With her cva with right sided hemiplegia, will need to be OOB daily. will have PT and OT to evaluate for need for exercise with restorative team and possible splint to prevent contractures  Muscle spasticity Continue baclofen 10 mg tid  Protein calorie malnutrition Monitor weight and po intake. Continue medpass and multivitamin. RD to follow.  afib Rate controlled. Continue cardizem 60 mg tid and eliquis 5 mg bid for anticoagulation  HTN Several soft bp reading. Check bp q shift for now. Change lisinopril to 20 mg daily. Continue hydralazine 25 mg tid and amlodipine 10 mg daily.  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 Continue foley care     Blanchie Serve, MD Internal Medicine [facility] [place], [place] [address] Cell Phone (Monday-Friday 8 am - 5 pm): [phone] On Call: [phone] and follow  prompts after 5 pm and on weekends Office Phone: [phone] Office Fax: [phone]

## 2015-12-04 DIAGNOSIS — E46 Unspecified protein-calorie malnutrition: Secondary | ICD-10-CM | POA: Insufficient documentation

## 2015-12-04 DIAGNOSIS — N312 Flaccid neuropathic bladder, not elsewhere classified: Secondary | ICD-10-CM | POA: Insufficient documentation

## 2015-12-04 DIAGNOSIS — R2689 Other abnormalities of gait and mobility: Secondary | ICD-10-CM | POA: Insufficient documentation

## 2015-12-08 ENCOUNTER — Non-Acute Institutional Stay (SKILLED_NURSING_FACILITY): Payer: PPO | Admitting: Internal Medicine

## 2015-12-08 ENCOUNTER — Encounter: Payer: Self-pay | Admitting: Internal Medicine

## 2015-12-08 DIAGNOSIS — I952 Hypotension due to drugs: Secondary | ICD-10-CM

## 2015-12-08 DIAGNOSIS — K5900 Constipation, unspecified: Secondary | ICD-10-CM | POA: Diagnosis not present

## 2015-12-08 DIAGNOSIS — K5641 Fecal impaction: Secondary | ICD-10-CM | POA: Diagnosis not present

## 2015-12-08 DIAGNOSIS — R100 Acute abdomen: Secondary | ICD-10-CM | POA: Diagnosis not present

## 2015-12-08 DIAGNOSIS — R109 Unspecified abdominal pain: Secondary | ICD-10-CM

## 2015-12-08 NOTE — Progress Notes (Signed)
LOCATION: [facility]  PCP: Antony Blackbird, MD   Code Status: DNR  Goals of care: Advanced Directive information Advanced Directives 12/03/2015  Does patient have an advance directive? Yes  Type of Advance Directive Out of facility DNR (pink MOST or yellow form)  Does patient want to make changes to advanced directive? -  Copy of advanced directive(s) in chart? Yes       Extended Emergency Contact Information Primary Emergency Contact: Oertel,Brianna Address: [address]          [place], [place] [address] Johnnette Litter of [place] Phone: [phone] Relation: [place] Secondary Emergency Contact: Charlyne [place] of [place] Mobile Phone: [phone] Relation: Grandaughter   Allergies  Allergen Reactions  . Penicillins Rash    No other information available at this time  . Aspirin Other (See Comments)    Burning in stomach  . Morphine Other (See Comments)    delusions   Chief Complaint  Patient presents with  . Acute Visit    Abdominal Pain, low BP    HPI:  Patient is a 82 y.o. female seen today for acute visit. She has been complaining of abdominal pain and had a stat KUB ordered yesterday that shows increased amount of stool in rectal vault and ascending colon without signs of perforation. She is seen at her bedside today. She denies any nausea or vomiting. No vomiting, fever or distress documented by nursing staff. She has advanced dementia and does not participate in HPI and ROS. On chart review, her BP reading remains on lower side.   Review of Systems: Unable to obtain     Past Medical History:  Diagnosis Date  . Asthma   . Cancer ([place])   . Dysphagia   . Hematuria   . Hypertension   . Hypokalemia   . PAF (paroxysmal atrial fibrillation) ([place])   . Stroke [facility])    Past Surgical History:  Procedure Laterality Date  . ABDOMINAL HYSTERECTOMY     PARTIAL  . APPENDECTOMY    . BREAST SURGERY    . CERVICAL SPINE  SURGERY    . ECTOPIC PREGNANCY SURGERY    . MYOMECTOMY    . NECK SURGERY     c-spine  . RADIOLOGY WITH ANESTHESIA N/A 07/19/2015   Procedure: RADIOLOGY WITH ANESTHESIA;  Surgeon: Luanne Bras, MD;  Location: [facility];  Service: Radiology;  Laterality: N/A;   Social History:   reports that she has never smoked. She has never used smokeless tobacco. She reports that she does not drink alcohol or use drugs.  Family History  Problem Relation Age of Onset  . Hypertension Mother   . Stroke Mother   . Cancer Sister     BREAST  . Stroke Sister     Medications:   Medication List       Accurate as of 12/08/15  1:48 PM. Always use your most recent med list.          acetaminophen 500 MG tablet Commonly known as:  TYLENOL Take 500 mg by mouth 2 (two) times daily.   amLODipine 10 MG tablet Commonly known as:  NORVASC Take 1 tablet (10 mg total) by mouth daily.   apixaban 5 MG Tabs tablet Commonly known as:  ELIQUIS Take 1 tablet (5 mg total) by mouth 2 (two) times daily.   atorvastatin 10 MG tablet Commonly known as:  LIPITOR Take 1 tablet (10 mg total) by mouth daily at 6 PM.  baclofen 10 MG tablet Commonly known as:  LIORESAL Take 10 mg by mouth 3 (three) times daily.   buPROPion 100 MG 12 hr tablet Commonly known as:  WELLBUTRIN SR Take 1 tablet (100 mg total) by mouth daily.   diltiazem 60 MG tablet Commonly known as:  CARDIZEM Take 1 tablet (60 mg total) by mouth every 8 (eight) hours.   hydrALAZINE 25 MG tablet Commonly known as:  APRESOLINE Take 1 tablet (25 mg total) by mouth every 8 (eight) hours.   lisinopril 20 MG tablet Commonly known as:  PRINIVIL,ZESTRIL Take 20 mg by mouth daily.   multivitamin with minerals tablet Take 1 tablet by mouth daily.   potassium chloride 20 MEQ/15ML (10%) Soln Take 15 mLs (20 mEq total) by mouth 2 (two) times daily.   UNABLE TO FIND Med Name: Med pass 20 mL 2 times daily       Immunizations: Immunization  History  Administered Date(s) Administered  . PPD Test 07/28/2015, 08/11/2015     Physical Exam: Vitals:   12/08/15 1231  BP: (!) 93/59  Pulse: 88  Resp: 20  Temp: (!) 96.7 F (35.9 C)  TempSrc: Oral  SpO2: 98%  Weight: 146 lb 3.2 oz (66.3 kg)  Height: 5\' 2"  (1.575 m)   Wt Readings from Last 3 Encounters:  12/08/15 146 lb 3.2 oz (66.3 kg)  12/03/15 146 lb 3.2 oz (66.3 kg)  11/25/15 151 lb (68.5 kg)    Body mass index is 26.74 kg/m.   General- elderly female, frail, in no acute distress Head- normocephalic, atraumatic Nose- no nasal discharge Throat- moist mucus membrane  Eyes- PERRLA, EOMI, no pallor, no icterus Neck- no cervical lymphadenopathy Cardiovascular- irregular heart rate Respiratory- bilateral clear to auscultation, no use of accessory muscles Abdomen- bowel sounds present, soft, non tender, foley catheter present Musculoskeletal- right sided hemiplegia, aphasia, left sided movement present, no leg edema Neurological- alert and oriented to self Skin- warm and dry   Labs reviewed: Basic Metabolic Panel:  Recent Labs  07/21/15 0332  07/26/15 0407 07/27/15 0437  07/28/15 0237 07/31/15 08/27/15 11/26/15  NA 144  < > 142 142  < > 143 144 142 146  K 4.0  < > 2.5* 3.3*  --  3.9 4.0 4.2 3.7  CL 113*  < > 106 109  --  111  --   --   --   CO2 20*  < > 28 24  --  24  --   --   --   GLUCOSE 121*  < > 112* 132*  --  144*  --   --   --   BUN 21*  < > 10 13  < > 16 22* 12 36*  CREATININE 0.74  < > 0.57 0.62  < > 0.62 0.6 0.6 0.8  CALCIUM 8.7*  < > 8.2* 8.2*  --  8.6*  --   --   --   MG 2.0  --   --  1.7  --   --   --   --   --   PHOS 3.3  --   --   --   --   --   --   --   --   < > = values in this interval not displayed. Liver Function Tests:  Recent Labs  07/19/15 2121 07/21/15 0332 07/31/15 10/10/15  AST 25  --  25 12*  ALT 26  --  15 12  ALKPHOS 107  --  90 109  BILITOT 0.7  --   --   --   PROT 7.2  --   --   --   ALBUMIN 4.2 3.2*  --   --     No results for input(s): LIPASE, AMYLASE in the last 8760 hours. No results for input(s): AMMONIA in the last 8760 hours. CBC:  Recent Labs  07/19/15 2121  07/20/15 0450 07/21/15 0332  07/26/15 0407 07/27/15 0437  07/28/15 0237 07/31/15 11/26/15  WBC 5.9  --  6.8 9.3  < > 6.0 7.0  < > 7.7 5.9 11.8  NEUTROABS 2.7  --  5.4 6.4  --   --   --   --   --   --   --   HGB 14.4  < > 12.6 13.1  < > 14.1 13.4  --  14.0 13.8 11.8*  HCT 44.0  < > 39.3 40.4  < > 41.4 41.9  --  42.1 43 36  MCV 94.8  --  93.6 93.7  < > 92.8 93.5  --  94.6  --   --   PLT 138*  --  117* 122*  < > 117* 133*  --  145* 209 321  < > = values in this interval not displayed. Cardiac Enzymes: No results for input(s): CKTOTAL, CKMB, CKMBINDEX, TROPONINI in the last 8760 hours. BNP: Invalid input(s): POCBNP CBG:  Recent Labs  07/28/15 0759 07/28/15 1127 07/28/15 1607  GLUCAP 115* 217* 137*    Radiological Exams: KUB X-Ray Kidney, Ureter, Bladder 12/07/15 Imaging. Images of the abdomen demonstrates marked stool in the ascending colon and rectal vault. There is no free air. No calculus is noted.  2D echo  - Left ventricle: The cavity size was normal. There was mild concentric hypertrophy. Systolic function was vigorous. The estimated ejection fraction was in the range of 65% to 70%.   - Aortic valve: Valve area (VTI): 1.51 cm^2. Valve area (Vmax): 1.74 cm^2. Valve area (Vmean): 1.74 cm^2. - Mitral valve: There was mild regurgitation. - Left atrium: The atrium was mildly dilated. - Right atrium: The atrium was mildly dilated. - Atrial septum: No defect or patent foramen ovale was identified.   Modified Barium Swallow with speech therapy 07/23/2015 Delayed swallow initiation noted Mild aspiration risk Dysphagia 2 diet with thin liquids recommended    Assessment/Plan  Acute abdominal pain From stool impaction. No nausea or vomiting. Abdominal exam does not reveal acute abdomen. Bowel sound is present.  Will get her disimpacted digitally and then provide dulcolax suppository for today. Start colace 100 mg bid with miralax 17 g qod for now to prevent constipation with concern for neurogenic bowel. Check cbc and cmp.  Impacted stool  See above  Constipation To start colace and miralax as above  Hypotension Has soft bp reading with rest of vital signs stable, encourage hydration. her lisinopril dosing was decreased last visit to 20 mg qd from bid. D/c her norvasc for now and decrease her hydralazine to 25 mg bid from tid and monitor bp reading q shift and have holding parameters for antihypertensives.    Blanchie Serve, MD Internal Medicine [facility] [address] [place], [place] [address] Cell Phone (Monday-Friday 8 am - 5 pm): [phone] On Call: [phone] and follow prompts after 5 pm and on weekends Office Phone: [phone] Office Fax: [phone]

## 2015-12-09 LAB — CBC AND DIFFERENTIAL
HEMATOCRIT: 33 % — AB (ref 36–46)
HEMOGLOBIN: 10.6 g/dL — AB (ref 12.0–16.0)
PLATELETS: 274 10*3/uL (ref 150–399)
WBC: 6.3 10*3/mL

## 2015-12-09 LAB — BASIC METABOLIC PANEL
BUN: 11 mg/dL (ref 4–21)
Creatinine: 0.5 mg/dL (ref 0.5–1.1)
GLUCOSE: 139 mg/dL
POTASSIUM: 3.9 mmol/L (ref 3.4–5.3)
SODIUM: 141 mmol/L (ref 137–147)

## 2015-12-09 LAB — HEPATIC FUNCTION PANEL
ALT: 9 U/L (ref 7–35)
AST: 11 U/L — AB (ref 13–35)
Alkaline Phosphatase: 110 U/L (ref 25–125)
Bilirubin, Total: 0.4 mg/dL

## 2016-01-01 LAB — HEMOGLOBIN A1C: Hemoglobin A1C: 7.7

## 2016-01-02 ENCOUNTER — Encounter: Payer: Self-pay | Admitting: Family

## 2016-01-02 ENCOUNTER — Non-Acute Institutional Stay (SKILLED_NURSING_FACILITY): Payer: PPO | Admitting: Family

## 2016-01-02 DIAGNOSIS — R0601 Orthopnea: Secondary | ICD-10-CM | POA: Diagnosis not present

## 2016-01-02 DIAGNOSIS — F329 Major depressive disorder, single episode, unspecified: Secondary | ICD-10-CM | POA: Diagnosis not present

## 2016-01-02 DIAGNOSIS — I10 Essential (primary) hypertension: Secondary | ICD-10-CM

## 2016-01-02 DIAGNOSIS — R1312 Dysphagia, oropharyngeal phase: Secondary | ICD-10-CM | POA: Diagnosis not present

## 2016-01-02 DIAGNOSIS — E782 Mixed hyperlipidemia: Secondary | ICD-10-CM | POA: Diagnosis not present

## 2016-01-02 DIAGNOSIS — E440 Moderate protein-calorie malnutrition: Secondary | ICD-10-CM | POA: Diagnosis not present

## 2016-01-02 DIAGNOSIS — G8191 Hemiplegia, unspecified affecting right dominant side: Secondary | ICD-10-CM

## 2016-01-02 DIAGNOSIS — F32A Depression, unspecified: Secondary | ICD-10-CM

## 2016-01-02 NOTE — Progress Notes (Signed)
Location:   [facility] Room Number: 301B Place of Service:  SNF (31) Provider:  Marlowe Sax, FNP-C   FULP, CAMMIE, MD  Patient Care Team: Antony Blackbird, MD as PCP - General (Family Medicine)  Extended Emergency Contact Information Primary Emergency Contact: Cleary,Brianna Address: [address]          [place], [place] [address] Johnnette Litter of [place] Phone: [phone] Relation: [place] Secondary Emergency Contact: [place] of [place] Mobile Phone: [phone] Relation: Grandaughter  Code Status:  DNR Goals of care: Advanced Directive information Advanced Directives 01/02/2016  Does patient have an advance directive? Yes  Type of Advance Directive Out of facility DNR (pink MOST or yellow form)  Does patient want to make changes to advanced directive? -  Copy of advanced directive(s) in chart? Yes  Pre-existing out of facility DNR order (yellow form or pink MOST form) Yellow form placed in chart (order not valid for inpatient use)     Chief Complaint  Patient presents with  . Medical Management of Chronic Issues    routine    HPI:  Pt is a 82 y.o. female seen today at [facility] for medical management of chronic diseases. She has a medical history of HTN, Hyperlipidemia, CVA, cancer among others. She is seen in her room today. She complains of shortness of breath when lying down in bed. She has had no recent fall episode, weight changes or hospital admission. Facility staff reports no new concerns.    Past Medical History:  Diagnosis Date  . Asthma   . Cancer ([place])   . Dysphagia   . Hematuria   . Hypertension   . Hypokalemia   . PAF (paroxysmal atrial fibrillation) ([place])   . Stroke [facility])    Past Surgical History:  Procedure Laterality Date  . ABDOMINAL HYSTERECTOMY     PARTIAL  . APPENDECTOMY    . BREAST SURGERY    . CERVICAL SPINE SURGERY    . ECTOPIC PREGNANCY  SURGERY    . MYOMECTOMY    . NECK SURGERY     c-spine  . RADIOLOGY WITH ANESTHESIA N/A 07/19/2015   Procedure: RADIOLOGY WITH ANESTHESIA;  Surgeon: Luanne Bras, MD;  Location: [facility];  Service: Radiology;  Laterality: N/A;    Allergies  Allergen Reactions  . Penicillins Rash    No other information available at this time  . Aspirin Other (See Comments)    Burning in stomach  . Morphine Other (See Comments)    delusions      Medication List       Accurate as of 01/02/16 11:42 AM. Always use your most recent med list.          acetaminophen 500 MG tablet Commonly known as:  TYLENOL Take 500 mg by mouth 2 (two) times daily.   acetaminophen 325 MG tablet Commonly known as:  TYLENOL Take 325 mg by mouth. Take 2 tablets every 4 hours as needed for pain   apixaban 5 MG Tabs tablet Commonly known as:  ELIQUIS Take 1 tablet (5 mg total) by mouth 2 (two) times daily.   atorvastatin 10 MG tablet Commonly known as:  LIPITOR Take 1 tablet (10 mg total) by mouth daily at 6 PM.   baclofen 10 MG tablet Commonly known as:  LIORESAL Take 10 mg by mouth 3 (three) times daily.   buPROPion 100 MG 12 hr tablet Commonly known as:  WELLBUTRIN  SR Take 1 tablet (100 mg total) by mouth daily.   diltiazem 60 MG tablet Commonly known as:  CARDIZEM Take 1 tablet (60 mg total) by mouth every 8 (eight) hours.   docusate sodium 100 MG capsule Commonly known as:  COLACE Take 100 mg by mouth. Take one tablet twice daily   hydrALAZINE 25 MG tablet Commonly known as:  APRESOLINE Take 25 mg by mouth. Take one tablet twice daily. Hold for SBP<110   lisinopril 20 MG tablet Commonly known as:  PRINIVIL,ZESTRIL Take 20 mg by mouth daily.   polyethylene glycol packet Commonly known as:  MIRALAX / GLYCOLAX Take 17 g by mouth. Take every other day for constipation   potassium chloride 20 MEQ/15ML (10%) Soln Take 15 mLs (20 mEq total) by mouth 2 (two) times daily.       Review of  Systems  Unable to perform ROS: Other    Immunization History  Administered Date(s) Administered  . PPD Test 07/28/2015, 08/11/2015   Pertinent  Health Maintenance Due  Topic Date Due  . INFLUENZA VACCINE  01/25/2016 (Originally 11/25/2015)  . DEXA SCAN  04/26/2017 (Originally 04/25/1992)  . PNA vac Low Risk Adult (1 of 2 - PCV13) 04/26/2017 (Originally 04/25/1992)   Fall Risk  09/30/2015  Falls in the past year? No    Vitals:   01/02/16 1104  BP: (!) 146/74  Pulse: 80  Temp: 97.8 F (36.6 C)  SpO2: 95%  Weight: 146 lb (66.2 kg)  Height: 5\' 2"  (1.575 m)   Body mass index is 26.7 kg/m. Physical Exam  Constitutional: She appears well-developed and well-nourished. No distress.  Elderly  HENT:  Head: Normocephalic.  Mouth/Throat: Oropharynx is clear and moist. No oropharyngeal exudate.  Eyes: Conjunctivae and EOM are normal. Pupils are equal, round, and reactive to light. Right eye exhibits no discharge. Left eye exhibits no discharge. No scleral icterus.  Neck: Normal range of motion. Neck supple. No JVD present. No thyromegaly present.  Cardiovascular: Normal rate, regular rhythm, normal heart sounds and intact distal pulses.  Exam reveals no gallop and no friction rub.   No murmur heard. Pulmonary/Chest: Effort normal. No respiratory distress. She has no wheezes. She has no rales.  Diminished breath sounds.   Abdominal: Soft. Bowel sounds are normal. She exhibits no distension. There is no tenderness. There is no rebound and no guarding.  Genitourinary:  Genitourinary Comments: Foley Catheter   Musculoskeletal: She exhibits no edema or tenderness.  Right side hemiparalysis.  Lymphadenopathy:    She has no cervical adenopathy.  Neurological: She is alert.  Skin: Skin is warm and dry. No rash noted. No erythema. No pallor.  Psychiatric: She has a normal mood and affect.    Labs reviewed:  Recent Labs  07/21/15 0332  07/26/15 0407 07/27/15 0437  07/28/15 0237  07/31/15 08/27/15 11/26/15  NA 144  < > 142 142  < > 143 144 142 146  K 4.0  < > 2.5* 3.3*  --  3.9 4.0 4.2 3.7  CL 113*  < > 106 109  --  111  --   --   --   CO2 20*  < > 28 24  --  24  --   --   --   GLUCOSE 121*  < > 112* 132*  --  144*  --   --   --   BUN 21*  < > 10 13  < > 16 22* 12 36*  CREATININE 0.74  < >  0.57 0.62  < > 0.62 0.6 0.6 0.8  CALCIUM 8.7*  < > 8.2* 8.2*  --  8.6*  --   --   --   MG 2.0  --   --  1.7  --   --   --   --   --   PHOS 3.3  --   --   --   --   --   --   --   --   < > = values in this interval not displayed.  Recent Labs  07/19/15 2121 07/21/15 0332 07/31/15 10/10/15  AST 25  --  25 12*  ALT 26  --  15 12  ALKPHOS 107  --  90 109  BILITOT 0.7  --   --   --   PROT 7.2  --   --   --   ALBUMIN 4.2 3.2*  --   --     Recent Labs  07/19/15 2121  07/20/15 0450 07/21/15 0332  07/26/15 0407 07/27/15 0437  07/28/15 0237 07/31/15 11/26/15  WBC 5.9  --  6.8 9.3  < > 6.0 7.0  < > 7.7 5.9 11.8  NEUTROABS 2.7  --  5.4 6.4  --   --   --   --   --   --   --   HGB 14.4  < > 12.6 13.1  < > 14.1 13.4  --  14.0 13.8 11.8*  HCT 44.0  < > 39.3 40.4  < > 41.4 41.9  --  42.1 43 36  MCV 94.8  --  93.6 93.7  < > 92.8 93.5  --  94.6  --   --   PLT 138*  --  117* 122*  < > 117* 133*  --  145* 209 321  < > = values in this interval not displayed. Lab Results  Component Value Date   TSH 1.24 09/26/2015   Lab Results  Component Value Date   HGBA1C 7.3 09/26/2015   Lab Results  Component Value Date   CHOL 117 10/10/2015   HDL 50 10/10/2015   LDLCALC 56 10/10/2015   TRIG 57 10/10/2015   CHOLHDL 2.7 07/20/2015   Assessment/Plan HTN B/p stable. Continue on Hydralazine, lisinopril and diltiazem. Monitor BMP   Depression Stable. Continue on Bupropion. Monitor for mood changes.    Hyperlipidemia  Continue on atorvastatin. Monitor lipid panel every 3-6 months.   Dysphagia Continue with aspiration precautions.   Right hemiplegia Due to CVA. Continue on  apixaban and Atorvastatin . Fall and safety precautions.  Protein malnutrition Continue on protein supplements. Monitor BMP   Orthopnea  Will obtain portable CXR Pa/Lat rule out PNA.    Family/ staff Communication: Janesia Joswick FNP-C   Labs/tests ordered:  Portable CXR Pa/Lat r/o PNA

## 2016-01-29 ENCOUNTER — Encounter: Payer: Self-pay | Admitting: Family

## 2016-01-29 ENCOUNTER — Non-Acute Institutional Stay (SKILLED_NURSING_FACILITY): Payer: PPO | Admitting: Family

## 2016-01-29 DIAGNOSIS — F329 Major depressive disorder, single episode, unspecified: Secondary | ICD-10-CM

## 2016-01-29 DIAGNOSIS — R399 Unspecified symptoms and signs involving the genitourinary system: Secondary | ICD-10-CM

## 2016-01-29 DIAGNOSIS — I639 Cerebral infarction, unspecified: Secondary | ICD-10-CM

## 2016-01-29 DIAGNOSIS — R4701 Aphasia: Secondary | ICD-10-CM

## 2016-01-29 DIAGNOSIS — I10 Essential (primary) hypertension: Secondary | ICD-10-CM | POA: Diagnosis not present

## 2016-01-29 DIAGNOSIS — E782 Mixed hyperlipidemia: Secondary | ICD-10-CM | POA: Diagnosis not present

## 2016-01-29 DIAGNOSIS — F32A Depression, unspecified: Secondary | ICD-10-CM

## 2016-01-29 DIAGNOSIS — I480 Paroxysmal atrial fibrillation: Secondary | ICD-10-CM | POA: Diagnosis not present

## 2016-01-29 DIAGNOSIS — E440 Moderate protein-calorie malnutrition: Secondary | ICD-10-CM | POA: Diagnosis not present

## 2016-01-29 DIAGNOSIS — IMO0002 Reserved for concepts with insufficient information to code with codable children: Secondary | ICD-10-CM

## 2016-01-29 NOTE — Progress Notes (Signed)
Location:  [place] Room Number: [place]:  SNF (31) Provider:  Dinah Ngetich FNP-C   FULP, CAMMIE, MD  Patient Care Team: Antony Blackbird, MD as PCP - General (Family Medicine)  Extended Emergency Contact Information Primary Emergency Contact: Kinzler,Brianna Address: [address]          [place], [place] [address] Johnnette Litter of [place] Phone: [phone] Relation: [place] Secondary Emergency Contact: [place] Mobile Phone: [phone] Relation: Grandaughter  Code Status:  DNR  Goals of care: Advanced Directive information Advanced Directives 01/29/2016  Does patient have an advance directive? Yes  Type of Advance Directive Out of facility DNR (pink MOST or yellow form)  Does patient want to make changes to advanced directive? -  Copy of advanced directive(s) in chart? Yes  Pre-existing out of facility DNR order (yellow form or pink MOST form) -     Chief Complaint  Patient presents with  . Medical Management of Chronic Issues    Routine Visit    HPI:  Pt is a 82 y.o. female seen today at [facility] for medical management of chronic diseases.  She has a medical history of CVA, Afib, HTN, Hyperlipidemia among other conditions. She is seen in her room today. She denies any acute issues this visit.she is communicating verbally this visit. She has had no recent hospital admission since prior visit. No fall episode reported. No weight loss.    Past Medical History:  Diagnosis Date  . Asthma   . Cancer ([place])   . Dysphagia   . Hematuria   . Hypertension   . Hypokalemia   . PAF (paroxysmal atrial fibrillation) ([place])   . Stroke [facility])    Past Surgical History:  Procedure Laterality Date  . ABDOMINAL HYSTERECTOMY     PARTIAL  . APPENDECTOMY    . BREAST SURGERY    . CERVICAL SPINE SURGERY    . ECTOPIC PREGNANCY SURGERY    . MYOMECTOMY    . NECK  SURGERY     c-spine  . RADIOLOGY WITH ANESTHESIA N/A 07/19/2015   Procedure: RADIOLOGY WITH ANESTHESIA;  Surgeon: Luanne Bras, MD;  Location: [place];  Service: Radiology;  Laterality: N/A;    Allergies  Allergen Reactions  . Penicillins Rash    No other information available at this time  . Aspirin Other (See Comments)    Burning in stomach  . Morphine Other (See Comments)    delusions      Medication List       Accurate as of 01/29/16  9:12 AM. Always use your most recent med list.          acetaminophen 500 MG tablet Commonly known as:  TYLENOL Take 500 mg by mouth 2 (two) times daily.   acetaminophen 325 MG tablet Commonly known as:  TYLENOL Take 2 tablets every 4 hours as needed for pain   apixaban 5 MG Tabs tablet Commonly known as:  ELIQUIS Take 1 tablet (5 mg total) by mouth 2 (two) times daily.   atorvastatin 10 MG tablet Commonly known as:  LIPITOR Take 1 tablet (10 mg total) by mouth daily at 6 PM.   baclofen 10 MG tablet Commonly known as:  LIORESAL Take 10 mg by mouth 3 (three) times daily.   buPROPion 100 MG 12 hr tablet Commonly known as:  WELLBUTRIN SR Take 1 tablet (100 mg total) by mouth daily.  diltiazem 60 MG tablet Commonly known as:  CARDIZEM Take 1 tablet (60 mg total) by mouth every 8 (eight) hours.   docusate sodium 100 MG capsule Commonly known as:  COLACE Take 100 mg by mouth. Take one tablet twice daily   hydrALAZINE 25 MG tablet Commonly known as:  APRESOLINE Take one tablet twice daily. Hold for SBP<110   lisinopril 20 MG tablet Commonly known as:  PRINIVIL,ZESTRIL Take 20 mg by mouth daily.   polyethylene glycol packet Commonly known as:  MIRALAX / GLYCOLAX Take 17 g by mouth. Take every other day for constipation   potassium chloride 20 MEQ/15ML (10%) Soln Take 15 mLs (20 mEq total) by mouth 2 (two) times daily.       Review of Systems  Unable to perform ROS: Other    Immunization History  Administered  Date(s) Administered  . PPD Test 07/28/2015, 08/11/2015   Pertinent  Health Maintenance Due  Topic Date Due  . INFLUENZA VACCINE  11/25/2015  . DEXA SCAN  04/26/2017 (Originally 04/25/1992)  . PNA vac Low Risk Adult (1 of 2 - PCV13) 04/26/2017 (Originally 04/25/1992)   Fall Risk  09/30/2015  Falls in the past year? No   Functional Status Survey:    Vitals:   01/29/16 0858  BP: 120/70  Pulse: 84  Resp: 20  Temp: 97.8 F (36.6 C)  Weight: 146 lb (66.2 kg)  Height: 5\' 2"  (1.575 m)   Body mass index is 26.7 kg/m. Physical Exam  Constitutional: She appears well-developed and well-nourished. No distress.  Elderly in no acute distress.    HENT:  Head: Normocephalic.  Mouth/Throat: Oropharynx is clear and moist. No oropharyngeal exudate.  Aphasia   Eyes: Conjunctivae and EOM are normal. Pupils are equal, round, and reactive to light. Right eye exhibits no discharge. Left eye exhibits no discharge. No scleral icterus.  Neck: Normal range of motion. Neck supple. No JVD present. No thyromegaly present.  Cardiovascular: Normal rate, regular rhythm, normal heart sounds and intact distal pulses.  Exam reveals no gallop and no friction rub.   No murmur heard. Pulmonary/Chest: Effort normal and breath sounds normal. No respiratory distress. She has no wheezes. She has no rales.  Abdominal: Soft. Bowel sounds are normal. She exhibits no distension. There is no tenderness. There is no rebound and no guarding.  Genitourinary:  Genitourinary Comments: Foley Catheter draining adequates amount of urine cloudy in color.   Musculoskeletal: She exhibits no edema or tenderness.  Right side hemiparalysis.   Lymphadenopathy:    She has no cervical adenopathy.  Neurological: She is alert.  Skin: Skin is warm and dry. No rash noted. No erythema. No pallor.  Psychiatric: She has a normal mood and affect.    Labs reviewed:  Recent Labs  07/21/15 0332  07/26/15 0407 07/27/15 0437   07/28/15 0237  08/27/15 11/26/15 12/09/15  NA 144  < > 142 142  < > 143  < > 142 146 141  K 4.0  < > 2.5* 3.3*  --  3.9  < > 4.2 3.7 3.9  CL 113*  < > 106 109  --  111  --   --   --   --   CO2 20*  < > 28 24  --  24  --   --   --   --   GLUCOSE 121*  < > 112* 132*  --  144*  --   --   --   --  BUN 21*  < > 10 13  < > 16  < > 12 36* 11  CREATININE 0.74  < > 0.57 0.62  < > 0.62  < > 0.6 0.8 0.5  CALCIUM 8.7*  < > 8.2* 8.2*  --  8.6*  --   --   --   --   MG 2.0  --   --  1.7  --   --   --   --   --   --   PHOS 3.3  --   --   --   --   --   --   --   --   --   < > = values in this interval not displayed.  Recent Labs  07/19/15 2121 07/21/15 0332 07/31/15 10/10/15 12/09/15  AST 25  --  25 12* 11*  ALT 26  --  15 12 9   ALKPHOS 107  --  90 109 110  BILITOT 0.7  --   --   --   --   PROT 7.2  --   --   --   --   ALBUMIN 4.2 3.2*  --   --   --     Recent Labs  07/19/15 2121  07/20/15 0450 07/21/15 0332  07/26/15 0407 07/27/15 0437  07/28/15 0237 07/31/15 11/26/15 12/09/15  WBC 5.9  --  6.8 9.3  < > 6.0 7.0  < > 7.7 5.9 11.8 6.3  NEUTROABS 2.7  --  5.4 6.4  --   --   --   --   --   --   --   --   HGB 14.4  < > 12.6 13.1  < > 14.1 13.4  --  14.0 13.8 11.8* 10.6*  HCT 44.0  < > 39.3 40.4  < > 41.4 41.9  --  42.1 43 36 33*  MCV 94.8  --  93.6 93.7  < > 92.8 93.5  --  94.6  --   --   --   PLT 138*  --  117* 122*  < > 117* 133*  --  145* 209 321 274  < > = values in this interval not displayed. Lab Results  Component Value Date   TSH 1.24 09/26/2015   Lab Results  Component Value Date   HGBA1C 7.7 01/01/2016   Lab Results  Component Value Date   CHOL 117 10/10/2015   HDL 50 10/10/2015   LDLCALC 56 10/10/2015   TRIG 57 10/10/2015   CHOLHDL 2.7 07/20/2015   Assessment/Plan Aphasia  Continue with speech therapy.   HTN  B/p stable. Continue lisinopril, Hydralazine and cardizem. BMP 10//2017  Afib Continue on Eliquis 5 mg tablet twice daily.   Hyperlipidemia  Continue  on Lipitor monitor Lipid panel.   Depression Stable. Continue to monitor for mood changes.   Protein malnutrition Continue to encourage oral intake. BMP 02/02/2016  Urinary Tract infections symptoms Has indwelling foley catheter draining adequate amounts of urine with cloudy color. Obtain U/A and C/S rule out UTI. Afebrile. Obtain CBC/diff.   Family/ staff Communication: Reviewed plan with facility Nurse supervisor.   Labs/tests ordered: CBC/diff ,BMP 02/02/2016

## 2016-02-02 LAB — CBC AND DIFFERENTIAL
HCT: 37 % (ref 36–46)
HEMOGLOBIN: 11.3 g/dL — AB (ref 12.0–16.0)
Platelets: 351 10*3/uL (ref 150–399)
WBC: 6.7 10*3/mL

## 2016-02-02 LAB — BASIC METABOLIC PANEL
BUN: 14 mg/dL (ref 4–21)
Creatinine: 0.6 mg/dL (ref 0.5–1.1)
GLUCOSE: 139 mg/dL
SODIUM: 142 mmol/L (ref 137–147)

## 2016-02-26 ENCOUNTER — Encounter: Payer: Self-pay | Admitting: Family

## 2016-02-26 ENCOUNTER — Non-Acute Institutional Stay (SKILLED_NURSING_FACILITY): Payer: PPO | Admitting: Family

## 2016-02-26 DIAGNOSIS — I480 Paroxysmal atrial fibrillation: Secondary | ICD-10-CM | POA: Diagnosis not present

## 2016-02-26 DIAGNOSIS — R1312 Dysphagia, oropharyngeal phase: Secondary | ICD-10-CM

## 2016-02-26 DIAGNOSIS — E876 Hypokalemia: Secondary | ICD-10-CM | POA: Diagnosis not present

## 2016-02-26 DIAGNOSIS — I10 Essential (primary) hypertension: Secondary | ICD-10-CM

## 2016-02-26 DIAGNOSIS — E782 Mixed hyperlipidemia: Secondary | ICD-10-CM

## 2016-02-26 NOTE — Progress Notes (Signed)
Location:  [place] Room Number: 301-B Place of Service:  SNF (31) Provider:  Dinah Ngetich FNP-C   FULP, CAMMIE, MD  Patient Care Team: Antony Blackbird, MD as PCP - General (Family Medicine)  Extended Emergency Contact Information Primary Emergency Contact: Slyter,Brianna Address: [address]          [place], [place] [address] Johnnette Litter of [place] Phone: [phone] Relation: [place] Secondary Emergency Contact: [place] Mobile Phone: [phone] Relation: Grandaughter  Code Status:  DNR Goals of care: Advanced Directive information Advanced Directives 01/29/2016  Does patient have an advance directive? Yes  Type of Advance Directive Out of facility DNR (pink MOST or yellow form)  Does patient want to make changes to advanced directive? -  Copy of advanced directive(s) in chart? Yes  Pre-existing out of facility DNR order (yellow form or pink MOST form) -     Chief Complaint  Patient presents with  . Medical Management of Chronic Issues    Routine Visit    HPI:  Pt is a 82 y.o. female seen today at [facility] for medical management of chronic diseases. She has a medical history of HTN, CVA, Afib, Depression among other conditions. She is seen in her room today. She has impaired speech but able to make needs known. She recently completed course of antibiotics for LLL pneumonia.she denies any acute issues this visit. No recent fall episodes or hospital visit.Facility staff reports no new concerns.     Past Medical History:  Diagnosis Date  . Asthma   . Cancer ([place])   . Dysphagia   . Hematuria   . Hypertension   . Hypokalemia   . PAF (paroxysmal atrial fibrillation) ([place])   . Stroke [facility])    Past Surgical History:  Procedure Laterality Date  . ABDOMINAL HYSTERECTOMY     PARTIAL  . APPENDECTOMY    . BREAST SURGERY    . CERVICAL SPINE SURGERY    . ECTOPIC  PREGNANCY SURGERY    . MYOMECTOMY    . NECK SURGERY     c-spine  . RADIOLOGY WITH ANESTHESIA N/A 07/19/2015   Procedure: RADIOLOGY WITH ANESTHESIA;  Surgeon: Luanne Bras, MD;  Location: [place];  Service: Radiology;  Laterality: N/A;    Allergies  Allergen Reactions  . Penicillins Rash    No other information available at this time  . Aspirin Other (See Comments)    Burning in stomach  . Morphine Other (See Comments)    delusions      Medication List       Accurate as of 02/26/16 11:28 AM. Always use your most recent med list.          acetaminophen 500 MG tablet Commonly known as:  TYLENOL Take 500 mg by mouth 2 (two) times daily.   acetaminophen 325 MG tablet Commonly known as:  TYLENOL Take 2 tablets every 4 hours as needed for pain   apixaban 5 MG Tabs tablet Commonly known as:  ELIQUIS Take 1 tablet (5 mg total) by mouth 2 (two) times daily.   atorvastatin 10 MG tablet Commonly known as:  LIPITOR Take 1 tablet (10 mg total) by mouth daily at 6 PM.   baclofen 10 MG tablet Commonly known as:  LIORESAL Take 10 mg by mouth 3 (three) times daily.   buPROPion 100 MG 12 hr tablet Commonly known as:  WELLBUTRIN SR Take 1 tablet (100  mg total) by mouth daily.   diltiazem 60 MG tablet Commonly known as:  CARDIZEM Take 1 tablet (60 mg total) by mouth every 8 (eight) hours.   docusate sodium 100 MG capsule Commonly known as:  COLACE Take 100 mg by mouth. Take one tablet twice daily   guaiFENesin 600 MG 12 hr tablet Commonly known as:  MUCINEX Take 600 mg by mouth every 12 (twelve) hours. Stop date 02/26/16   hydrALAZINE 25 MG tablet Commonly known as:  APRESOLINE Take one tablet twice daily. Hold for SBP<110   lisinopril 20 MG tablet Commonly known as:  PRINIVIL,ZESTRIL Take 20 mg by mouth daily.   multivitamin tablet Take 1 tablet by mouth daily.   polyethylene glycol packet Commonly known as:  MIRALAX / GLYCOLAX Take 17 g by mouth. Take every  other day for constipation   potassium chloride 20 MEQ/15ML (10%) Soln Take 15 mLs (20 mEq total) by mouth 2 (two) times daily.   UNABLE TO FIND Med Name: Med pass 120 mL 2 times daily between meals       Review of Systems  Unable to perform ROS: Other    Immunization History  Administered Date(s) Administered  . PPD Test 07/28/2015, 08/11/2015   Pertinent  Health Maintenance Due  Topic Date Due  . INFLUENZA VACCINE  11/25/2015  . DEXA SCAN  04/26/2017 (Originally 04/25/1992)  . PNA vac Low Risk Adult (1 of 2 - PCV13) 04/26/2017 (Originally 04/25/1992)   Fall Risk  09/30/2015  Falls in the past year? No      Vitals:   02/26/16 1120  BP: 131/72  Pulse: 70  Resp: 18  Temp: 98.1 F (36.7 C)  TempSrc: Oral  SpO2: 96%  Weight: 129 lb 3.2 oz (58.6 kg)  Height: 5\' 2"  (1.575 m)   Body mass index is 23.63 kg/m. Physical Exam  Constitutional: She appears well-developed and well-nourished.  Elderly in no acute distress.    HENT:  Head: Normocephalic.  Mouth/Throat: Oropharynx is clear and moist. No oropharyngeal exudate.  Aphasia   Eyes: Conjunctivae and EOM are normal. Pupils are equal, round, and reactive to light. Right eye exhibits no discharge. Left eye exhibits no discharge. No scleral icterus.  Neck: Normal range of motion. Neck supple. No JVD present. No thyromegaly present.  Cardiovascular: Normal rate, regular rhythm, normal heart sounds and intact distal pulses.  Exam reveals no gallop and no friction rub.   No murmur heard. Pulmonary/Chest: Effort normal and breath sounds normal. No respiratory distress. She has no wheezes. She has no rales.  Abdominal: Soft. Bowel sounds are normal. She exhibits no distension. There is no tenderness. There is no rebound and no guarding.  Genitourinary:  Genitourinary Comments: Foley Catheter draining adequates amount of yellow clear urine.   Musculoskeletal: She exhibits no edema or tenderness.  Right side hemiparalysis.  Wheelchair bound.   Lymphadenopathy:    She has no cervical adenopathy.  Neurological: She is alert.  Skin: Skin is warm and dry. No rash noted. No erythema. No pallor.  Psychiatric: She has a normal mood and affect.    Labs reviewed:  Recent Labs  07/21/15 0332  07/26/15 0407 07/27/15 0437  07/28/15 0237  08/27/15 11/26/15 12/09/15 02/02/16  NA 144  < > 142 142  < > 143  < > 142 146 141 142  K 4.0  < > 2.5* 3.3*  --  3.9  < > 4.2 3.7 3.9  --   CL 113*  < >  106 109  --  111  --   --   --   --   --   CO2 20*  < > 28 24  --  24  --   --   --   --   --   GLUCOSE 121*  < > 112* 132*  --  144*  --   --   --   --   --   BUN 21*  < > 10 13  < > 16  < > 12 36* 11 14  CREATININE 0.74  < > 0.57 0.62  < > 0.62  < > 0.6 0.8 0.5 0.6  CALCIUM 8.7*  < > 8.2* 8.2*  --  8.6*  --   --   --   --   --   MG 2.0  --   --  1.7  --   --   --   --   --   --   --   PHOS 3.3  --   --   --   --   --   --   --   --   --   --   < > = values in this interval not displayed.  Recent Labs  07/19/15 2121 07/21/15 0332 07/31/15 10/10/15 12/09/15  AST 25  --  25 12* 11*  ALT 26  --  15 12 9   ALKPHOS 107  --  90 109 110  BILITOT 0.7  --   --   --   --   PROT 7.2  --   --   --   --   ALBUMIN 4.2 3.2*  --   --   --     Recent Labs  07/19/15 2121  07/20/15 0450 07/21/15 0332  07/26/15 0407 07/27/15 0437  07/28/15 0237  11/26/15 12/09/15 02/02/16  WBC 5.9  --  6.8 9.3  < > 6.0 7.0  < > 7.7  < > 11.8 6.3 6.7  NEUTROABS 2.7  --  5.4 6.4  --   --   --   --   --   --   --   --   --   HGB 14.4  < > 12.6 13.1  < > 14.1 13.4  --  14.0  < > 11.8* 10.6* 11.3*  HCT 44.0  < > 39.3 40.4  < > 41.4 41.9  --  42.1  < > 36 33* 37  MCV 94.8  --  93.6 93.7  < > 92.8 93.5  --  94.6  --   --   --   --   PLT 138*  --  117* 122*  < > 117* 133*  --  145*  < > 321 274 351  < > = values in this interval not displayed. Lab Results  Component Value Date   TSH 1.24 09/26/2015   Lab Results  Component Value Date   HGBA1C 7.7  01/01/2016   Lab Results  Component Value Date   CHOL 117 10/10/2015   HDL 50 10/10/2015   LDLCALC 56 10/10/2015   TRIG 57 10/10/2015   CHOLHDL 2.7 07/20/2015    Assessment/Plan HTN B/p stable.continue on Hydralazine, Diltiazem and Lisinopril . BMP 02/27/2016.   Afib HR controlled. Continue on Diltiazem and EliQuis.   Dysphagia  Continue with aspiration precautions.   Hyperlipidemia  Continue on Lipitor.   Hypokalemia Continue on Potassium supplements.Recent BMP 02/02/2016 lab without any K+ results will repeat  BMP 02/27/2016.   Family/ staff Communication: Reviewed plan of care with patient and facility Nurse supervisor.   Labs/tests ordered:  BMP 02/27/2016.

## 2016-02-27 LAB — BASIC METABOLIC PANEL
BUN: 20 mg/dL (ref 4–21)
CREATININE: 0.5 mg/dL (ref 0.5–1.1)
Glucose: 141 mg/dL
POTASSIUM: 3.6 mmol/L (ref 3.4–5.3)
SODIUM: 146 mmol/L (ref 137–147)

## 2016-03-30 ENCOUNTER — Non-Acute Institutional Stay (SKILLED_NURSING_FACILITY): Payer: PPO | Admitting: Family

## 2016-03-30 DIAGNOSIS — F329 Major depressive disorder, single episode, unspecified: Secondary | ICD-10-CM | POA: Diagnosis not present

## 2016-03-30 DIAGNOSIS — G8110 Spastic hemiplegia affecting unspecified side: Secondary | ICD-10-CM | POA: Diagnosis not present

## 2016-03-30 DIAGNOSIS — E782 Mixed hyperlipidemia: Secondary | ICD-10-CM | POA: Diagnosis not present

## 2016-03-30 DIAGNOSIS — I480 Paroxysmal atrial fibrillation: Secondary | ICD-10-CM | POA: Diagnosis not present

## 2016-03-30 DIAGNOSIS — F32A Depression, unspecified: Secondary | ICD-10-CM

## 2016-03-30 DIAGNOSIS — I10 Essential (primary) hypertension: Secondary | ICD-10-CM | POA: Diagnosis not present

## 2016-03-30 DIAGNOSIS — R1312 Dysphagia, oropharyngeal phase: Secondary | ICD-10-CM | POA: Diagnosis not present

## 2016-03-30 NOTE — Progress Notes (Signed)
Location:  [place] Room Number: 301B  Place of Service:  SNF (31) Provider: Korina Tretter FNP-C   FULP, CAMMIE, MD  Patient Care Team: Antony Blackbird, MD as PCP - General (Family Medicine)  Extended Emergency Contact Information Primary Emergency Contact: Kearse,Brianna Address: [address]          [place], [place] [address] Johnnette Litter of [place] Phone: [phone] Relation: [place] Secondary Emergency Contact: [place] of [place] Mobile Phone: [phone] Relation: Grandaughter  Code Status:  DNR  Goals of care: Advanced Directive information Advanced Directives 01/29/2016  Does Patient Have a Medical Advance Directive? Yes  Type of Advance Directive Out of facility DNR (pink MOST or yellow form)  Does patient want to make changes to medical advance directive? -  Copy of [place] in Chart? Yes  Pre-existing out of facility DNR order (yellow form or pink MOST form) -     Chief Complaint  Patient presents with  . Medical Management of Chronic Issues    HPI:  Pt is a 82 y.o. female seen today at [facility]  for medical management of chronic diseases.She has a medical history of HTN, CVA, Afib,Aphasia, Hyperlipidemia among other conditions. She is seen in her room today with family member at bedside. She denies any acute issues this visit. She has had no recent treatment for UTI's. No weight changes or hospital admission. Facility Nurse reports no new concerns. She is currently following up with facility [facility] for aphasia.    Past Medical History:  Diagnosis Date  . Asthma   . Cancer ([place])   . Dysphagia   . Hematuria   . Hypertension   . Hypokalemia   . PAF (paroxysmal atrial fibrillation) ([place])   . Stroke [facility])    Past Surgical History:  Procedure Laterality Date  . ABDOMINAL HYSTERECTOMY     PARTIAL  . APPENDECTOMY    . BREAST SURGERY    . CERVICAL  SPINE SURGERY    . ECTOPIC PREGNANCY SURGERY    . MYOMECTOMY    . NECK SURGERY     c-spine  . RADIOLOGY WITH ANESTHESIA N/A 07/19/2015   Procedure: RADIOLOGY WITH ANESTHESIA;  Surgeon: Luanne Bras, MD;  Location: [place];  Service: Radiology;  Laterality: N/A;    Allergies  Allergen Reactions  . Penicillins Rash    No other information available at this time  . Aspirin Other (See Comments)    Burning in stomach  . Morphine Other (See Comments)    delusions      Medication List       Accurate as of 03/30/16  7:12 PM. Always use your most recent med list.          acetaminophen 500 MG tablet Commonly known as:  TYLENOL Take 500 mg by mouth 2 (two) times daily.   apixaban 5 MG Tabs tablet Commonly known as:  ELIQUIS Take 1 tablet (5 mg total) by mouth 2 (two) times daily.   atorvastatin 10 MG tablet Commonly known as:  LIPITOR Take 1 tablet (10 mg total) by mouth daily at 6 PM.   baclofen 10 MG tablet Commonly known as:  LIORESAL Take 10 mg by mouth 3 (three) times daily.   buPROPion 100 MG 12 hr tablet Commonly known as:  WELLBUTRIN SR Take 1 tablet (100 mg total) by mouth daily.   diltiazem 60 MG tablet Commonly known as:  CARDIZEM  Take 1 tablet (60 mg total) by mouth every 8 (eight) hours.   docusate sodium 100 MG capsule Commonly known as:  COLACE Take 100 mg by mouth. Take one tablet twice daily   guaiFENesin 600 MG 12 hr tablet Commonly known as:  MUCINEX Take 600 mg by mouth every 12 (twelve) hours. Stop date 02/26/16   hydrALAZINE 25 MG tablet Commonly known as:  APRESOLINE Take one tablet twice daily. Hold for SBP<110   lisinopril 20 MG tablet Commonly known as:  PRINIVIL,ZESTRIL Take 20 mg by mouth daily.   multivitamin tablet Take 1 tablet by mouth daily.   polyethylene glycol packet Commonly known as:  MIRALAX / GLYCOLAX Take 17 g by mouth. Take every other day for constipation   potassium chloride 20 MEQ/15ML (10%) Soln Take 15  mLs (20 mEq total) by mouth 2 (two) times daily.   UNABLE TO FIND Med Name: Med pass 120 mL 2 times daily between meals       Review of Systems  Unable to perform ROS: Other    Immunization History  Administered Date(s) Administered  . PPD Test 07/28/2015, 08/11/2015   Pertinent  Health Maintenance Due  Topic Date Due  . INFLUENZA VACCINE  11/25/2015  . DEXA SCAN  04/26/2017 (Originally 04/25/1992)  . PNA vac Low Risk Adult (1 of 2 - PCV13) 04/26/2017 (Originally 04/25/1992)   Fall Risk  09/30/2015  Falls in the past year? No   Functional Status Survey:    Vitals:   03/30/16 1100  BP: 137/88  Pulse: 72  Resp: 18  Temp: 97 F (36.1 C)  SpO2: 98%  Weight: 134 lb (60.8 kg)  Height: 5\' 2"  (1.575 m)   Body mass index is 24.51 kg/m. Physical Exam  Constitutional: She appears well-developed and well-nourished. No distress.  Elderly  HENT:  Head: Normocephalic.  Mouth/Throat: Oropharynx is clear and moist. No oropharyngeal exudate.  Aphasia though makes needs known with prolong struggle.   Eyes: Conjunctivae and EOM are normal. Pupils are equal, round, and reactive to light. Right eye exhibits no discharge. Left eye exhibits no discharge. No scleral icterus.  Neck: Normal range of motion. Neck supple. No JVD present. No thyromegaly present.  Cardiovascular: Normal rate, regular rhythm, normal heart sounds and intact distal pulses.  Exam reveals no gallop and no friction rub.   No murmur heard. Pulmonary/Chest: Effort normal and breath sounds normal. No respiratory distress. She has no wheezes. She has no rales.  Abdominal: Soft. Bowel sounds are normal. She exhibits no distension. There is no tenderness. There is no rebound and no guarding.  Genitourinary:  Genitourinary Comments: Foley Catheter draining adequates amount of yellow clear urine.   Musculoskeletal: She exhibits no edema or tenderness.  Right side hemiparalysis. Wheelchair bound.   Lymphadenopathy:    She  has no cervical adenopathy.  Neurological: She is alert.  Aphasia. Right side weakness. Spasm with movement.   Skin: Skin is warm and dry. No rash noted. No erythema. No pallor.  Psychiatric: She has a normal mood and affect.    Labs reviewed:  Recent Labs  07/21/15 0332  07/26/15 0407 07/27/15 0437  07/28/15 0237  08/27/15 11/26/15 12/09/15 02/02/16  NA 144  < > 142 142  < > 143  < > 142 146 141 142  K 4.0  < > 2.5* 3.3*  --  3.9  < > 4.2 3.7 3.9  --   CL 113*  < > 106 109  --  111  --   --   --   --   --  CO2 20*  < > 28 24  --  24  --   --   --   --   --   GLUCOSE 121*  < > 112* 132*  --  144*  --   --   --   --   --   BUN 21*  < > 10 13  < > 16  < > 12 36* 11 14  CREATININE 0.74  < > 0.57 0.62  < > 0.62  < > 0.6 0.8 0.5 0.6  CALCIUM 8.7*  < > 8.2* 8.2*  --  8.6*  --   --   --   --   --   MG 2.0  --   --  1.7  --   --   --   --   --   --   --   PHOS 3.3  --   --   --   --   --   --   --   --   --   --   < > = values in this interval not displayed.  Recent Labs  07/19/15 2121 07/21/15 0332 07/31/15 10/10/15 12/09/15  AST 25  --  25 12* 11*  ALT 26  --  15 12 9   ALKPHOS 107  --  90 109 110  BILITOT 0.7  --   --   --   --   PROT 7.2  --   --   --   --   ALBUMIN 4.2 3.2*  --   --   --     Recent Labs  07/19/15 2121  07/20/15 0450 07/21/15 0332  07/26/15 0407 07/27/15 0437  07/28/15 0237  11/26/15 12/09/15 02/02/16  WBC 5.9  --  6.8 9.3  < > 6.0 7.0  < > 7.7  < > 11.8 6.3 6.7  NEUTROABS 2.7  --  5.4 6.4  --   --   --   --   --   --   --   --   --   HGB 14.4  < > 12.6 13.1  < > 14.1 13.4  --  14.0  < > 11.8* 10.6* 11.3*  HCT 44.0  < > 39.3 40.4  < > 41.4 41.9  --  42.1  < > 36 33* 37  MCV 94.8  --  93.6 93.7  < > 92.8 93.5  --  94.6  --   --   --   --   PLT 138*  --  117* 122*  < > 117* 133*  --  145*  < > 321 274 351  < > = values in this interval not displayed. Lab Results  Component Value Date   TSH 1.24 09/26/2015   Lab Results  Component Value Date    HGBA1C 7.7 01/01/2016   Lab Results  Component Value Date   CHOL 117 10/10/2015   HDL 50 10/10/2015   LDLCALC 56 10/10/2015   TRIG 57 10/10/2015   CHOLHDL 2.7 07/20/2015   Assessment/Plan 1. Essential hypertension B/p stable. Continue on lisinopril, Hydralazine and Diltiazem. Monitor BMP.   2. Paroxysmal atrial fibrillation (HCC) Continue on EliQuis 5 mg tablet twice daily.   3. Oropharyngeal dysphagia Continue to follow up with facility [facility]. Aspiration precaution.   4. Mixed hyperlipidemia Continue on Lipitor. Lipid panel 03/31/2016.   5. Spastic hemiplegia affecting nondominant side (HCC) Continue on Baclofen 10 mg tablet three times daily.   6. Depression,  unspecified depression type Stable. Continue on Wellbutrin. TSH level 03/31/2016.    Family/ staff Communication: Reviewed plan of care with patient and facility Nurse supervisor.   Labs/tests ordered:  Lipid panel, Hg b A1C and TSH level 03/31/2016

## 2016-03-31 LAB — LIPID PANEL
CHOLESTEROL: 116 mg/dL (ref 0–200)
HDL: 48 mg/dL (ref 35–70)
LDL Cholesterol: 56 mg/dL
TRIGLYCERIDES: 62 mg/dL (ref 40–160)

## 2016-03-31 LAB — HEMOGLOBIN A1C: HEMOGLOBIN A1C: 7.5

## 2016-03-31 LAB — TSH: TSH: 1.37 u[IU]/mL (ref 0.41–5.90)

## 2016-04-29 ENCOUNTER — Encounter: Payer: Self-pay | Admitting: Family

## 2016-04-29 ENCOUNTER — Non-Acute Institutional Stay (SKILLED_NURSING_FACILITY): Payer: Medicare Other | Admitting: Family

## 2016-04-29 DIAGNOSIS — I480 Paroxysmal atrial fibrillation: Secondary | ICD-10-CM | POA: Diagnosis not present

## 2016-04-29 DIAGNOSIS — E782 Mixed hyperlipidemia: Secondary | ICD-10-CM

## 2016-04-29 DIAGNOSIS — G8191 Hemiplegia, unspecified affecting right dominant side: Secondary | ICD-10-CM

## 2016-04-29 DIAGNOSIS — R1312 Dysphagia, oropharyngeal phase: Secondary | ICD-10-CM | POA: Diagnosis not present

## 2016-04-29 DIAGNOSIS — I10 Essential (primary) hypertension: Secondary | ICD-10-CM | POA: Diagnosis not present

## 2016-04-29 NOTE — Progress Notes (Signed)
Location:  [place] Room Number: 301B Place of Service:  SNF (31) Provider:  Marlowe Sax, FNP-C   Blanchie Serve, MD  Patient Care Team: Blanchie Serve, MD as PCP - General (Internal Medicine)  Extended Emergency Contact Information Primary Emergency Contact: Sobolewski,Brianna Address: [address]          [place], [place] [address] Johnnette Litter of [place] Phone: [phone] Relation: [place] Secondary Emergency Contact: [place] of [place] Mobile Phone: [phone] Relation: Grandaughter  Code Status:  DNR Goals of care: Advanced Directive information Advanced Directives 04/29/2016  Does Patient Have a Medical Advance Directive? Yes  Type of Paramedic of [place];Out of facility DNR (pink MOST or yellow form)  Does patient want to make changes to medical advance directive? -  Copy of [place] in Chart? Yes  Pre-existing out of facility DNR order (yellow form or pink MOST form) Yellow form placed in chart (order not valid for inpatient use)     Chief Complaint  Patient presents with  . Medical Management of Chronic Issues    routine visit    HPI:  Pt is a 83 y.o. female seen today at [facility] for medical management of chronic diseases. She has a medical history of  HTN, Afib, CVA, hyperlipidemia among other conditions. She is seen in her room today. She denies any acute issues this visit. Facility Nurse reports no new concerns. No recent weight changes or fall episodes. Observed feeding self this visit but requires assistance with ADL's.    Past Medical History:  Diagnosis Date  . Asthma   . Cancer ([place])   . Dysphagia   . Hematuria   . Hypertension   . Hypokalemia   . PAF (paroxysmal atrial fibrillation) ([place])   . Stroke [facility])    Past Surgical History:  Procedure Laterality Date  . ABDOMINAL HYSTERECTOMY     PARTIAL  .  APPENDECTOMY    . BREAST SURGERY    . CERVICAL SPINE SURGERY    . ECTOPIC PREGNANCY SURGERY    . MYOMECTOMY    . NECK SURGERY     c-spine  . RADIOLOGY WITH ANESTHESIA N/A 07/19/2015   Procedure: RADIOLOGY WITH ANESTHESIA;  Surgeon: Luanne Bras, MD;  Location: [place];  Service: Radiology;  Laterality: N/A;    Allergies  Allergen Reactions  . Penicillins Rash    No other information available at this time  . Aspirin Other (See Comments)    Burning in stomach  . Morphine Other (See Comments)    delusions    Allergies as of 04/29/2016      Reactions   Penicillins Rash   No other information available at this time   Aspirin Other (See Comments)   Burning in stomach   Morphine Other (See Comments)   delusions      Medication List       Accurate as of 04/29/16 11:15 AM. Always use your most recent med list.          acetaminophen 500 MG tablet Commonly known as:  TYLENOL Take 500 mg by mouth 2 (two) times daily.   apixaban 5 MG Tabs tablet Commonly known as:  ELIQUIS Take 1 tablet (5 mg total) by mouth 2 (two) times daily.   atorvastatin 10 MG tablet Commonly known as:  LIPITOR Take 1 tablet (10 mg total) by mouth daily at 6 PM.   baclofen  10 MG tablet Commonly known as:  LIORESAL Take 10 mg by mouth 3 (three) times daily.   buPROPion 100 MG 12 hr tablet Commonly known as:  WELLBUTRIN SR Take 1 tablet (100 mg total) by mouth daily.   diltiazem 60 MG tablet Commonly known as:  CARDIZEM Take 1 tablet (60 mg total) by mouth every 8 (eight) hours.   docusate sodium 100 MG capsule Commonly known as:  COLACE Take 100 mg by mouth. Take one tablet twice daily   guaiFENesin 600 MG 12 hr tablet Commonly known as:  MUCINEX Take 600 mg by mouth every 12 (twelve) hours. Stop date 02/26/16   hydrALAZINE 25 MG tablet Commonly known as:  APRESOLINE Take one tablet twice daily. Hold for SBP<110   lisinopril 20 MG tablet Commonly known as:  PRINIVIL,ZESTRIL Take 20  mg by mouth daily.   multivitamin tablet Take 1 tablet by mouth daily.   polyethylene glycol packet Commonly known as:  MIRALAX / GLYCOLAX Take 17 g by mouth. Take every other day for constipation   potassium chloride 20 MEQ/15ML (10%) Soln Take 15 mLs (20 mEq total) by mouth 2 (two) times daily.   UNABLE TO FIND Med Name: Med pass 120 mL 2 times daily between meals       Review of Systems  Unable to perform ROS: Other    Immunization History  Administered Date(s) Administered  . PPD Test 07/28/2015, 08/11/2015   Pertinent  Health Maintenance Due  Topic Date Due  . INFLUENZA VACCINE  11/25/2015  . DEXA SCAN  04/26/2017 (Originally 04/25/1992)  . PNA vac Low Risk Adult (1 of 2 - PCV13) 04/26/2017 (Originally 04/25/1992)   Fall Risk  09/30/2015  Falls in the past year? No    Vitals:   04/29/16 1110  BP: 131/76  Pulse: 85  Resp: 18  Temp: 98.8 F (37.1 C)  SpO2: 94%  Weight: 134 lb (60.8 kg)  Height: 5\' 2"  (1.575 m)   Body mass index is 24.51 kg/m. Physical Exam  Constitutional: She appears well-developed and well-nourished.  Elderly in no acute distress.   HENT:  Head: Normocephalic.  Mouth/Throat: Oropharynx is clear and moist. No oropharyngeal exudate.  Aphasia though makes needs known  Eyes: Conjunctivae and EOM are normal. Pupils are equal, round, and reactive to light. Right eye exhibits no discharge. Left eye exhibits no discharge. No scleral icterus.  Neck: Normal range of motion. Neck supple. No JVD present. No thyromegaly present.  Cardiovascular: Normal rate, regular rhythm, normal heart sounds and intact distal pulses.  Exam reveals no gallop and no friction rub.   No murmur heard. Pulmonary/Chest: Effort normal and breath sounds normal. No respiratory distress. She has no wheezes. She has no rales.  Abdominal: Soft. Bowel sounds are normal. She exhibits no distension. There is no tenderness. There is no rebound and no guarding.  Genitourinary:    Genitourinary Comments: Foley Catheter draining adequates amount of yellow clear urine.   Musculoskeletal: She exhibits no edema or tenderness.  Right side hemiparalysis. Wheelchair bound.   Lymphadenopathy:    She has no cervical adenopathy.  Neurological: She is alert.  Aphasia. Right side weakness.  Skin: Skin is warm and dry. No rash noted. No erythema. No pallor.  No skin break down  Psychiatric: She has a normal mood and affect.    Labs reviewed:  Recent Labs  07/21/15 0332  07/26/15 0407 07/27/15 0437  07/28/15 0237  08/27/15 11/26/15 12/09/15 02/02/16  NA 144  < >  142 142  < > 143  < > 142 146 141 142  K 4.0  < > 2.5* 3.3*  --  3.9  < > 4.2 3.7 3.9  --   CL 113*  < > 106 109  --  111  --   --   --   --   --   CO2 20*  < > 28 24  --  24  --   --   --   --   --   GLUCOSE 121*  < > 112* 132*  --  144*  --   --   --   --   --   BUN 21*  < > 10 13  < > 16  < > 12 36* 11 14  CREATININE 0.74  < > 0.57 0.62  < > 0.62  < > 0.6 0.8 0.5 0.6  CALCIUM 8.7*  < > 8.2* 8.2*  --  8.6*  --   --   --   --   --   MG 2.0  --   --  1.7  --   --   --   --   --   --   --   PHOS 3.3  --   --   --   --   --   --   --   --   --   --   < > = values in this interval not displayed.  Recent Labs  07/19/15 2121 07/21/15 0332 07/31/15 10/10/15 12/09/15  AST 25  --  25 12* 11*  ALT 26  --  15 12 9   ALKPHOS 107  --  90 109 110  BILITOT 0.7  --   --   --   --   PROT 7.2  --   --   --   --   ALBUMIN 4.2 3.2*  --   --   --     Recent Labs  07/19/15 2121  07/20/15 0450 07/21/15 0332  07/26/15 0407 07/27/15 0437  07/28/15 0237  11/26/15 12/09/15 02/02/16  WBC 5.9  --  6.8 9.3  < > 6.0 7.0  < > 7.7  < > 11.8 6.3 6.7  NEUTROABS 2.7  --  5.4 6.4  --   --   --   --   --   --   --   --   --   HGB 14.4  < > 12.6 13.1  < > 14.1 13.4  --  14.0  < > 11.8* 10.6* 11.3*  HCT 44.0  < > 39.3 40.4  < > 41.4 41.9  --  42.1  < > 36 33* 37  MCV 94.8  --  93.6 93.7  < > 92.8 93.5  --  94.6  --   --   --   --    PLT 138*  --  117* 122*  < > 117* 133*  --  145*  < > 321 274 351  < > = values in this interval not displayed. Lab Results  Component Value Date   TSH 1.24 09/26/2015   Lab Results  Component Value Date   HGBA1C 7.7 01/01/2016   Lab Results  Component Value Date   CHOL 117 10/10/2015   HDL 50 10/10/2015   LDLCALC 56 10/10/2015   TRIG 57 10/10/2015   CHOLHDL 2.7 07/20/2015   Assessment/Plan 1. Essential hypertension B/p stable. Continue on lisinopril, Hydralazine and Diltiazem. Monitor BMP.  2. Paroxysmal atrial fibrillation  Continue on EliQuis 5 mg tablet twice daily.   3. Oropharyngeal dysphagia Stable.Continue to follow up with facility [facility]. Aspiration precaution.   4. Mixed hyperlipidemia  Lipid panel 03/31/2016 within range. Will decrease Lipitor to 5 mg Tablet. Recheck lipid panel in 3 months.   5. Right Hemiplegia  Continue to assist with ADL's.   Family/ staff Communication:   Labs/tests ordered:  lipid panel in 3 months.

## 2016-05-31 ENCOUNTER — Encounter: Payer: Self-pay | Admitting: Family

## 2016-05-31 ENCOUNTER — Non-Acute Institutional Stay (SKILLED_NURSING_FACILITY): Payer: Medicare Other | Admitting: Family

## 2016-05-31 DIAGNOSIS — F32A Depression, unspecified: Secondary | ICD-10-CM

## 2016-05-31 DIAGNOSIS — I10 Essential (primary) hypertension: Secondary | ICD-10-CM | POA: Diagnosis not present

## 2016-05-31 DIAGNOSIS — I480 Paroxysmal atrial fibrillation: Secondary | ICD-10-CM | POA: Diagnosis not present

## 2016-05-31 DIAGNOSIS — F329 Major depressive disorder, single episode, unspecified: Secondary | ICD-10-CM

## 2016-05-31 NOTE — Progress Notes (Signed)
Location:  [facility] Room Number: 301-B Place of Service:  SNF (31) Provider:  Marlowe Sax, FNP-C   Blanchie Serve, MD  Patient Care Team: Blanchie Serve, MD as PCP - General (Internal Medicine)  Extended Emergency Contact Information Primary Emergency Contact: Stoermer,Brianna Address: [address]          [place], [place] [address] Johnnette Litter of [place] Phone: [phone] Relation: [place] Secondary Emergency Contact: [place] of [place] Mobile Phone: [phone] Relation: Grandaughter  Code Status:  DNR Goals of care: Advanced Directive information Advanced Directives 04/29/2016  Does Patient Have a Medical Advance Directive? Yes  Type of Paramedic of [place];Out of facility DNR (pink MOST or yellow form)  Does patient want to make changes to medical advance directive? -  Copy of [place] in Chart? Yes  Pre-existing out of facility DNR order (yellow form or pink MOST form) Yellow form placed in chart (order not valid for inpatient use)     Chief Complaint  Patient presents with  . Medical Management of Chronic Issues    Routine Visit     HPI:  Pt is a 83 y.o. female seen today at [facility] for medical management of chronic diseases. She has a medical history of  HTN, Afib, CVA with right side hemiplegia, Dysphagia, hyperlipidemia, Thrombocytopenia, depression,  among other conditions. She is seen in her room today. She denies any acute issues this visit.Blood pressure log ranging in the 130's/90's-180's/90's Facility Nurse reports no new concerns. She has had no recent weight changes or fall episodes.No recent illness reported.  Past Medical History:  Diagnosis Date  . Asthma   . Cancer ([place])   . Dysphagia   . Hematuria   . Hypertension   . Hypokalemia   . PAF (paroxysmal atrial fibrillation) ([place])   . Stroke [facility])    Past  Surgical History:  Procedure Laterality Date  . ABDOMINAL HYSTERECTOMY     PARTIAL  . APPENDECTOMY    . BREAST SURGERY    . CERVICAL SPINE SURGERY    . ECTOPIC PREGNANCY SURGERY    . MYOMECTOMY    . NECK SURGERY     c-spine  . RADIOLOGY WITH ANESTHESIA N/A 07/19/2015   Procedure: RADIOLOGY WITH ANESTHESIA;  Surgeon: Luanne Bras, MD;  Location: [place];  Service: Radiology;  Laterality: N/A;    Allergies  Allergen Reactions  . Penicillins Rash    No other information available at this time  . Aspirin Other (See Comments)    Burning in stomach  . Morphine Other (See Comments)    delusions    Allergies as of 05/31/2016      Reactions   Penicillins Rash   No other information available at this time   Aspirin Other (See Comments)   Burning in stomach   Morphine Other (See Comments)   delusions      Medication List       Accurate as of 05/31/16  7:53 PM. Always use your most recent med list.          acetaminophen 500 MG tablet Commonly known as:  TYLENOL Take 500 mg by mouth 2 (two) times daily.   acetaminophen 325 MG tablet Commonly known as:  TYLENOL Take 650 mg by mouth every 4 (four) hours as needed for mild pain.   apixaban 5 MG Tabs tablet Commonly known as:  ELIQUIS Take 1 tablet (  5 mg total) by mouth 2 (two) times daily.   atorvastatin 10 MG tablet Commonly known as:  LIPITOR Take 1 tablet (10 mg total) by mouth daily at 6 PM.   baclofen 10 MG tablet Commonly known as:  LIORESAL Take 10 mg by mouth 3 (three) times daily.   buPROPion 100 MG 12 hr tablet Commonly known as:  WELLBUTRIN SR Take 1 tablet (100 mg total) by mouth daily.   diltiazem 60 MG tablet Commonly known as:  CARDIZEM Take 1 tablet (60 mg total) by mouth every 8 (eight) hours.   docusate 50 MG/5ML liquid Commonly known as:  COLACE Take by mouth daily. Give 10 mL   hydrALAZINE 25 MG tablet Commonly known as:  APRESOLINE Take one tablet twice daily. Hold for SBP<110     lisinopril 20 MG tablet Commonly known as:  PRINIVIL,ZESTRIL Take 20 mg by mouth daily.   multivitamin tablet Take 1 tablet by mouth daily.   polyethylene glycol packet Commonly known as:  MIRALAX / GLYCOLAX Take 17 g by mouth. Take every other day for constipation   potassium chloride 20 MEQ/15ML (10%) Soln Take 15 mLs (20 mEq total) by mouth 2 (two) times daily.   UNABLE TO FIND Med Name: Med pass 120 mL 2 times daily between meals       Review of Systems  Unable to perform ROS: Other    Immunization History  Administered Date(s) Administered  . PPD Test 07/28/2015, 08/11/2015   Pertinent  Health Maintenance Due  Topic Date Due  . INFLUENZA VACCINE  11/25/2015  . DEXA SCAN  04/26/2017 (Originally 04/25/1992)  . PNA vac Low Risk Adult (1 of 2 - PCV13) 04/26/2017 (Originally 04/25/1992)   Fall Risk  09/30/2015  Falls in the past year? No    Vitals:   05/31/16 1055  BP: (!) 141/74  Pulse: 64  Resp: 20  Temp: 97.8 F (36.6 C)  TempSrc: Oral  SpO2: 95%  Weight: 133 lb (60.3 kg)  Height: 5\' 2"  (1.575 m)   Body mass index is 24.33 kg/m. Physical Exam  Constitutional: She appears well-developed and well-nourished.  Elderly   HENT:  Head: Normocephalic.  Mouth/Throat: Oropharynx is clear and moist. No oropharyngeal exudate.  Aphasia though takes awhile to able to make needs known   Eyes: Conjunctivae and EOM are normal. Pupils are equal, round, and reactive to light. Right eye exhibits no discharge. Left eye exhibits no discharge. No scleral icterus.  Neck: Normal range of motion. Neck supple. No JVD present. No thyromegaly present.  Cardiovascular: Normal rate, regular rhythm, normal heart sounds and intact distal pulses.  Exam reveals no gallop and no friction rub.   No murmur heard. Pulmonary/Chest: Effort normal and breath sounds normal. No respiratory distress. She has no wheezes. She has no rales.  Abdominal: Soft. Bowel sounds are normal. She exhibits  no distension. There is no tenderness. There is no rebound and no guarding.  Genitourinary:  Genitourinary Comments: Foley Catheter draining yellow clear urine.   Musculoskeletal: She exhibits no edema or tenderness.  Right side hemiparalysis.  Lymphadenopathy:    She has no cervical adenopathy.  Neurological: She is alert.  Aphasia. Right side weakness.  Skin: Skin is warm and dry. No rash noted. No erythema. No pallor.  Skin intact   Psychiatric: She has a normal mood and affect.    Labs reviewed:  Recent Labs  07/21/15 0332  07/26/15 0407 07/27/15 0437  07/28/15 0237  11/26/15 12/09/15 02/02/16 02/27/16  NA 144  < > 142 142  < > 143  < > 146 141 142 146  K 4.0  < > 2.5* 3.3*  --  3.9  < > 3.7 3.9  --  3.6  CL 113*  < > 106 109  --  111  --   --   --   --   --   CO2 20*  < > 28 24  --  24  --   --   --   --   --   GLUCOSE 121*  < > 112* 132*  --  144*  --   --   --   --   --   BUN 21*  < > 10 13  < > 16  < > 36* 11 14 20   CREATININE 0.74  < > 0.57 0.62  < > 0.62  < > 0.8 0.5 0.6 0.5  CALCIUM 8.7*  < > 8.2* 8.2*  --  8.6*  --   --   --   --   --   MG 2.0  --   --  1.7  --   --   --   --   --   --   --   PHOS 3.3  --   --   --   --   --   --   --   --   --   --   < > = values in this interval not displayed.  Recent Labs  07/19/15 2121 07/21/15 0332 07/31/15 10/10/15 12/09/15  AST 25  --  25 12* 11*  ALT 26  --  15 12 9   ALKPHOS 107  --  90 109 110  BILITOT 0.7  --   --   --   --   PROT 7.2  --   --   --   --   ALBUMIN 4.2 3.2*  --   --   --     Recent Labs  07/19/15 2121  07/20/15 0450 07/21/15 0332  07/26/15 0407 07/27/15 0437  07/28/15 0237  11/26/15 12/09/15 02/02/16  WBC 5.9  --  6.8 9.3  < > 6.0 7.0  < > 7.7  < > 11.8 6.3 6.7  NEUTROABS 2.7  --  5.4 6.4  --   --   --   --   --   --   --   --   --   HGB 14.4  < > 12.6 13.1  < > 14.1 13.4  --  14.0  < > 11.8* 10.6* 11.3*  HCT 44.0  < > 39.3 40.4  < > 41.4 41.9  --  42.1  < > 36 33* 37  MCV 94.8  --  93.6  93.7  < > 92.8 93.5  --  94.6  --   --   --   --   PLT 138*  --  117* 122*  < > 117* 133*  --  145*  < > 321 274 351  < > = values in this interval not displayed. Lab Results  Component Value Date   TSH 1.37 03/31/2016   Lab Results  Component Value Date   HGBA1C 7.5 03/31/2016   Lab Results  Component Value Date   CHOL 116 03/31/2016   HDL 48 03/31/2016   LDLCALC 56 03/31/2016   TRIG 62 03/31/2016   CHOLHDL 2.7 07/20/2015   Assessment/Plan 1. Essential hypertension Blood pressure log ranging in  the 130's/90's-180's/90's. Continue on lisinopril and Diltiazem.Will increase Hydralazine to 25 mg Tablet three times daily.Monitor B/P and HR every shift x 1 week then resume previous. Notify provide if SBP > 150 Monitor BMP.   2. Atrial fibrillation  Continue on EliQuis 5 mg tablet twice daily.   3. Depression  Stable.continue on bupropion.Monitor for mood changes.Continue to follow up with Psych service.   Family/ staff Communication:   Labs/tests ordered:  None

## 2016-06-25 ENCOUNTER — Non-Acute Institutional Stay (SKILLED_NURSING_FACILITY): Payer: Medicare Other | Admitting: Family

## 2016-06-25 ENCOUNTER — Encounter: Payer: Self-pay | Admitting: Family

## 2016-06-25 DIAGNOSIS — R1312 Dysphagia, oropharyngeal phase: Secondary | ICD-10-CM | POA: Diagnosis not present

## 2016-06-25 DIAGNOSIS — E782 Mixed hyperlipidemia: Secondary | ICD-10-CM | POA: Diagnosis not present

## 2016-06-25 DIAGNOSIS — K5901 Slow transit constipation: Secondary | ICD-10-CM

## 2016-06-25 NOTE — Progress Notes (Signed)
Location:  [place] Room Number: 301B Place of Service:  SNF (31) Provider:  Marlowe Sax, NP  Blanchie Serve, MD  Patient Care Team: Blanchie Serve, MD as PCP - General (Internal Medicine)  Extended Emergency Contact Information Primary Emergency Contact: Creed,Brianna Address: [address]          [place], [place] [address] Johnnette Litter of [place] Phone: [phone] Relation: [place] Secondary Emergency Contact: [place] of [place] Mobile Phone: [phone] Relation: Grandaughter  Code Status:  DNR Goals of care: Advanced Directive information Advanced Directives 06/25/2016  Does Patient Have a Medical Advance Directive? Yes  Type of Paramedic of [place];Out of facility DNR (pink MOST or yellow form)  Does patient want to make changes to medical advance directive? -  Copy of [place] in Chart? Yes  Pre-existing out of facility DNR order (yellow form or pink MOST form) Yellow form placed in chart (order not valid for inpatient use)     Chief Complaint  Patient presents with  . Medical Management of Chronic Issues    routine visit    HPI:  Pt is a 83 y.o. female seen today for medical management of chronic diseases.She has a medical history of  HTN, Afib, CVA with right side hemiplegia, Dysphagia, hyperlipidemia, Thrombocytopenia, depression,among other conditions. She is seen in her room today.She denies any acute issues this visit.She is able to feed self but requires assistance with other ADL's. She spends most time on the wheelchair during the day. She has no skin breakdown. No recent acute illness or hospital visit since prior visit.   Past Medical History:  Diagnosis Date  . Asthma   . Cancer ([place])   . Dysphagia   . Hematuria   . Hypertension   . Hypokalemia   . PAF (paroxysmal atrial fibrillation) ([place])   . Stroke [facility])    Past Surgical  History:  Procedure Laterality Date  . ABDOMINAL HYSTERECTOMY     PARTIAL  . APPENDECTOMY    . BREAST SURGERY    . CERVICAL SPINE SURGERY    . ECTOPIC PREGNANCY SURGERY    . MYOMECTOMY    . NECK SURGERY     c-spine  . RADIOLOGY WITH ANESTHESIA N/A 07/19/2015   Procedure: RADIOLOGY WITH ANESTHESIA;  Surgeon: Luanne Bras, MD;  Location: [facility];  Service: Radiology;  Laterality: N/A;    Allergies  Allergen Reactions  . Penicillins Rash    No other information available at this time  . Aspirin Other (See Comments)    Burning in stomach  . Morphine Other (See Comments)    delusions    Allergies as of 06/25/2016      Reactions   Penicillins Rash   No other information available at this time   Aspirin Other (See Comments)   Burning in stomach   Morphine Other (See Comments)   delusions      Medication List       Accurate as of 06/25/16  8:34 AM. Always use your most recent med list.          acetaminophen 500 MG tablet Commonly known as:  TYLENOL Take 500 mg by mouth 2 (two) times daily.   acetaminophen 325 MG tablet Commonly known as:  TYLENOL Take 650 mg by mouth every 4 (four) hours as needed for mild pain.   apixaban 5 MG Tabs tablet Commonly known as:  ELIQUIS Take 1 tablet (  5 mg total) by mouth 2 (two) times daily.   atorvastatin 10 MG tablet Commonly known as:  LIPITOR Take 1 tablet (10 mg total) by mouth daily at 6 PM.   baclofen 10 MG tablet Commonly known as:  LIORESAL Take 10 mg by mouth 3 (three) times daily.   buPROPion 100 MG 12 hr tablet Commonly known as:  WELLBUTRIN SR Take 1 tablet (100 mg total) by mouth daily.   diltiazem 60 MG tablet Commonly known as:  CARDIZEM Take 1 tablet (60 mg total) by mouth every 8 (eight) hours.   docusate 50 MG/5ML liquid Commonly known as:  COLACE Take by mouth daily. Give 10 mL   hydrALAZINE 25 MG tablet Commonly known as:  APRESOLINE Take one tablet twice daily. Hold for SBP<110   lisinopril 20  MG tablet Commonly known as:  PRINIVIL,ZESTRIL Take 20 mg by mouth daily.   multivitamin tablet Take 1 tablet by mouth daily.   polyethylene glycol packet Commonly known as:  MIRALAX / GLYCOLAX Take 17 g by mouth. Take every other day for constipation   potassium chloride 20 MEQ/15ML (10%) Soln Take 15 mLs (20 mEq total) by mouth 2 (two) times daily.   UNABLE TO FIND Med Name: Med pass 120 mL 2 times daily between meals       Review of Systems  Unable to perform ROS: Other    Immunization History  Administered Date(s) Administered  . PPD Test 07/28/2015, 08/11/2015   Pertinent  Health Maintenance Due  Topic Date Due  . INFLUENZA VACCINE  11/25/2015  . DEXA SCAN  04/26/2017 (Originally 04/25/1992)  . PNA vac Low Risk Adult (1 of 2 - PCV13) 04/26/2017 (Originally 04/25/1992)   Fall Risk  09/30/2015  Falls in the past year? No    Vitals:   06/25/16 0825  BP: 133/74  Pulse: 62  Weight: 131 lb (59.4 kg)  Height: 5\' 2"  (1.575 m)   Body mass index is 23.96 kg/m. Physical Exam  Constitutional: She appears well-developed and well-nourished.  Pleasant elderly in no acute distress  HENT:  Head: Normocephalic.  Mouth/Throat: Oropharynx is clear and moist. No oropharyngeal exudate.  Aphasia but able to make needs known.   Eyes: Conjunctivae and EOM are normal. Pupils are equal, round, and reactive to light. Right eye exhibits no discharge. Left eye exhibits no discharge. No scleral icterus.  Neck: Normal range of motion. Neck supple. No JVD present. No thyromegaly present.  Cardiovascular: Normal rate, regular rhythm, normal heart sounds and intact distal pulses.  Exam reveals no gallop and no friction rub.   No murmur heard. Pulmonary/Chest: Effort normal and breath sounds normal. No respiratory distress. She has no wheezes. She has no rales.  Abdominal: Soft. Bowel sounds are normal. She exhibits no distension. There is no tenderness. There is no rebound and no guarding.   Genitourinary:  Genitourinary Comments: Foley Catheter draining yellow clear urine.   Musculoskeletal: She exhibits no edema, tenderness or deformity.  Right side hemiparalysis.  Lymphadenopathy:    She has no cervical adenopathy.  Neurological: She is alert.  Aphasia.Right side paralysis   Skin: Skin is warm and dry. No rash noted. No erythema. No pallor.  Psychiatric: She has a normal mood and affect.    Labs reviewed:  Recent Labs  07/21/15 0332  07/26/15 0407 07/27/15 0437  07/28/15 0237  11/26/15 12/09/15 02/02/16 02/27/16  NA 144  < > 142 142  < > 143  < > 146 141 142 146  K 4.0  < > 2.5* 3.3*  --  3.9  < > 3.7 3.9  --  3.6  CL 113*  < > 106 109  --  111  --   --   --   --   --   CO2 20*  < > 28 24  --  24  --   --   --   --   --   GLUCOSE 121*  < > 112* 132*  --  144*  --   --   --   --   --   BUN 21*  < > 10 13  < > 16  < > 36* 11 14 20   CREATININE 0.74  < > 0.57 0.62  < > 0.62  < > 0.8 0.5 0.6 0.5  CALCIUM 8.7*  < > 8.2* 8.2*  --  8.6*  --   --   --   --   --   MG 2.0  --   --  1.7  --   --   --   --   --   --   --   PHOS 3.3  --   --   --   --   --   --   --   --   --   --   < > = values in this interval not displayed.  Recent Labs  07/19/15 2121 07/21/15 0332 07/31/15 10/10/15 12/09/15  AST 25  --  25 12* 11*  ALT 26  --  15 12 9   ALKPHOS 107  --  90 109 110  BILITOT 0.7  --   --   --   --   PROT 7.2  --   --   --   --   ALBUMIN 4.2 3.2*  --   --   --     Recent Labs  07/19/15 2121  07/20/15 0450 07/21/15 0332  07/26/15 0407 07/27/15 0437  07/28/15 0237  11/26/15 12/09/15 02/02/16  WBC 5.9  --  6.8 9.3  < > 6.0 7.0  < > 7.7  < > 11.8 6.3 6.7  NEUTROABS 2.7  --  5.4 6.4  --   --   --   --   --   --   --   --   --   HGB 14.4  < > 12.6 13.1  < > 14.1 13.4  --  14.0  < > 11.8* 10.6* 11.3*  HCT 44.0  < > 39.3 40.4  < > 41.4 41.9  --  42.1  < > 36 33* 37  MCV 94.8  --  93.6 93.7  < > 92.8 93.5  --  94.6  --   --   --   --   PLT 138*  --  117* 122*  < >  117* 133*  --  145*  < > 321 274 351  < > = values in this interval not displayed. Lab Results  Component Value Date   TSH 1.37 03/31/2016   Lab Results  Component Value Date   HGBA1C 7.5 03/31/2016   Lab Results  Component Value Date   CHOL 116 03/31/2016   HDL 48 03/31/2016   LDLCALC 56 03/31/2016   TRIG 62 03/31/2016   CHOLHDL 2.7 07/20/2015   Assessment/Plan Dysphagia Stable.Observed eating breakfast no cough noted. Continue to follow up with ST at the facility. Continue with aspiration precautions.    Hyperlipidemia  Continue on  atorvastatin. Monitor lipid panel.   Constipation  Current regimen effective. Continue to encourage oral and fluid intake.   Family/ staff Communication: Reviewed plan of care with patient and facility Nurse supervisor.   Labs/tests ordered: None

## 2016-08-02 ENCOUNTER — Encounter: Payer: Self-pay | Admitting: Internal Medicine

## 2016-08-02 ENCOUNTER — Non-Acute Institutional Stay (SKILLED_NURSING_FACILITY): Payer: Medicare Other | Admitting: Internal Medicine

## 2016-08-02 DIAGNOSIS — F329 Major depressive disorder, single episode, unspecified: Secondary | ICD-10-CM

## 2016-08-02 DIAGNOSIS — I482 Chronic atrial fibrillation, unspecified: Secondary | ICD-10-CM | POA: Insufficient documentation

## 2016-08-02 DIAGNOSIS — G8111 Spastic hemiplegia affecting right dominant side: Secondary | ICD-10-CM

## 2016-08-02 DIAGNOSIS — F0150 Vascular dementia without behavioral disturbance: Secondary | ICD-10-CM | POA: Diagnosis not present

## 2016-08-02 DIAGNOSIS — N312 Flaccid neuropathic bladder, not elsewhere classified: Secondary | ICD-10-CM

## 2016-08-02 DIAGNOSIS — I10 Essential (primary) hypertension: Secondary | ICD-10-CM

## 2016-08-02 DIAGNOSIS — E785 Hyperlipidemia, unspecified: Secondary | ICD-10-CM

## 2016-08-02 DIAGNOSIS — E876 Hypokalemia: Secondary | ICD-10-CM

## 2016-08-02 DIAGNOSIS — I679 Cerebrovascular disease, unspecified: Secondary | ICD-10-CM

## 2016-08-02 NOTE — Progress Notes (Signed)
LOCATION: [facility]  PCP: Blanchie Serve, MD   Code Status: DNR  Goals of care: Advanced Directive information Advanced Directives 06/25/2016  Does Patient Have a Medical Advance Directive? Yes  Type of Paramedic of [place];Out of facility DNR (pink MOST or yellow form)  Does patient want to make changes to medical advance directive? -  Copy of [place] in Chart? Yes  Pre-existing out of facility DNR order (yellow form or pink MOST form) Yellow form placed in chart (order not valid for inpatient use)       Extended Emergency Contact Information Primary Emergency Contact: Dickie,Brianna Address: [address]          [place], [name] [address] Johnnette Litter of [place] Phone: [phone] Relation: [place] Secondary Emergency Contact: Charlyne Mom [place] of [place] Mobile Phone: [phone] Relation: Grandaughter   Allergies  Allergen Reactions  . Penicillins Rash    No other information available at this time  . Aspirin Other (See Comments)    Burning in stomach  . Morphine Other (See Comments)    delusions    Chief Complaint  Patient presents with  . Medical Management of Chronic Issues    Routine Visit      HPI:  Patient is a 83 y.o. female seen today for routine visit. She is out of bed daily for now. She is under total care. She participates minimally in HPI and ROS. No fall reported by nursing. No pressure ulcer or wound reported.   Review of Systems: Unable to obtain     Past Medical History:  Diagnosis Date  . Asthma   . Cancer ([place])   . Dysphagia   . Hematuria   . Hypertension   . Hypokalemia   . PAF (paroxysmal atrial fibrillation) ([place])   . Stroke [facility])    Past Surgical History:  Procedure Laterality Date  . ABDOMINAL HYSTERECTOMY     PARTIAL  . APPENDECTOMY    . BREAST SURGERY    . CERVICAL SPINE SURGERY    . ECTOPIC PREGNANCY SURGERY    . MYOMECTOMY    .  NECK SURGERY     c-spine  . RADIOLOGY WITH ANESTHESIA N/A 07/19/2015   Procedure: RADIOLOGY WITH ANESTHESIA;  Surgeon: Luanne Bras, MD;  Location: [facility];  Service: Radiology;  Laterality: N/A;   Social History:   reports that she has never smoked. She has never used smokeless tobacco. She reports that she does not drink alcohol or use drugs.  Family History  Problem Relation Age of Onset  . Hypertension Mother   . Stroke Mother   . Cancer Sister     BREAST  . Stroke Sister     Medications: Allergies as of 08/02/2016      Reactions   Penicillins Rash   No other information available at this time   Aspirin Other (See Comments)   Burning in stomach   Morphine Other (See Comments)   delusions      Medication List       Accurate as of 08/02/16  2:35 PM. Always use your most recent med list.          acetaminophen 500 MG tablet Commonly known as:  TYLENOL Take 500 mg by mouth 2 (two) times daily.   acetaminophen 325 MG tablet Commonly known as:  TYLENOL Take 650 mg by mouth every 4 (four) hours as needed for mild pain.   apixaban 5 MG Tabs  tablet Commonly known as:  ELIQUIS Take 1 tablet (5 mg total) by mouth 2 (two) times daily.   atorvastatin 10 MG tablet Commonly known as:  LIPITOR Take 5 mg by mouth daily.   baclofen 10 MG tablet Commonly known as:  LIORESAL Take 10 mg by mouth 3 (three) times daily.   buPROPion 100 MG 12 hr tablet Commonly known as:  WELLBUTRIN SR Take 1 tablet (100 mg total) by mouth daily.   diltiazem 60 MG tablet Commonly known as:  CARDIZEM Take 1 tablet (60 mg total) by mouth every 8 (eight) hours.   docusate 50 MG/5ML liquid Commonly known as:  COLACE Take by mouth daily. Give 10 mL   hydrALAZINE 25 MG tablet Commonly known as:  APRESOLINE Take 25 mg by mouth 3 (three) times daily. Hold for SBP<110   lisinopril 20 MG tablet Commonly known as:  PRINIVIL,ZESTRIL Take 20 mg by mouth daily.   multivitamin tablet Take 1  tablet by mouth daily.   polyethylene glycol packet Commonly known as:  MIRALAX / GLYCOLAX Take 17 g by mouth. Take every other day for constipation   potassium chloride 20 MEQ/15ML (10%) Soln Take 15 mLs (20 mEq total) by mouth 2 (two) times daily.   UNABLE TO FIND Med Name: Med pass 120 mL by mouth daily       Immunizations: Immunization History  Administered Date(s) Administered  . PPD Test 07/28/2015, 08/11/2015     Physical Exam: Vitals:   08/02/16 1428  BP: 133/64  Pulse: 76  Resp: 18  Temp: 97.3 F (36.3 C)  TempSrc: Oral  SpO2: 95%  Weight: 133 lb 12.8 oz (60.7 kg)  Height: 5\' 2"  (1.575 m)   Wt Readings from Last 3 Encounters:  08/02/16 133 lb 12.8 oz (60.7 kg)  06/25/16 131 lb (59.4 kg)  05/31/16 133 lb (60.3 kg)    Body mass index is 24.47 kg/m.  General- elderly female, frail, in no acute distress Head- normocephalic, atraumatic Nose- no maxillary or frontal sinus tenderness, no nasal discharge Throat- moist mucus membrane, missing dentition Eyes- PERRLA, EOMI, no pallor, no icterus Neck- no cervical lymphadenopathy Cardiovascular- irregular heart rate, no murmur Respiratory- bilateral clear to auscultation, no wheeze, no rhonchi, no crackles, no use of accessory muscles Abdomen- bowel sounds present, soft, non tender, foley catheter in place Musculoskeletal- right sided hemiplegia, dysarthria present, expressive aphasia, trace leg edema, arthritis changes to fingers, right hemiplegia and right wrist contracture noted Neurological- alert and oriented to self only Skin- warm and dry   Labs reviewed: Basic Metabolic Panel:  Recent Labs  11/26/15 12/09/15 02/02/16 02/27/16  NA 146 141 142 146  K 3.7 3.9  --  3.6  BUN 36* 11 14 20   CREATININE 0.8 0.5 0.6 0.5   Liver Function Tests:  Recent Labs  10/10/15 12/09/15  AST 12* 11*  ALT 12 9  ALKPHOS 109 110   No results for input(s): LIPASE, AMYLASE in the last 8760 hours. No results for  input(s): AMMONIA in the last 8760 hours. CBC:  Recent Labs  11/26/15 12/09/15 02/02/16  WBC 11.8 6.3 6.7  HGB 11.8* 10.6* 11.3*  HCT 36 33* 37  PLT 321 274 351    Radiological Exams: No results found. 2D echo  - Left ventricle: The cavity size was normal. There was mild concentric hypertrophy. Systolic function was vigorous. The estimated ejection fraction was in the range of 65% to 70%.   - Aortic valve: Valve area (VTI): 1.51 cm^2.  Valve area (Vmax): 1.74 cm^2. Valve area (Vmean): 1.74 cm^2. - Mitral valve: There was mild regurgitation. - Left atrium: The atrium was mildly dilated. - Right atrium: The atrium was mildly dilated. - Atrial septum: No defect or patent foramen ovale was identified.   Modified Barium Swallow with speech therapy 07/23/2015 Delayed swallow initiation noted Mild aspiration risk Dysphagia 2 diet with thin liquids recommended    Assessment/Plan  Right hemiplegia with spasticity Post cva with contracture to hand. Continue with her splint, baclofen 10 mg tid and tylenol 500 mg bid. Continue eliquis for stroke prophylaxis  Hypokalemia On kcl supplement, check bmp  afib Controlled ventricular rate. Continue diltiazem 60 mg tid and eliquis 5 mg bid  Major depression Mood appears stable. Attempt GDR of wellbutrin to 75 mg daily and monitor. Psych to follow  Vascular dementia Provide supportive care. On eliquis and statin.  Hyperlipidemia Lipid Panel     Component Value Date/Time   CHOL 116 03/31/2016   TRIG 62 03/31/2016   HDL 48 03/31/2016   CHOLHDL 2.7 07/20/2015 0350   VLDL 7 07/20/2015 0350   LDLCALC 56 03/31/2016   LDL at goal. c/w atorvastatin 10 mg daily  Hypertension On review, few elevated BP reading. Currently on lisinopril to 20 mg daily, hydralazine 25 mg tid. Monitor BP 3 days a week for one month and reassess for med adjustment  Neurogenic bladder With foley catheter in place, s/p CVA. Continue foley  care     Blanchie Serve, MD Internal Medicine [facility] [place], [place] [address] Cell Phone (Monday-Friday 8 am - 5 pm): [phone] On Call: [phone] and follow prompts after 5 pm and on weekends Office Phone: [phone] Office Fax: [phone]

## 2016-08-04 LAB — LIPID PANEL
Cholesterol: 134 mg/dL (ref 0–200)
HDL: 49 mg/dL (ref 35–70)
LDL Cholesterol: 73 mg/dL
Triglycerides: 56 mg/dL (ref 40–160)

## 2016-08-04 LAB — BASIC METABOLIC PANEL
BUN: 22 mg/dL — AB (ref 4–21)
CREATININE: 0.6 mg/dL (ref 0.5–1.1)
GLUCOSE: 155 mg/dL
POTASSIUM: 3.6 mmol/L (ref 3.4–5.3)
SODIUM: 144 mmol/L (ref 137–147)

## 2016-08-26 ENCOUNTER — Encounter: Payer: Self-pay | Admitting: Family

## 2016-08-26 ENCOUNTER — Non-Acute Institutional Stay (SKILLED_NURSING_FACILITY): Payer: Medicare Other | Admitting: Family

## 2016-08-26 DIAGNOSIS — F0150 Vascular dementia without behavioral disturbance: Secondary | ICD-10-CM

## 2016-08-26 DIAGNOSIS — F341 Dysthymic disorder: Secondary | ICD-10-CM | POA: Diagnosis not present

## 2016-08-26 DIAGNOSIS — I10 Essential (primary) hypertension: Secondary | ICD-10-CM | POA: Diagnosis not present

## 2016-08-26 DIAGNOSIS — Z978 Presence of other specified devices: Secondary | ICD-10-CM

## 2016-08-26 DIAGNOSIS — Z960 Presence of urogenital implants: Secondary | ICD-10-CM | POA: Diagnosis not present

## 2016-08-26 DIAGNOSIS — F329 Major depressive disorder, single episode, unspecified: Secondary | ICD-10-CM

## 2016-08-26 NOTE — Progress Notes (Addendum)
Location:    Nursing Home Room Number: 301B Place of Service:  SNF (31) Provider: Marlowe Sax, FNP-C Blanchie Serve, MD   Blanchie Serve, MD  Patient Care Team: Blanchie Serve, MD as PCP - General (Internal Medicine)  Extended Emergency Contact Information Primary Emergency Contact: Oki,Brianna Address: [address]          [place], [place] [address] Johnnette Litter of [place] Phone: [phone] Relation: [place] Secondary Emergency Contact: Charlyne Mom [place] of [place] Mobile Phone: [phone] Relation: Grandaughter  Code Status: DNR Goals of care: Advanced Directive information Advanced Directives 08/26/2016  Does Patient Have a Medical Advance Directive? Yes  Type of Advance Directive Out of facility DNR (pink MOST or yellow form)  Does patient want to make changes to medical advance directive? -  Copy of [place] in Chart? -  Pre-existing out of facility DNR order (yellow form or pink MOST form) Yellow form placed in chart (order not valid for inpatient use)     Chief Complaint  Patient presents with  . Medical Management of Chronic Issues    Routine Visit    HPI:  Pt is a 83 y.o. female seen today at [facility] for medical management of chronic diseases. She has a medical history of HTN, CVA, Afib, Hyperlipidemia among others. She is seen in her room today. She has had no recent acute illness. Her weight remains stable. No fall episode or skin break down since prir visit. She has aphasia answers yes to all questions.Facility reports no new concerns.    Past Medical History:  Diagnosis Date  . Asthma   . Cancer ([place])   . Dysphagia   . Hematuria   . Hypertension   . Hypokalemia   . PAF (paroxysmal atrial fibrillation) ([place])   . Stroke [facility])    Past Surgical History:  Procedure Laterality Date  . ABDOMINAL HYSTERECTOMY     PARTIAL  . APPENDECTOMY    . BREAST SURGERY    . CERVICAL  SPINE SURGERY    . ECTOPIC PREGNANCY SURGERY    . MYOMECTOMY    . NECK SURGERY     c-spine  . RADIOLOGY WITH ANESTHESIA N/A 07/19/2015   Procedure: RADIOLOGY WITH ANESTHESIA;  Surgeon: Luanne Bras, MD;  Location: [facility];  Service: Radiology;  Laterality: N/A;    Allergies  Allergen Reactions  . Penicillins Rash    No other information available at this time  . Aspirin Other (See Comments)    Burning in stomach  . Morphine Other (See Comments)    delusions    Allergies as of 08/26/2016      Reactions   Penicillins Rash   No other information available at this time   Aspirin Other (See Comments)   Burning in stomach   Morphine Other (See Comments)   delusions      Medication List       Accurate as of 08/26/16  2:34 PM. Always use your most recent med list.          acetaminophen 500 MG tablet Commonly known as:  TYLENOL Take 500 mg by mouth 2 (two) times daily.   acetaminophen 325 MG tablet Commonly known as:  TYLENOL Take 650 mg by mouth every 4 (four) hours as needed for mild pain.   apixaban 5 MG Tabs tablet Commonly known as:  ELIQUIS Take 1 tablet (5 mg total) by mouth 2 (two) times daily.   atorvastatin 10  MG tablet Commonly known as:  LIPITOR Take 5 mg by mouth daily.   baclofen 10 MG tablet Commonly known as:  LIORESAL Take 10 mg by mouth 3 (three) times daily.   buPROPion 75 MG tablet Commonly known as:  WELLBUTRIN Take 75 mg by mouth daily.   diltiazem 60 MG tablet Commonly known as:  CARDIZEM Take 1 tablet (60 mg total) by mouth every 8 (eight) hours.   docusate 50 MG/5ML liquid Commonly known as:  COLACE Take by mouth daily. Give 10 mL   feeding supplement (PRO-STAT SUGAR FREE 64) Liqd Take 30 mLs by mouth as directed. 30 minutes prior to meals for 30 days beginning 08/12/16. Document % consumed   hydrALAZINE 25 MG tablet Commonly known as:  APRESOLINE Take 25 mg by mouth 3 (three) times daily. Hold for SBP<110   lisinopril 20 MG  tablet Commonly known as:  PRINIVIL,ZESTRIL Take 20 mg by mouth daily.   multivitamin tablet Take 1 tablet by mouth daily.   polyethylene glycol packet Commonly known as:  MIRALAX / GLYCOLAX Take 17 g by mouth. Take every other day for constipation   potassium chloride 20 MEQ/15ML (10%) Soln Take 15 mLs (20 mEq total) by mouth 2 (two) times daily.   UNABLE TO FIND Med Name: Med pass 120 mL by mouth daily       Review of Systems  Unable to perform ROS: Other    Immunization History  Administered Date(s) Administered  . PPD Test 07/28/2015, 08/11/2015   Pertinent  Health Maintenance Due  Topic Date Due  . DEXA SCAN  04/26/2017 (Originally 04/25/1992)  . PNA vac Low Risk Adult (1 of 2 - PCV13) 04/26/2017 (Originally 04/25/1992)  . INFLUENZA VACCINE  11/24/2016   Fall Risk  09/30/2015  Falls in the past year? No    Vitals:   08/26/16 1105  BP: 135/74  Pulse: 81  Resp: 20  Temp: 98 F (36.7 C)  SpO2: 98%  Weight: 144 lb 3.2 oz (65.4 kg)  Height: 5\' 2"  (1.575 m)   Body mass index is 26.37 kg/m. Physical Exam  Constitutional: She appears well-developed and well-nourished. No distress.  HENT:  Head: Normocephalic.  Mouth/Throat: Oropharynx is clear and moist.  Eyes: Conjunctivae and EOM are normal. Pupils are equal, round, and reactive to light. Right eye exhibits no discharge. Left eye exhibits no discharge. No scleral icterus.  Neck: Normal range of motion.  Cardiovascular: Intact distal pulses.  Exam reveals no gallop and no friction rub.   No murmur heard. Chronic Irregular heart rate   Pulmonary/Chest: Effort normal and breath sounds normal. No respiratory distress. She has no wheezes. She has no rales.  Abdominal: Soft. Bowel sounds are normal. She exhibits no distension. There is no tenderness. There is no rebound and no guarding.  Genitourinary:  Genitourinary Comments: Foley Catheter draining adequate amounts of cloudy urine with sediments.     Musculoskeletal: She exhibits no edema.  Right Hemiparesis. Right hand pain with movement.   Lymphadenopathy:    She has no cervical adenopathy.  Neurological: She is alert.  Skin: Skin is warm and dry. No rash noted. No erythema. No pallor.  Psychiatric: She has a normal mood and affect.    Labs reviewed:  Recent Labs  12/09/15 02/02/16 02/27/16 08/04/16  NA 141 142 146 144  K 3.9  --  3.6 3.6  BUN 11 14 20  22*  CREATININE 0.5 0.6 0.5 0.6    Recent Labs  10/10/15 12/09/15  AST 12* 11*  ALT 12 9  ALKPHOS 109 110    Recent Labs  11/26/15 12/09/15 02/02/16  WBC 11.8 6.3 6.7  HGB 11.8* 10.6* 11.3*  HCT 36 33* 37  PLT 321 274 351   Lab Results  Component Value Date   TSH 1.37 03/31/2016   Lab Results  Component Value Date   HGBA1C 7.5 03/31/2016   Lab Results  Component Value Date   CHOL 134 08/04/2016   HDL 49 08/04/2016   LDLCALC 73 08/04/2016   TRIG 56 08/04/2016   CHOLHDL 2.7 07/20/2015    Assessment/Plan HTN B/p stable. Continue on Amlodipine , Hydralazine,Diltiazem and Lisinopril. Monitor BMP  Major Depression Stable.Tolerating GDR Wellbutrin well.continue to follow up with Psychiatry service. Monitor for mood changes.    Dementia  No new behavioral issues reported. Continue to assist with ADL'S.Skin care.    Indwelling Foley Cath Foley Catheter draining cloudy colored urine with sediments. Obtain urine specimen for U/a and C/s rule out UTI.  Family/ staff Communication: Reviewed plan of care with facility Nurse supervisor.    Labs/tests ordered: urine specimen for U/A and C/S.

## 2016-09-13 ENCOUNTER — Non-Acute Institutional Stay (SKILLED_NURSING_FACILITY): Payer: Medicare Other

## 2016-09-13 DIAGNOSIS — Z0000 Encounter for general adult medical examination without abnormal findings: Secondary | ICD-10-CM

## 2016-09-13 NOTE — Patient Instructions (Signed)
Joy Holden , Thank you for taking time to come for your Medicare Wellness Visit. I appreciate your ongoing commitment to your health goals. Please review the following plan we discussed and let me know if I can assist you in the future.   Screening recommendations/referrals: Colonoscopy up to date, over age 81 Mammogram up to date, over age 10 Bone Density up to date Recommended yearly ophthalmology/optometry visit for glaucoma screening and checkup Recommended yearly dental visit for hygiene and checkup  Vaccinations: Influenza vaccine due, declined Pneumococcal vaccine due, declined Tdap vaccine not in records Shingles vaccine not in records  Advanced directives: In Chart  Conditions/risks identified: none  Next appointment: none upcoming   Preventive Care 81 Years and Older, Female Preventive care refers to lifestyle choices and visits with your health care provider that can promote health and wellness. What does preventive care include?  A yearly physical exam. This is also called an annual well check.  Dental exams once or twice a year.  Routine eye exams. Ask your health care provider how often you should have your eyes checked.  Personal lifestyle choices, including:  Daily care of your teeth and gums.  Regular physical activity.  Eating a healthy diet.  Avoiding tobacco and drug use.  Limiting alcohol use.  Practicing safe sex.  Taking low-dose aspirin every day.  Taking vitamin and mineral supplements as recommended by your health care provider. What happens during an annual well check? The services and screenings done by your health care provider during your annual well check will depend on your age, overall health, lifestyle risk factors, and family history of disease. Counseling  Your health care provider may ask you questions about your:  Alcohol use.  Tobacco use.  Drug use.  Emotional well-being.  Home and relationship  well-being.  Sexual activity.  Eating habits.  History of falls.  Memory and ability to understand (cognition).  Work and work Statistician.  Reproductive health. Screening  You may have the following tests or measurements:  Height, weight, and BMI.  Blood pressure.  Lipid and cholesterol levels. These may be checked every 5 years, or more frequently if you are over 55 years old.  Skin check.  Lung cancer screening. You may have this screening every year starting at age 34 if you have a 30-pack-year history of smoking and currently smoke or have quit within the past 15 years.  Fecal occult blood test (FOBT) of the stool. You may have this test every year starting at age 57.  Flexible sigmoidoscopy or colonoscopy. You may have a sigmoidoscopy every 5 years or a colonoscopy every 10 years starting at age 81.  Hepatitis C blood test.  Hepatitis B blood test.  Sexually transmitted disease (STD) testing.  Diabetes screening. This is done by checking your blood sugar (glucose) after you have not eaten for a while (fasting). You may have this done every 1-3 years.  Bone density scan. This is done to screen for osteoporosis. You may have this done starting at age 73.  Mammogram. This may be done every 1-2 years. Talk to your health care provider about how often you should have regular mammograms. Talk with your health care provider about your test results, treatment options, and if necessary, the need for more tests. Vaccines  Your health care provider may recommend certain vaccines, such as:  Influenza vaccine. This is recommended every year.  Tetanus, diphtheria, and acellular pertussis (Tdap, Td) vaccine. You may need a Td booster every 10  years.  Zoster vaccine. You may need this after age 81.  Pneumococcal 13-valent conjugate (PCV13) vaccine. One dose is recommended after age 61.  Pneumococcal polysaccharide (PPSV23) vaccine. One dose is recommended after age  72. Talk to your health care provider about which screenings and vaccines you need and how often you need them. This information is not intended to replace advice given to you by your health care provider. Make sure you discuss any questions you have with your health care provider. Document Released: 05/09/2015 Document Revised: 12/31/2015 Document Reviewed: 02/11/2015 Elsevier Interactive Patient Education  2017 [place] Prevention in the Home Falls can cause injuries. They can happen to people of all ages. There are many things you can do to make your home safe and to help prevent falls. What can I do on the outside of my home?  Regularly fix the edges of walkways and driveways and fix any cracks.  Remove anything that might make you trip as you walk through a door, such as a raised step or threshold.  Trim any bushes or trees on the path to your home.  Use bright outdoor lighting.  Clear any walking paths of anything that might make someone trip, such as rocks or tools.  Regularly check to see if handrails are loose or broken. Make sure that both sides of any steps have handrails.  Any raised decks and porches should have guardrails on the edges.  Have any leaves, snow, or ice cleared regularly.  Use sand or salt on walking paths during winter.  Clean up any spills in your garage right away. This includes oil or grease spills. What can I do in the bathroom?  Use night lights.  Install grab bars by the toilet and in the tub and shower. Do not use towel bars as grab bars.  Use non-skid mats or decals in the tub or shower.  If you need to sit down in the shower, use a plastic, non-slip stool.  Keep the floor dry. Clean up any water that spills on the floor as soon as it happens.  Remove soap buildup in the tub or shower regularly.  Attach bath mats securely with double-sided non-slip rug tape.  Do not have throw rugs and other things on the floor that can make  you trip. What can I do in the bedroom?  Use night lights.  Make sure that you have a light by your bed that is easy to reach.  Do not use any sheets or blankets that are too big for your bed. They should not hang down onto the floor.  Have a firm chair that has side arms. You can use this for support while you get dressed.  Do not have throw rugs and other things on the floor that can make you trip. What can I do in the kitchen?  Clean up any spills right away.  Avoid walking on wet floors.  Keep items that you use a lot in easy-to-reach places.  If you need to reach something above you, use a strong step stool that has a grab bar.  Keep electrical cords out of the way.  Do not use floor polish or wax that makes floors slippery. If you must use wax, use non-skid floor wax.  Do not have throw rugs and other things on the floor that can make you trip. What can I do with my stairs?  Do not leave any items on the stairs.  Make sure that  there are handrails on both sides of the stairs and use them. Fix handrails that are broken or loose. Make sure that handrails are as long as the stairways.  Check any carpeting to make sure that it is firmly attached to the stairs. Fix any carpet that is loose or worn.  Avoid having throw rugs at the top or bottom of the stairs. If you do have throw rugs, attach them to the floor with carpet tape.  Make sure that you have a light switch at the top of the stairs and the bottom of the stairs. If you do not have them, ask someone to add them for you. What else can I do to help prevent falls?  Wear shoes that:  Do not have high heels.  Have rubber bottoms.  Are comfortable and fit you well.  Are closed at the toe. Do not wear sandals.  If you use a stepladder:  Make sure that it is fully opened. Do not climb a closed stepladder.  Make sure that both sides of the stepladder are locked into place.  Ask someone to hold it for you, if  possible.  Clearly mark and make sure that you can see:  Any grab bars or handrails.  First and last steps.  Where the edge of each step is.  Use tools that help you move around (mobility aids) if they are needed. These include:  Canes.  Walkers.  Scooters.  Crutches.  Turn on the lights when you go into a dark area. Replace any light bulbs as soon as they burn out.  Set up your furniture so you have a clear path. Avoid moving your furniture around.  If any of your floors are uneven, fix them.  If there are any pets around you, be aware of where they are.  Review your medicines with your doctor. Some medicines can make you feel dizzy. This can increase your chance of falling. Ask your doctor what other things that you can do to help prevent falls. This information is not intended to replace advice given to you by your health care provider. Make sure you discuss any questions you have with your health care provider. Document Released: 02/06/2009 Document Revised: 09/18/2015 Document Reviewed: 05/17/2014 Elsevier Interactive Patient Education  2017 [employer].

## 2016-09-13 NOTE — Progress Notes (Signed)
Quick Notes   Health Maintenance: PNA and flu declined.      Abnormal Screen: BP 150/90 but morning meds were not given yet.     Patient Concerns: None    Nurse Concerns: none upcoming

## 2016-09-13 NOTE — Progress Notes (Signed)
Subjective:   Joy Holden is a 83 y.o. female who presents for Medicare Annual preventive examination at [facility]     Objective:     Vitals: BP (!) 150/90 (BP Location: Left Arm, Patient Position: Supine)   Pulse 74   Temp 97.6 F (36.4 C) (Oral)   Ht 5\' 2"  (1.575 m)   Wt 144 lb (65.3 kg)   SpO2 96%   BMI 26.34 kg/m   Body mass index is 26.34 kg/m.   Tobacco History  Smoking Status  . Never Smoker  Smokeless Tobacco  . Never Used     Counseling given: Not Answered   Past Medical History:  Diagnosis Date  . Asthma   . Cancer ([place])   . Dysphagia   . Hematuria   . Hypertension   . Hypokalemia   . PAF (paroxysmal atrial fibrillation) ([place])   . Stroke [facility])    Past Surgical History:  Procedure Laterality Date  . ABDOMINAL HYSTERECTOMY     PARTIAL  . APPENDECTOMY    . BREAST SURGERY    . CERVICAL SPINE SURGERY    . ECTOPIC PREGNANCY SURGERY    . MYOMECTOMY    . NECK SURGERY     c-spine  . RADIOLOGY WITH ANESTHESIA N/A 07/19/2015   Procedure: RADIOLOGY WITH ANESTHESIA;  Surgeon: Luanne Bras, MD;  Location: [facility];  Service: Radiology;  Laterality: N/A;   Family History  Problem Relation Age of Onset  . Hypertension Mother   . Stroke Mother   . Cancer Sister        BREAST  . Stroke Sister    History  Sexual Activity  . Sexual activity: No    Outpatient Encounter Prescriptions as of 09/13/2016  Medication Sig  . acetaminophen (TYLENOL) 325 MG tablet Take 650 mg by mouth every 4 (four) hours as needed for mild pain.  Marland Kitchen acetaminophen (TYLENOL) 500 MG tablet Take 500 mg by mouth 2 (two) times daily.  . Amino Acids-Protein Hydrolys (FEEDING SUPPLEMENT, PRO-STAT SUGAR FREE 64,) LIQD Take 30 mLs by mouth as directed. 30 minutes prior to meals for 30 days beginning 08/12/16. Document % consumed  . apixaban (ELIQUIS) 5 MG TABS tablet Take 1 tablet (5 mg total) by mouth 2 (two) times daily.  Marland Kitchen atorvastatin (LIPITOR) 10 MG tablet Take 5  mg by mouth daily.  . baclofen (LIORESAL) 10 MG tablet Take 10 mg by mouth 3 (three) times daily.  Marland Kitchen buPROPion (WELLBUTRIN) 75 MG tablet Take 75 mg by mouth daily.  Marland Kitchen diltiazem (CARDIZEM) 60 MG tablet Take 1 tablet (60 mg total) by mouth every 8 (eight) hours.  . docusate (COLACE) 50 MG/5ML liquid Take by mouth daily. Give 10 mL  . hydrALAZINE (APRESOLINE) 25 MG tablet Take 25 mg by mouth 3 (three) times daily. Hold for SBP<110  . lisinopril (PRINIVIL,ZESTRIL) 20 MG tablet Take 20 mg by mouth daily.  . Multiple Vitamin (MULTIVITAMIN) tablet Take 1 tablet by mouth daily.  . polyethylene glycol (MIRALAX / GLYCOLAX) packet Take 17 g by mouth. Take every other day for constipation  . potassium chloride 20 MEQ/15ML (10%) SOLN Take 15 mLs (20 mEq total) by mouth 2 (two) times daily.  Marland Kitchen UNABLE TO FIND Med Name: Med pass 120 mL by mouth daily   No facility-administered encounter medications on file as of 09/13/2016.     Activities of Daily Living In your present state of health, do you have any difficulty performing the following  activities: 09/13/2016  Hearing? N  Vision? N  Difficulty concentrating or making decisions? N  Walking or climbing stairs? Y  Dressing or bathing? Y  Doing errands, shopping? Y  Preparing Food and eating ? Y  Using the Toilet? Y  In the past six months, have you accidently leaked urine? Y  Do you have problems with loss of bowel control? Y  Managing your Medications? Y  Managing your Finances? Y  Housekeeping or managing your Housekeeping? Y  Some recent data might be hidden    Patient Care Team: Blanchie Serve, MD as PCP - General (Internal Medicine)    Assessment:     Exercise Activities and Dietary recommendations Current Exercise Habits: The patient does not participate in regular exercise at present, Exercise limited by: orthopedic condition(s) (residual affects from past stroke)  Goals    . Maintain Lifestyle          Starting today pt will maintain  lifestyle.       Fall Risk Fall Risk  09/13/2016 09/30/2015  Falls in the past year? No No   Depression Screen PHQ 2/9 Scores 09/13/2016  PHQ - 2 Score 0     Cognitive Function MMSE - Mini Mental State Exam 09/13/2016  Not completed: Unable to complete        Immunization History  Administered Date(s) Administered  . PPD Test 07/28/2015, 08/11/2015   Screening Tests Health Maintenance  Topic Date Due  . DEXA SCAN  04/26/2017 (Originally 04/25/1992)  . TETANUS/TDAP  04/26/2017 (Originally 04/25/1944)  . PNA vac Low Risk Adult (1 of 2 - PCV13) 04/26/2017 (Originally 04/25/1992)  . INFLUENZA VACCINE  11/24/2016      Plan:     I have personally reviewed and noted the following in the patient's chart:   . Medical and social history . Use of alcohol, tobacco or illicit drugs  . Current medications and supplements . Functional ability and status . Nutritional status . Physical activity . Advanced directives . List of other physicians . Hospitalizations, surgeries, and ER visits in previous 12 months . Vitals . Screenings to include cognitive, depression, and falls . Referrals and appointments  In addition, I have reviewed and discussed with patient certain preventive protocols, quality metrics, and best practice recommendations. A written personalized care plan for preventive services as well as general preventive health recommendations were provided to patient.     Joy Kluver, RN  09/13/2016     I agree with above and was available to answer medical question/ concerns from [name].

## 2017-03-15 ENCOUNTER — Institutional Professional Consult (permissible substitution): Payer: Medicare Other | Admitting: Neurology

## 2017-03-29 ENCOUNTER — Ambulatory Visit (INDEPENDENT_AMBULATORY_CARE_PROVIDER_SITE_OTHER): Payer: Medicare Other | Admitting: Neurology

## 2017-03-29 ENCOUNTER — Encounter: Payer: Self-pay | Admitting: Neurology

## 2017-03-29 VITALS — BP 183/114 | HR 67

## 2017-03-29 DIAGNOSIS — G441 Vascular headache, not elsewhere classified: Secondary | ICD-10-CM | POA: Diagnosis not present

## 2017-03-29 MED ORDER — DIVALPROEX SODIUM ER 500 MG PO TB24: 500.0000 mg | ORAL_TABLET | Freq: Every day | ORAL | 5 refills | Status: DC

## 2017-03-29 NOTE — Progress Notes (Signed)
[facility] [address] [place]. [place] [address] [phone]       OFFICE FOLLOW-UP NOTE  Ms. NIASIA LANPHEAR Date of Birth:  1924/05/25 Medical Record Number:  [id number]   HPI: 63 year african american lady seen today for the first office follow-up visit for in-hospital admission for stroke in March 2017. She is accompanied by her daughter-in-law. History is obtained through review of hospital chart and speaking to her daughter-in-law YOCELINE BAZAR is an 83 y.o. female history of atrial fibrillation and hypertension brought to the emergency room following acute onset of speech abnormality, right facial droop and right hemiplegia. Patient was driving at the time of onset of her deficits. She was last known well at about 8:15 PM on 07/19/2015. She has no previous of stroke nor TIA. She has not been on anticoagulation or antiplatelet therapy. She reportedly is allergic to aspirin. CT scan of her head showed no acute intracranial abnormality. CT angiogram however showed occlusion of left ICA terminus with thrombus extending from the level of posterior indicating artery both M1 and A1 segments. Patient was treated with venous TPA with no change in deficits. Her NIH stroke score was 26. She was subsequently taken to interventional radiology suite for further management. Blood pressure was elevated, requiring intervention with hydralazine IV. LSN: 8:15 PM on 07/19/2015 tPA Given: Yes . She had emergent arteriogram,followed by complete revascularization of T occlusion of LT ICA ,LT MCA and Lt ACA with x 1pass with Solitaire 4 mm x 40 mm retrieval device and 5 mg of superselective IA TPA with TICI 2b revascularization by Dr. Estanislado Pandy. She was admitted to the intensive care unit initially intubated and blood pressure was tightly controlled. Follow-up imaging showed large left anterior cerebral artery and smaller left basal ganglia and frontal MCA branch infarcts on MRI scan with mild  cytotoxic edema and 3-4 mm left to right midline shift. She was treated conservatively and gradually extubated. She needed help with swallowing initially with a panda tube but subsequently was found to be able to swallow. She was mildly aphasic but and dense right hemiplegia. LDL cholesterol was 7 from percent. Transthoracic echo showed normal ejection fraction. She was found to be negative fibrillation. She was started on eliquis when she was able to swallow at the time of discharge. She was transferred to skilled nursing facility where she is currently staying. Is getting ongoing physical occupational speech therapy. She is able to communicate now with the moderate expressive aphasia with mild compression difficulties. She however has not regained any strength on the right side. She has full assist and spends most of the time in a wheelchair. She is unable to even stand up and walk even with physical therapist's assistance. She has indwelling Foley catheter. She is on eliquis and tolerating well without bleeding or bruising. Her daughter-in-law feels that she is depressed that she often crying and will not cooperate for the therapist. She complains of pain when the therapist tried to work on right side which is now become quite stiff. Update 03/29/2017 : She returns for follow-up after last visit with me a year and a half ago. She is accompanied by her guardian Rennis Petty. Patient is referred today it to see me because of concern about headaches the last 2 weeks or so. Patient continues to have significant expressive aphasia and it is unclear as to whether she is really having headaches are not. Patient is also significant depression and is quite tearful during this  visit. She has been on the Levaquin and the dose has recently been increased but it still is not helping. She remains significantly disabled with aerophagia, spastic hemiplegia and is mostly bedridden. She is full assist. She can feed herself. Patient  has not had any recurrent stroke or TIA symptoms. She has been on eliquis which is tolerating well. Blood pressure seems to be well controlled at today's quite elevated in office. Patient has not had any recent brain imaging studies done. There has been no witnessed seizure activity or any ongoing infection ROS:   14 system review of systems is positive for speech difficulty, trouble and standing, gait difficulty, weakness, pain , headache or depression and all other systems negative PMH:  Past Medical History:  Diagnosis Date  . Asthma   . Cancer ([place])   . Dysphagia   . Hematuria   . Hypertension   . Hypokalemia   . PAF (paroxysmal atrial fibrillation) ([place])   . Stroke [facility])     Social History:  Social History   Socioeconomic History  . Marital status: Widowed    Spouse name: Not on file  . Number of children: Not on file  . Years of education: Not on file  . Highest education level: Not on file  Social Needs  . Financial resource strain: Not on file  . Food insecurity - worry: Not on file  . Food insecurity - inability: Not on file  . Transportation needs - medical: Not on file  . Transportation needs - non-medical: Not on file  Occupational History  . Not on file  Tobacco Use  . Smoking status: Never Smoker  . Smokeless tobacco: Never Used  Substance and Sexual Activity  . Alcohol use: No  . Drug use: No  . Sexual activity: No  Other Topics Concern  . Not on file  Social History Narrative   Lives at [facility] since 07/28/15   Widowed   Never smoked   Alcohol none   DNR    Medications:   Current Outpatient Medications on File Prior to Visit  Medication Sig Dispense Refill  . acetaminophen (TYLENOL) 500 MG tablet Take 500 mg by mouth 2 (two) times daily.    . Amino Acids-Protein Hydrolys (FEEDING SUPPLEMENT, PRO-STAT SUGAR FREE 64,) LIQD Take 30 mLs by mouth as directed. 30 minutes prior to meals for 30 days beginning 08/12/16. Document % consumed    . apixaban  (ELIQUIS) 5 MG TABS tablet Take 1 tablet (5 mg total) by mouth 2 (two) times daily. 60 tablet 2  . atorvastatin (LIPITOR) 10 MG tablet Take 5 mg by mouth daily.    . baclofen (LIORESAL) 10 MG tablet Take 10 mg by mouth 3 (three) times daily.    Marland Kitchen buPROPion (WELLBUTRIN) 75 MG tablet Take 75 mg by mouth daily.    Marland Kitchen diltiazem (CARDIZEM) 60 MG tablet Take 1 tablet (60 mg total) by mouth every 8 (eight) hours. 90 tablet 2  . docusate (COLACE) 50 MG/5ML liquid Take by mouth daily. Give 10 mL    . hydrALAZINE (APRESOLINE) 25 MG tablet Take 25 mg by mouth 3 (three) times daily. Hold for SBP<110    . hydroxypropyl methylcellulose / hypromellose (ISOPTO TEARS / GONIOVISC) 2.5 % ophthalmic solution Place 1 drop into both eyes as needed.    Marland Kitchen lisinopril (PRINIVIL,ZESTRIL) 20 MG tablet Take 20 mg by mouth daily.    . Multiple Vitamin (MULTIVITAMIN) tablet Take 1 tablet by mouth daily.    . polyethylene  glycol (MIRALAX / GLYCOLAX) packet Take 17 g by mouth. Take every other day for constipation    . potassium chloride 20 MEQ/15ML (10%) SOLN Take 15 mLs (20 mEq total) by mouth 2 (two) times daily. 450 mL 0  . UNABLE TO FIND Med Name: Med pass 120 mL by mouth daily    . ergocalciferol (VITAMIN D2) 50000 units capsule Take by mouth.    . Garlic Oil 2 MG CAPS Take by mouth.     No current facility-administered medications on file prior to visit.     Allergies:   Allergies  Allergen Reactions  . Penicillins Rash    No other information available at this time  . Aspirin Other (See Comments)    Burning in stomach  . Morphine Other (See Comments)    delusions    Physical Exam General: frail  elderly African-American lady sitting in a wheelchair, seated, in no evident distress Head: head normocephalic and atraumatic.  Neck: supple with no carotid or supraclavicular bruits Cardiovascular: regular rate and rhythm, no murmurs Musculoskeletal: no deformity Skin:  no rash/petichiae Vascular:  Normal pulses  all extremities She has a indwelling Foley catheter. Vitals:   03/29/17 1341  BP: (!) 183/114  Pulse: 67   Neurologic Exam Mental Status: Awake and fully alert. Moderate expressive and mild receptive aphasia with significant word finding difficulties and disfluent speech. She can be understood with some difficulties and can speak short sentences at times clearly. Diminished attention. Good repetition. Poor naming. Mild dysarthria.  Cranial Nerves: Fundoscopic exam not done Pupils equal, briskly reactive to light. Extraocular movements full without nystagmus. Visual fields decreased blink to threat on the right compared to the left.Marland Kitchen Hearing intact. Facial sensation intact. Face, tongue, palate moves normally and symmetrically.  Motor: Spastic right hemiplegia with 0/5 strength on the right with non-fixed flexion contracture at the right wrist and hand and increased tone on the right side throughout. Right foot drop Normal strength on the left. Sensory.: intact to touch ,pinprick .position and vibratory sensation.  Coordination: Rapid alternating movements normal in left extremities and unable to test on the right. Gait and Station: Not tested as patient is unable to even stand and walk with therapist. She is in a beachchair  Reflexes: 2+ and asymmetric brisker on the right side. Toes downgoing.   NIHSS  12 Modified Rankin  4   ASSESSMENT: 5 year African-American lady with embolic left MCA infarct in March 2017 secondary to terminal left ICA occlusion from atrial fibrillation treated with mechanical embolectomy and intra-arterial TPA with the revascularization. Unfortunately patient continues to have significant residual aphasia and spastic right hemiplegia. She also has significants stroke depression and now concern for possible new headaches    PLAN: I had a long discussion with the patient and her guardian Rennis Petty regarding her new headache for the last 2 weeks. Patient has significant  aphasia and is unable to give enough history. She also clearly has significant post stroke depression despite being on Wellbutrin. I recommend a trial of Depakote ER 500 mg daily to help both with headache as well as depression. Check CT scan of the head today. Continuing ongoing medical management with eliquis for stroke prevention for atrial fibrillation and strict control of hypertension with blood pressure goal below 130/90 and lipids with LDL cholesterol goal below 70 mg percent. Unfortunately the patient has had a disabling stroke and not made much improvement in her aphasia and right hemiplegia. Follow-up in the future only as  necessaryGreater than 50% of time during this 25 minute visit was spent on counseling,explanation of diagnosis, planning of further management, discussion with patient and family and coordination of care Antony Contras, MD  [facility] [address] [place], [place] [address]  Phone [phone] Fax [phone] Note: This document was prepared with digital dictation and possible smart phrase technology. Any transcriptional errors that result from this process are unintentional

## 2017-03-29 NOTE — Patient Instructions (Signed)
I had a long discussion with the patient and her guardian said wired regarding her new headache for the last 2 weeks. Patient has significant aphasia and is unable to give enough history. She also clearly has significant post stroke depression despite being on Wellbutrin. I recommend a trial of Depakote ER 500 mg daily to help both with headache as well as depression. Check CT scan of the head today. Continuing ongoing medical management with eliquis for stroke prevention for atrial fibrillation and strict control of hypertension with blood pressure goal below 130/90 and lipids with LDL cholesterol goal below 70 mg percent. Unfortunately the patient hasn't had a disabling stroke and not made much improvement in her aphasia and right hemiplegia. Follow-up in the future only as necessary

## 2017-04-01 ENCOUNTER — Ambulatory Visit (HOSPITAL_COMMUNITY)
Admission: RE | Admit: 2017-04-01 | Discharge: 2017-04-01 | Disposition: A | Discharge status: Home / Self Care | Payer: Medicare Other | Source: Ambulatory Visit | Attending: Neurology | Admitting: Neurology

## 2017-04-01 DIAGNOSIS — Z8673 Personal history of transient ischemic attack (TIA), and cerebral infarction without residual deficits: Secondary | ICD-10-CM | POA: Diagnosis present

## 2017-04-01 DIAGNOSIS — G441 Vascular headache, not elsewhere classified: Secondary | ICD-10-CM

## 2017-04-01 DIAGNOSIS — R51 Headache: Secondary | ICD-10-CM | POA: Insufficient documentation

## 2017-04-07 ENCOUNTER — Telehealth: Payer: Self-pay

## 2017-04-07 NOTE — Telephone Encounter (Signed)
-----   Message from Garvin Fila, MD sent at 04/01/2017  3:14 PM EST ----- Kindly inform the patient that CT scan of the head showed age-related shrinkage of the brain and old strokes on the left side no new or worrisome finding. Expected slight worsening of brain shrinkage compared with previous CT scan from 2 years ago

## 2017-04-07 NOTE — Telephone Encounter (Signed)
Notes recorded by Marval Regal, RN on 04/07/2017 at 9:37 AM EST Left vm for [facility] on dpr to call back about pts CT scan. ------

## 2017-04-12 NOTE — Telephone Encounter (Signed)
Notes recorded by Marval Regal, RN on 04/12/2017 at 10:22 AM EST Left second vm for patients Brianna POA to call back about pts CT scan results. ------

## 2017-04-12 NOTE — Telephone Encounter (Signed)
Notes recorded by Marval Regal, RN on 04/12/2017 at 11:07 AM EST Rn receive call from granddaughter about Ct scan. Rn stated the patient that CT scan of the head showed age-related shrinkage of the brain and old strokes on the left side no new or worrisome finding. Expected slight worsening of brain shrinkage compared with previous CT scan from 2 years ago. Pts granddaughter verbalized understanding

## 2017-08-03 ENCOUNTER — Inpatient Hospital Stay (HOSPITAL_COMMUNITY)
Admission: EM | Admit: 2017-08-03 | Discharge: 2017-08-06 | DRG: 100 | Disposition: A | Discharge status: Skilled Nursing Facility | Payer: Medicare Other | Attending: Family Medicine | Admitting: Family Medicine

## 2017-08-03 ENCOUNTER — Encounter (HOSPITAL_COMMUNITY): Payer: Self-pay | Admitting: Emergency Medicine

## 2017-08-03 ENCOUNTER — Emergency Department (HOSPITAL_COMMUNITY): Payer: Medicare Other

## 2017-08-03 DIAGNOSIS — N390 Urinary tract infection, site not specified: Secondary | ICD-10-CM | POA: Diagnosis present

## 2017-08-03 DIAGNOSIS — I10 Essential (primary) hypertension: Secondary | ICD-10-CM | POA: Diagnosis present

## 2017-08-03 DIAGNOSIS — Z880 Allergy status to penicillin: Secondary | ICD-10-CM | POA: Diagnosis not present

## 2017-08-03 DIAGNOSIS — Z7901 Long term (current) use of anticoagulants: Secondary | ICD-10-CM

## 2017-08-03 DIAGNOSIS — F329 Major depressive disorder, single episode, unspecified: Secondary | ICD-10-CM | POA: Diagnosis present

## 2017-08-03 DIAGNOSIS — I69351 Hemiplegia and hemiparesis following cerebral infarction affecting right dominant side: Secondary | ICD-10-CM

## 2017-08-03 DIAGNOSIS — Z8249 Family history of ischemic heart disease and other diseases of the circulatory system: Secondary | ICD-10-CM | POA: Diagnosis not present

## 2017-08-03 DIAGNOSIS — Z853 Personal history of malignant neoplasm of breast: Secondary | ICD-10-CM

## 2017-08-03 DIAGNOSIS — G92 Toxic encephalopathy: Secondary | ICD-10-CM | POA: Diagnosis present

## 2017-08-03 DIAGNOSIS — Z66 Do not resuscitate: Secondary | ICD-10-CM | POA: Diagnosis present

## 2017-08-03 DIAGNOSIS — R569 Unspecified convulsions: Secondary | ICD-10-CM

## 2017-08-03 DIAGNOSIS — Z886 Allergy status to analgesic agent status: Secondary | ICD-10-CM

## 2017-08-03 DIAGNOSIS — E119 Type 2 diabetes mellitus without complications: Secondary | ICD-10-CM | POA: Diagnosis present

## 2017-08-03 DIAGNOSIS — I639 Cerebral infarction, unspecified: Secondary | ICD-10-CM | POA: Diagnosis not present

## 2017-08-03 DIAGNOSIS — I69320 Aphasia following cerebral infarction: Secondary | ICD-10-CM | POA: Diagnosis not present

## 2017-08-03 DIAGNOSIS — R1310 Dysphagia, unspecified: Secondary | ICD-10-CM | POA: Diagnosis present

## 2017-08-03 DIAGNOSIS — I69391 Dysphagia following cerebral infarction: Secondary | ICD-10-CM

## 2017-08-03 DIAGNOSIS — I480 Paroxysmal atrial fibrillation: Secondary | ICD-10-CM | POA: Diagnosis present

## 2017-08-03 DIAGNOSIS — G40101 Localization-related (focal) (partial) symptomatic epilepsy and epileptic syndromes with simple partial seizures, not intractable, with status epilepticus: Principal | ICD-10-CM | POA: Diagnosis present

## 2017-08-03 DIAGNOSIS — I482 Chronic atrial fibrillation, unspecified: Secondary | ICD-10-CM | POA: Diagnosis present

## 2017-08-03 DIAGNOSIS — Z823 Family history of stroke: Secondary | ICD-10-CM

## 2017-08-03 DIAGNOSIS — E872 Acidosis, unspecified: Secondary | ICD-10-CM | POA: Diagnosis present

## 2017-08-03 DIAGNOSIS — Z885 Allergy status to narcotic agent status: Secondary | ICD-10-CM | POA: Diagnosis not present

## 2017-08-03 DIAGNOSIS — Z90710 Acquired absence of both cervix and uterus: Secondary | ICD-10-CM

## 2017-08-03 DIAGNOSIS — E785 Hyperlipidemia, unspecified: Secondary | ICD-10-CM | POA: Diagnosis present

## 2017-08-03 DIAGNOSIS — G9341 Metabolic encephalopathy: Secondary | ICD-10-CM | POA: Diagnosis present

## 2017-08-03 LAB — CBC WITH DIFFERENTIAL/PLATELET
Basophils Absolute: 0 10*3/uL (ref 0.0–0.1)
Basophils Relative: 0 %
EOS ABS: 0 10*3/uL (ref 0.0–0.7)
EOS PCT: 0 %
HCT: 44 % (ref 36.0–46.0)
HEMOGLOBIN: 14.3 g/dL (ref 12.0–15.0)
LYMPHS ABS: 1.6 10*3/uL (ref 0.7–4.0)
LYMPHS PCT: 21 %
MCH: 31.2 pg (ref 26.0–34.0)
MCHC: 32.5 g/dL (ref 30.0–36.0)
MCV: 96.1 fL (ref 78.0–100.0)
MONOS PCT: 5 %
Monocytes Absolute: 0.4 10*3/uL (ref 0.1–1.0)
NEUTROS PCT: 74 %
Neutro Abs: 5.8 10*3/uL (ref 1.7–7.7)
Platelets: 203 10*3/uL (ref 150–400)
RBC: 4.58 MIL/uL (ref 3.87–5.11)
RDW: 12.8 % (ref 11.5–15.5)
WBC: 7.9 10*3/uL (ref 4.0–10.5)

## 2017-08-03 LAB — COMPREHENSIVE METABOLIC PANEL
ALK PHOS: 93 U/L (ref 38–126)
ALT: 15 U/L (ref 14–54)
ANION GAP: 14 (ref 5–15)
AST: 19 U/L (ref 15–41)
Albumin: 3.3 g/dL — ABNORMAL LOW (ref 3.5–5.0)
BUN: 19 mg/dL (ref 6–20)
CALCIUM: 8.7 mg/dL — AB (ref 8.9–10.3)
CO2: 21 mmol/L — ABNORMAL LOW (ref 22–32)
CREATININE: 0.75 mg/dL (ref 0.44–1.00)
Chloride: 104 mmol/L (ref 101–111)
Glucose, Bld: 219 mg/dL — ABNORMAL HIGH (ref 65–99)
Potassium: 3.5 mmol/L (ref 3.5–5.1)
SODIUM: 139 mmol/L (ref 135–145)
TOTAL PROTEIN: 7 g/dL (ref 6.5–8.1)
Total Bilirubin: 0.5 mg/dL (ref 0.3–1.2)

## 2017-08-03 LAB — CK: Total CK: 37 U/L — ABNORMAL LOW (ref 38–234)

## 2017-08-03 LAB — CBG MONITORING, ED: GLUCOSE-CAPILLARY: 225 mg/dL — AB (ref 65–99)

## 2017-08-03 LAB — I-STAT TROPONIN, ED: TROPONIN I, POC: 0 ng/mL (ref 0.00–0.08)

## 2017-08-03 LAB — I-STAT CG4 LACTIC ACID, ED: LACTIC ACID, VENOUS: 4.63 mmol/L — AB (ref 0.5–1.9)

## 2017-08-03 MED ORDER — LEVETIRACETAM IN NACL 1000 MG/100ML IV SOLN
1000.0000 mg | Freq: Two times a day (BID) | INTRAVENOUS | Status: DC
  Filled 2017-08-03: qty 100

## 2017-08-03 MED ORDER — ALBUTEROL SULFATE (2.5 MG/3ML) 0.083% IN NEBU: 2.5000 mg | INHALATION_SOLUTION | RESPIRATORY_TRACT | Status: DC | PRN

## 2017-08-03 MED ORDER — HYDRALAZINE HCL 20 MG/ML IJ SOLN: 5.0000 mg | INTRAMUSCULAR | Status: DC | PRN

## 2017-08-03 MED ORDER — LEVETIRACETAM IN NACL 1000 MG/100ML IV SOLN
1000.0000 mg | Freq: Once | INTRAVENOUS | Status: AC
  Administered 2017-08-03: 1000 mg via INTRAVENOUS
  Filled 2017-08-03: qty 100

## 2017-08-03 MED ORDER — INSULIN ASPART 100 UNIT/ML ~~LOC~~ SOLN
0.0000 [IU] | Freq: Every day | SUBCUTANEOUS | Status: DC
  Administered 2017-08-04: 2 [IU] via SUBCUTANEOUS
  Filled 2017-08-03: qty 1

## 2017-08-03 MED ORDER — ACETAMINOPHEN 650 MG RE SUPP: 650.0000 mg | Freq: Four times a day (QID) | RECTAL | Status: DC | PRN

## 2017-08-03 MED ORDER — SODIUM CHLORIDE 0.9 % IV SOLN
INTRAVENOUS | Status: DC
Start: 2017-08-03 — End: 2017-08-05
  Administered 2017-08-04 (×2): via INTRAVENOUS

## 2017-08-03 MED ORDER — LORAZEPAM 2 MG/ML IJ SOLN
INTRAMUSCULAR | Status: AC
  Filled 2017-08-03: qty 1

## 2017-08-03 MED ORDER — LORAZEPAM 2 MG/ML IJ SOLN: 1.0000 mg | INTRAMUSCULAR | Status: DC | PRN

## 2017-08-03 MED ORDER — ONDANSETRON HCL 4 MG/2ML IJ SOLN: 4.0000 mg | Freq: Three times a day (TID) | INTRAMUSCULAR | Status: DC | PRN

## 2017-08-03 MED ORDER — HEPARIN SODIUM (PORCINE) 5000 UNIT/ML IJ SOLN
5000.0000 [IU] | Freq: Three times a day (TID) | INTRAMUSCULAR | Status: DC
  Administered 2017-08-04 – 2017-08-05 (×5): 5000 [IU] via SUBCUTANEOUS
  Filled 2017-08-03 (×5): qty 1

## 2017-08-03 MED ORDER — INSULIN ASPART 100 UNIT/ML ~~LOC~~ SOLN
0.0000 [IU] | Freq: Three times a day (TID) | SUBCUTANEOUS | Status: DC
  Administered 2017-08-04: 1 [IU] via SUBCUTANEOUS
  Administered 2017-08-04: 2 [IU] via SUBCUTANEOUS
  Administered 2017-08-05: 3 [IU] via SUBCUTANEOUS
  Administered 2017-08-05: 1 [IU] via SUBCUTANEOUS
  Administered 2017-08-05 – 2017-08-06 (×3): 2 [IU] via SUBCUTANEOUS

## 2017-08-03 NOTE — Consult Note (Signed)
Neurology Consultation Reason for Consult:Seizure, focal status  Referring Physician: Dr.Mackuen    History is obtained from: EDP, chart review   HPI: Joy Holden is a 84 y.o. female with history of HTN, Afib on Eliquis, Stroke in March 2017 with residual right hemiparesis and aphasia who lives in a nursing home brought to the emergency room after nursing staff noticed patient having right facial right arm twitching that began around 5 PM. On arrival patient was noted to have right-sided gaze deviation and was actively seizing.patient received 4 mg of Ativan and was still seizing. She received 1 g of Keppra and another 2 mg of Ativan which stopped the seizure. Head CT showed left frontal encephalomalacia. Patient is DNR/DNI.   On assessment, patient was lethargic and nonresponsive. She was withdrawing to the left side. No gaze deviation or nystagmus rhythmic jerking was noted. She appeared to be postictal. No prior history of seizures.   ROS: Unable to obtain due to altered mental status.   Past Medical History:  Diagnosis Date  . Asthma   . Cancer ([place])   . Dysphagia   . Hematuria   . Hypertension   . Hypokalemia   . PAF (paroxysmal atrial fibrillation) ([place])   . Stroke [facility])      Family History  Problem Relation Age of Onset  . Hypertension Mother   . Stroke Mother   . Cancer Sister        BREAST  . Stroke Sister      Social History:  reports that she has never smoked. She has never used smokeless tobacco. She reports that she does not drink alcohol or use drugs.   Exam: Current vital signs: BP 131/80   Pulse 83   Temp 97.9 F (36.6 C) (Rectal)   Resp (!) 22   SpO2 100%  Vital signs in last 24 hours: Temp:  [97.9 F (36.6 C)] 97.9 F (36.6 C) (04/10 2100) Pulse Rate:  [62-85] 83 (04/10 2300) Resp:  [14-22] 22 (04/10 2300) BP: (118-162)/(66-92) 131/80 (04/10 2300) SpO2:  [100 %] 100 % (04/10 2300)   Physical Exam  Constitutional: Appears well-developed  and well-nourished.  Psych: Affect appropriate to situation Eyes: No scleral injection HENT: No OP obstrucion Head: Normocephalic.  Cardiovascular: Normal rate and regular rhythm.  Respiratory: Effort normal, non-labored breathing GI: Soft.  No distension. There is no tenderness.  Skin: WDI  Neuro: Mental Status: Patient is lethargic, grimaces to pain. She is nonverbal and does not follow commands. Cranial Nerves: II: Visual Fields: unable to assess, pupils are about 2 mm bilateral and reactive III,IV, VI: no forced gaze deviation noted.  VII Facial : mild right facial droop X: unable to assess XI: unable to assess XII: tongue is midline without atrophy or fasciculations.  Motor: Increased tone in the right arm and leg, not withdrawing to pain. She withdraws to pain in left arm and left leg Sensory: Unable to test sensation Deep Tendon Reflexes: 1+ bilaterally Plantars: Toes are downgoing bilaterally Cerebellar: FNF and HKS are intact bilaterally: unable to assess     I have reviewed labs in epic and the results pertinent to this consultation are:   I ha ve reviewed the images obtained: CT head  ASSESSMENT AND PLAN 84 y.o. female with history of HTN, Afib on Eliquis, Stroke in March 2017 with residual right hemiparesis and aphasia who lives in a nursing home presents with right gaze deviation and right arm and leg jerking for a few hours concerning  for focal status epilepticus. Resolved after receiving 6 mg of  And 1 g of Keppra IV. Etiology for focal status likely from old stroke  Focal status epilepticus- resolved Continue Keppra 500mg  BID Seizure precautions Check for infection and metabolic abnormalities Routine EEG in the morning    Will continue to follow.     Karena Addison Aroor MD [facility] [phone]  If 7pm to 7am, please call on call as listed on AMION.

## 2017-08-03 NOTE — ED Notes (Signed)
Hospitalist bedside 

## 2017-08-03 NOTE — ED Provider Notes (Signed)
9:34 PM  [place] EMERGENCY DEPARTMENT Provider Note   CSN: [id number] Arrival date & time: 08/03/17  2043     History   Chief Complaint Chief Complaint  Patient presents with  . Seizures    HPI Joy Holden is a 84 y.o. female.  HPI   Patient is a 84 year old female presenting with status.  Patient is having seizures on arrival to ED.  According to EMS she had been having shaking like activity on and off for a couple hours.  Patient has right-sided gaze deviation and is actively seizing.  REportedly no history of this.  Past Medical History:  Diagnosis Date  . Asthma   . Cancer ([place])   . Dysphagia   . Hematuria   . Hypertension   . Hypokalemia   . PAF (paroxysmal atrial fibrillation) ([place])   . Stroke [facility])     Patient Active Problem List   Diagnosis Date Noted  . Chronic a-fib ([place]) 08/02/2016  . Spastic hemiplegia of right dominant side due to cerebrovascular disease ([place]) 08/02/2016  . Vascular dementia without behavioral disturbance 08/02/2016  . Atonic neurogenic bladder 12/04/2015  . Protein-calorie malnutrition ([place]) 12/04/2015  . Decreased mobility 12/04/2015  . Major depression, chronic 11/09/2015  . Aphasia complicating stroke 02/77/4128  . Spastic hemiplegia affecting nondominant side ([place]) 09/30/2015  . Right homonymous hemianopsia 07/28/2015  . Hypokalemia 07/28/2015  . Thrombocytopenia ([place]) 07/28/2015  . Paroxysmal atrial fibrillation (H[facility])   . Hyperlipidemia LDL goal <70   . Aphasia   . Right hemiplegia ([place])   . Dysphagia   . Acute ischemic stroke ([place]) - Left MCA/ACA s/p IV TPA, IA TPA and mechanical thrombectomy 07/19/2015  . Essential hypertension 02/20/2015  . 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    Past Surgical History:  Procedure Laterality Date  . ABDOMINAL HYSTERECTOMY     PARTIAL  . APPENDECTOMY    . BREAST SURGERY    . CERVICAL SPINE SURGERY    . ECTOPIC PREGNANCY SURGERY    .  MYOMECTOMY    . NECK SURGERY     c-spine  . RADIOLOGY WITH ANESTHESIA N/A 07/19/2015   Procedure: RADIOLOGY WITH ANESTHESIA;  Surgeon: Luanne Bras, MD;  Location: [place];  Service: Radiology;  Laterality: N/A;     OB History   None      Home Medications    Prior to Admission medications   Medication Sig Start Date End Date Taking? Authorizing Provider  acetaminophen (TYLENOL) 500 MG tablet Take 500 mg by mouth 2 (two) times daily.    [provider]  Amino Acids-Protein Hydrolys (FEEDING SUPPLEMENT, PRO-STAT SUGAR FREE 64,) LIQD Take 30 mLs by mouth as directed. 30 minutes prior to meals for 30 days beginning 08/12/16. Document % consumed    [provider]  apixaban (ELIQUIS) 5 MG TABS tablet Take 1 tablet (5 mg total) by mouth 2 (two) times daily. 07/28/15   Donzetta Starch, NP  atorvastatin (LIPITOR) 10 MG tablet Take 5 mg by mouth daily.    [provider]  baclofen (LIORESAL) 10 MG tablet Take 10 mg by mouth 3 (three) times daily.    [provider]  buPROPion (WELLBUTRIN) 75 MG tablet Take 75 mg by mouth daily.    [provider]  diltiazem (CARDIZEM) 60 MG tablet Take 1 tablet (60 mg total) by mouth every 8 (eight) hours. 07/28/15   Donzetta Starch, NP  divalproex (DEPAKOTE ER) 500 MG  24 hr tablet Take 1 tablet (500 mg total) by mouth daily. 03/29/17   Garvin Fila, MD  docusate (COLACE) 50 MG/5ML liquid Take by mouth daily. Give 10 mL    [provider]  ergocalciferol (VITAMIN D2) 50000 units capsule Take by mouth.    [provider]  Garlic Oil 2 MG CAPS Take by mouth.    [provider]  hydrALAZINE (APRESOLINE) 25 MG tablet Take 25 mg by mouth 3 (three) times daily. Hold for SBP<110    [provider]  hydroxypropyl methylcellulose / hypromellose (ISOPTO TEARS / GONIOVISC) 2.5 % ophthalmic solution Place 1 drop into both eyes as needed.    [provider]  lisinopril (PRINIVIL,ZESTRIL)  20 MG tablet Take 20 mg by mouth daily.    [provider]  Multiple Vitamin (MULTIVITAMIN) tablet Take 1 tablet by mouth daily.    [provider]  polyethylene glycol (MIRALAX / GLYCOLAX) packet Take 17 g by mouth. Take every other day for constipation    [provider]  potassium chloride 20 MEQ/15ML (10%) SOLN Take 15 mLs (20 mEq total) by mouth 2 (two) times daily. 07/28/15   Donzetta Starch, NP  UNABLE TO FIND Med Name: Med pass 120 mL by mouth daily    [provider]    Family History Family History  Problem Relation Age of Onset  . Hypertension Mother   . Stroke Mother   . Cancer Sister        BREAST  . Stroke Sister     Social History Social History   Tobacco Use  . Smoking status: Never Smoker  . Smokeless tobacco: Never Used  Substance Use Topics  . Alcohol use: No  . Drug use: No     Allergies   Penicillins; Aspirin; and Morphine   Review of Systems Review of Systems  Unable to perform ROS: Mental status change     Physical Exam Updated Vital Signs BP (!) 158/109 (BP Location: Right Arm)   Pulse 90   Temp 97.9 F (36.6 C) (Rectal)   Resp 19   SpO2 100%   Physical Exam  Constitutional: She appears well-developed and well-nourished.  HENT:  Head: Normocephalic and atraumatic.  Right-sided eye deviation.  Eyes: Right eye exhibits no discharge. Left eye exhibits no discharge.  Cardiovascular: Normal rate.  Pulmonary/Chest: Effort normal.  Neurological:  Actively seizing right arm contracted right eye deviation.  Skin: Skin is warm and dry. She is not diaphoretic.  Nursing note and vitals reviewed.    ED Treatments / Results  Labs (all labs ordered are listed, but only abnormal results are displayed) Labs Reviewed  CBG MONITORING, ED - Abnormal; Notable for the following components:      Result Value   Glucose-Capillary 225 (*)    All other components within normal limits  CBC WITH DIFFERENTIAL/PLATELET    COMPREHENSIVE METABOLIC PANEL  URINALYSIS, ROUTINE W REFLEX MICROSCOPIC  I-STAT TROPONIN, ED  I-STAT CG4 LACTIC ACID, ED    EKG EKG Interpretation  Date/Time:  Wednesday August 03 2017 20:47:06 EDT Ventricular Rate:  100 PR Interval:    QRS Duration: 94 QT Interval:  377 QTC Calculation: 487 R Axis:   10 Text Interpretation:  Atrial fibrillation Borderline repolarization abnormality Borderline prolonged QT interval Atrial fibrillation Confirmed by Thomasene Lot, Stewart Sasaki ([id number]) on 08/03/2017 9:09:47 PM   Radiology No results found.  Procedures Procedures (including critical care time)  CRITICAL CARE Performed by: Gardiner Sleeper Total critical  care time: 60 minutes Critical care time was exclusive of separately billable procedures and treating other patients. Critical care was necessary to treat or prevent imminent or life-threatening deterioration. Critical care was time spent personally by me on the following activities: development of treatment plan with patient and/or surrogate as well as nursing, discussions with consultants, evaluation of patient's response to treatment, examination of patient, obtaining history from patient or surrogate, ordering and performing treatments and interventions, ordering and review of laboratory studies, ordering and review of radiographic studies, pulse oximetry and re-evaluation of patient's condition.   Medications Ordered in ED Medications  LORazepam (ATIVAN) 2 MG/ML injection (has no administration in time range)  levETIRAcetam (KEPPRA) IVPB 1000 mg/100 mL premix (1,000 mg Intravenous New Bag/Given 08/03/17 2102)  LORazepam (ATIVAN) 2 MG/ML injection (has no administration in time range)     Initial Impression / Assessment and Plan / ED Course  I have reviewed the triage vital signs and the nursing notes.  Pertinent labs & imaging results that were available during my care of the patient were reviewed by me and considered in my  medical decision making (see chart for details).     Patient is a 84 year old female presenting with status.  Patient is having seizures on arrival to ED.  According to EMS she had been having shaking like activity on and off for a couple hours.  Patient has right-sided gaze deviation and is actively seizing.  REportedly no history of this.  9:10 PM Patient given 2 of Ativan with no response.  Given 2 additional of Ativan.  Patient started on Keppra.  Patient still altered/seizing.  Had a discussion with patient's daughter on the phone, she is DNR/DNI.  Will let Keppra take effect.  I  9:34 PM After third round of antiepileptic, patient stopped seizing.  Family at bedside.  Discussion had and will continue with the DNR/DNI.  Discussed potentially moving to comfort care depending findings on exam.  Will discuss with neurology.  Awaiting results of CT head, labs.  Final Clinical Impressions(s) / ED Diagnoses   Final diagnoses:  None    ED Discharge Orders    None       Roseline Ebarb, Fredia Sorrow, MD 08/07/17 1540

## 2017-08-03 NOTE — ED Notes (Signed)
No abnormal movements noted to right arm/face at this time.

## 2017-08-03 NOTE — ED Triage Notes (Signed)
Pt BIB [facility] from [facility] for seizures and AMS. Pt LKW unknown, staff noted pt had right facial and arm twitching at 1700 today. At baseline, pt able to follow simple commands and answer yes/no questions. Pt given 5mg  versed IM PTA. Pt on NRB upon arrival to ED. Given 2mg  ativan per verbal order Dr. Thomasene Lot at 2048. Right hand twitching continued.

## 2017-08-03 NOTE — ED Notes (Addendum)
Xray currently in room. 

## 2017-08-03 NOTE — ED Notes (Signed)
2mg  ativan IV given per verbal order Dr. Thomasene Lot.

## 2017-08-03 NOTE — ED Notes (Signed)
Joy Holden, daughter in law, [phone], requesting to be called when pt transfers from ED.

## 2017-08-03 NOTE — H&P (Addendum)
History and Physical    Joy Holden ZHG:[id number] DOB: 09/14/1924 DOA: 08/03/2017  Referring MD/NP/PA:   PCP: Patient, No Pcp Per   Patient coming from:  The patient is coming from SNF.  At baseline, pt is dependent for most of ADL.   Chief Complaint: seizure  HPI: Joy Holden is a 84 y.o. female with medical history significant of stroke with right-sided weakness, hypertension, hyperlipoidemia asthma, depression, atrial fibrillation on Eliquis, DCIS breast cancer, who presents with seizures.  Patient had had seizure and worsening mental status, not responsive. Pt in unable to provide any medical history, therefore, most of the history is obtained by discussing the case with ED physician, per EMS report, and with the nursing staff and family members.   Per report, was noted to have left facial and right arm incision at about 5:00 PM. LKW was unknown. Her blood pressure wa reportedly elevated. When I saw in in ED. She is not responsive and not arousable. No active respiratory distress, cough, nausea, vomiting, diarrhea noted. She does not move her extremities. Not sure if she has any pain or any other symptoms. The patient is taking Depakote for unknown reason. Family members and her power of attorney do not know why patient is taking Depakote. Per family, at baseline, pt was able to follow simple commands and answer yes/no questions. She could fee herself with left hand. Pt given 5 mg versed IM PTA. Was given 2 mg ativan in ED.   ED Course: pt was found to have WBC 7.9, negative troponin, electrolytes renal function okay, lactic acid 4.63, negative troponin, electrolytes renal function okay, temperature normal, no tachycardia, no tachypnea, oxygen saturation 100%, negative chest x-ray. CT head showed prominent left ACA territory encephalomalacia, but no intracranial bleeding. Patient is admitted to telemetry bed as inpatient. Neurology, Dr. Rory Percy was consulted.  Review of Systems: could  not be reviewed due to unresponsiveness.  Allergy:  Allergies  Allergen Reactions  . Penicillins Rash    No other information available at this time  . Aspirin Other (See Comments)    Burning in stomach  . Morphine Other (See Comments)    delusions    Past Medical History:  Diagnosis Date  . Asthma   . Cancer ([place])   . Dysphagia   . Hematuria   . Hypertension   . Hypokalemia   . PAF (paroxysmal atrial fibrillation) ([place])   . Stroke [facility])     Past Surgical History:  Procedure Laterality Date  . ABDOMINAL HYSTERECTOMY     PARTIAL  . APPENDECTOMY    . BREAST SURGERY    . CERVICAL SPINE SURGERY    . ECTOPIC PREGNANCY SURGERY    . MYOMECTOMY    . NECK SURGERY     c-spine  . RADIOLOGY WITH ANESTHESIA N/A 07/19/2015   Procedure: RADIOLOGY WITH ANESTHESIA;  Surgeon: Luanne Bras, MD;  Location: [place];  Service: Radiology;  Laterality: N/A;    Social History:  reports that she has never smoked. She has never used smokeless tobacco. She reports that she does not drink alcohol or use drugs.  Family History:  Family History  Problem Relation Age of Onset  . Hypertension Mother   . Stroke Mother   . Cancer Sister        BREAST  . Stroke Sister      Prior to Admission medications   Medication Sig Start Date End Date Taking? Authorizing Provider  acetaminophen (TYLENOL) 500 MG tablet Take 500  mg by mouth 2 (two) times daily.    [provider]  Amino Acids-Protein Hydrolys (FEEDING SUPPLEMENT, PRO-STAT SUGAR FREE 64,) LIQD Take 30 mLs by mouth as directed. 30 minutes prior to meals for 30 days beginning 08/12/16. Document % consumed    [provider]  apixaban (ELIQUIS) 5 MG TABS tablet Take 1 tablet (5 mg total) by mouth 2 (two) times daily. 07/28/15   Donzetta Starch, NP  atorvastatin (LIPITOR) 10 MG tablet Take 5 mg by mouth daily.    [provider]  baclofen (LIORESAL) 10 MG tablet Take 10 mg by mouth 3 (three) times daily.    [provider]  buPROPion (WELLBUTRIN) 75 MG tablet Take 75 mg by mouth daily.    [provider]  diltiazem (CARDIZEM) 60 MG tablet Take 1 tablet (60 mg total) by mouth every 8 (eight) hours. 07/28/15   Donzetta Starch, NP  divalproex (DEPAKOTE ER) 500 MG 24 hr tablet Take 1 tablet (500 mg total) by mouth daily. 03/29/17   Garvin Fila, MD  docusate (COLACE) 50 MG/5ML liquid Take by mouth daily. Give 10 mL    [provider]  ergocalciferol (VITAMIN D2) 50000 units capsule Take by mouth.    [provider]  Garlic Oil 2 MG CAPS Take by mouth.    [provider]  hydrALAZINE (APRESOLINE) 25 MG tablet Take 25 mg by mouth 3 (three) times daily. Hold for SBP<110    [provider]  hydroxypropyl methylcellulose / hypromellose (ISOPTO TEARS / GONIOVISC) 2.5 % ophthalmic solution Place 1 drop into both eyes as needed.    [provider]  lisinopril (PRINIVIL,ZESTRIL) 20 MG tablet Take 20 mg by mouth daily.    [provider]  Multiple Vitamin (MULTIVITAMIN) tablet Take 1 tablet by mouth daily.    [provider]  polyethylene glycol (MIRALAX / GLYCOLAX) packet Take 17 g by mouth. Take every other day for constipation    [provider]  potassium chloride 20 MEQ/15ML (10%) SOLN Take 15 mLs (20 mEq total) by mouth 2 (two) times daily. 07/28/15   Donzetta Starch, NP  UNABLE TO FIND Med Name: Med pass 120 mL by mouth daily    [provider]    Physical Exam: Vitals:   08/03/17 2315 08/03/17 2330 08/03/17 2345 08/04/17 0000  BP: 116/73 134/84 127/87 134/89  Pulse: 79 93 80 76  Resp: 15 (!) 30 13 14   Temp:      TempSrc:      SpO2: 100% 100% 100% 100%   General: Not in acute distress HEENT:       Eyes: pinpoint pupils, not reactive to the light, no scleral icterus.       ENT: No discharge from the ears and nose      Neck: No JVD, no bruit, no mass felt. Heme: No neck lymph node enlargement. Cardiac: S1/S2,  RRR, No murmurs, No gallops or rubs. Respiratory: No rales, wheezing, rhonchi or rubs. GI: Soft, nondistended, no rebound pain, no organomegaly, BS present. GU: No hematuria Ext: No pitting leg edema bilaterally. 2+DP/PT pulse bilaterally. Musculoskeletal: No joint deformities, No joint redness or warmth, no limitation of ROM in spin. Skin: No rashes.  Neuro: unresponsiveness, not moving extremities, not arousable Psych: Patient is not psychotic.  Labs on Admission: I have personally reviewed following labs and imaging studies  CBC: Recent Labs  Lab 08/03/17 2116  WBC 7.9  NEUTROABS 5.8  HGB 14.3  HCT 44.0  MCV 96.1  PLT 846   Basic Metabolic Panel: Recent Labs  Lab 08/03/17 2116  NA 139  K 3.5  CL 104  CO2 21*  GLUCOSE 219*  BUN 19  CREATININE 0.75  CALCIUM 8.7*   GFR: CrCl cannot be calculated (Unknown ideal weight.). Liver Function Tests: Recent Labs  Lab 08/03/17 2116  AST 19  ALT 15  ALKPHOS 93  BILITOT 0.5  PROT 7.0  ALBUMIN 3.3*   No results for input(s): LIPASE, AMYLASE in the last 168 hours. No results for input(s): AMMONIA in the last 168 hours. Coagulation Profile: No results for input(s): INR, PROTIME in the last 168 hours. Cardiac Enzymes: Recent Labs  Lab 08/03/17 2331  CKTOTAL 37*   BNP (last 3 results) No results for input(s): PROBNP in the last 8760 hours. HbA1C: No results for input(s): HGBA1C in the last 72 hours. CBG: Recent Labs  Lab 08/03/17 2049 08/03/17 2359  GLUCAP 225* 222*   Lipid Profile: No results for input(s): CHOL, HDL, LDLCALC, TRIG, CHOLHDL, LDLDIRECT in the last 72 hours. Thyroid Function Tests: No results for input(s): TSH, T4TOTAL, FREET4, T3FREE, THYROIDAB in the last 72 hours. Anemia Panel: No results for input(s): VITAMINB12, FOLATE, FERRITIN, TIBC, IRON, RETICCTPCT in the last 72 hours. Urine analysis:    Component Value Date/Time   COLORURINE YELLOW 08/04/2017 0018   APPEARANCEUR CLOUDY (A)  08/04/2017 0018   LABSPEC 1.015 08/04/2017 0018   PHURINE 5.0 08/04/2017 0018   GLUCOSEU 150 (A) 08/04/2017 0018   HGBUR LARGE (A) 08/04/2017 0018   BILIRUBINUR NEGATIVE 08/04/2017 0018   KETONESUR 5 (A) 08/04/2017 0018   PROTEINUR 100 (A) 08/04/2017 0018   NITRITE POSITIVE (A) 08/04/2017 0018   LEUKOCYTESUR LARGE (A) 08/04/2017 0018   Sepsis Labs: @LABRCNTIP (procalcitonin:4,lacticidven:4) )No results found for this or any previous visit (from the past 240 hour(s)).   Radiological Exams on Admission: Ct Head Wo Contrast  Result Date: 08/03/2017 CLINICAL DATA:  Right facial and upper extremity twitching. History of CVA. EXAM: CT HEAD WITHOUT CONTRAST TECHNIQUE: Contiguous axial images were obtained from the base of the skull through the vertex without intravenous contrast. COMPARISON:  04/01/2017 head CT. FINDINGS: Brain: Stable prominent encephalomalacia in the left ACA territory involving the left frontal lobe and left basal ganglia with ex vacuo dilatation of the frontal horn of the left lateral ventricle. No evidence of parenchymal hemorrhage or extra-axial fluid collection. No mass lesion, mass effect, or midline shift. No CT evidence of acute infarction. Nonspecific moderate subcortical and periventricular white matter hypodensity, most in keeping with chronic small vessel ischemic change. No acute ventriculomegaly. Vascular: No acute abnormality. Skull: No evidence of calvarial fracture. Sinuses/Orbits: The visualized paranasal sinuses are essentially clear. Other: Tiny inferior left mastoid effusion. Clear right mastoid air cells. IMPRESSION: 1.  No evidence of acute intracranial abnormality. 2. Prominent left ACA territory encephalomalacia. 3. Moderate chronic small vessel ischemic changes in the cerebral white matter. 4. Tiny nonspecific left mastoid effusion. Electronically Signed   By: Ilona Sorrel M.D.   On: 08/03/2017 21:39   Dg Chest Portable 1 View  Result Date:  08/03/2017 CLINICAL DATA:  Shortness of breath. EXAM: PORTABLE CHEST 1 VIEW COMPARISON:  Chest radiograph July 20, 2015 FINDINGS: The cardiac silhouette is mildly enlarged and unchanged. Calcified aortic knob. Mild chronic interstitial changes with strandy LEFT lung base and RIGHT midlung zone densities. No pleural effusion or focal consolidation. No pneumothorax. Osteopenia. Broad dextroscoliosis. IMPRESSION: Mild cardiomegaly and chronic interstitial changes.  Bilateral atelectasis/scarring. Aortic Atherosclerosis (ICD10-I70.0). Electronically Signed   By: Elon Alas M.D.   On: 08/03/2017 21:18     EKG: Independently reviewed.  Atrial fibrillation, QTC 487, nonspecific T-wave change.  Assessment/Plan Principal Problem:   Seizure ([place]) Active Problems:   Essential hypertension   Hyperlipidemia LDL goal <70   UTI (urinary tract infection)   Major depression, chronic   Chronic a-fib (HCC)   Stroke (HCC)   Diabetes mellitus without complication (HCC)   Lactic acid acidosis   Acute metabolic encephalopathy   Seizure ([place]): likely 2/2 to previous stroke. CT head showed prominent left ACA territory encephalomalacia. Pt was loaded with 1 g of Keppra in ED. Dr. Rory Percy of neuro was consulted.  -will admit to tele bed as inpt -Seizure precaution -When necessary Ativan for seizure -check Keppra valproic acid level -Continue IV Keppra  Acute metabolic encephalopathy: worsening mental status 2/2 to seizure and sedative meds -neuro check frequently -hold oral meds -f/u UA  Addendum: UA positive for UTI -start Aztreonam -f/u Bx and Ux  HTN:  -hold home medications: -IV hydralazine prn  hyperlipidemia LDL goal <70: -Hold lipitor  Major depression, chronic: -hold home med, Wellbutrin  Stroke [facility]): was on Eliquis for a fib -hold Eliquis  Diabetes mellitus without complication ([place]): Last A1c 6.5 on 03/31/16, not controled. Patient is taking metformin at home -SSI  Lactic  acid acidosis: 2/2 to seizure. -IV NS 100 cc/h  Atrial Fibrillation: CHA2DS2-VASc Score is 7, needs oral anticoagulation. Patient is on Eliquis at home. Heart rate is well controlled. -hold oral cardizem and Eliquis -start IV metoprolol 2.5 mg every 8 hours    DVT ppx: SQ Heparin      Code Status: DNR (pt has yellow paper from SNF indicating DNR. I also confirmed DNR with her POA and her daughter-in-law and nephew)  Family Communication:  Yes, patient's power of attorney, daughter-in-law and the nephew at bed side Disposition Plan:  Anticipate discharge back to previous SNF Consults called:  Neuro, dr. Rory Percy Admission status:  Inpatient/tele    Date of Service 08/04/2017    Ivor Costa [facility] Pager [phone]  If 7PM-7AM, please contact night-coverage [email or web address] Password [facility] 08/04/2017, 12:47 AM

## 2017-08-04 ENCOUNTER — Inpatient Hospital Stay (HOSPITAL_COMMUNITY): Payer: Medicare Other

## 2017-08-04 DIAGNOSIS — G9341 Metabolic encephalopathy: Secondary | ICD-10-CM | POA: Diagnosis present

## 2017-08-04 DIAGNOSIS — R569 Unspecified convulsions: Secondary | ICD-10-CM

## 2017-08-04 LAB — BASIC METABOLIC PANEL
ANION GAP: 12 (ref 5–15)
BUN: 18 mg/dL (ref 6–20)
CO2: 23 mmol/L (ref 22–32)
Calcium: 8.9 mg/dL (ref 8.9–10.3)
Chloride: 104 mmol/L (ref 101–111)
Creatinine, Ser: 0.65 mg/dL (ref 0.44–1.00)
GFR calc non Af Amer: >60 mL/min (ref >60)
Glucose, Bld: 180 mg/dL — ABNORMAL HIGH (ref 65–99)
POTASSIUM: 4 mmol/L (ref 3.5–5.1)
Sodium: 139 mmol/L (ref 135–145)

## 2017-08-04 LAB — CBG MONITORING, ED: GLUCOSE-CAPILLARY: 222 mg/dL — AB (ref 65–99)

## 2017-08-04 LAB — URINALYSIS, ROUTINE W REFLEX MICROSCOPIC
Bilirubin Urine: NEGATIVE
GLUCOSE, UA: 150 mg/dL — AB
KETONES UR: 5 mg/dL — AB
Nitrite: POSITIVE — AB
PROTEIN: 100 mg/dL — AB
Specific Gravity, Urine: 1.015 (ref 1.005–1.030)
pH: 5 (ref 5.0–8.0)

## 2017-08-04 LAB — GLUCOSE, CAPILLARY
GLUCOSE-CAPILLARY: 121 mg/dL — AB (ref 65–99)
Glucose-Capillary: 171 mg/dL — ABNORMAL HIGH (ref 65–99)
Glucose-Capillary: 126 mg/dL — ABNORMAL HIGH (ref 65–99)
Glucose-Capillary: 111 mg/dL — ABNORMAL HIGH (ref 65–99)

## 2017-08-04 LAB — CBC
HEMATOCRIT: 46.5 % — AB (ref 36.0–46.0)
HEMOGLOBIN: 15 g/dL (ref 12.0–15.0)
MCH: 31.1 pg (ref 26.0–34.0)
MCHC: 32.3 g/dL (ref 30.0–36.0)
MCV: 96.5 fL (ref 78.0–100.0)
Platelets: 194 10*3/uL (ref 150–400)
RBC: 4.82 MIL/uL (ref 3.87–5.11)
RDW: 12.8 % (ref 11.5–15.5)
WBC: 8.6 10*3/uL (ref 4.0–10.5)

## 2017-08-04 LAB — MRSA PCR SCREENING: MRSA by PCR: NEGATIVE

## 2017-08-04 LAB — VALPROIC ACID LEVEL: VALPROIC ACID LVL: 33 ug/mL — AB (ref 50.0–100.0)

## 2017-08-04 LAB — HEMOGLOBIN A1C
HEMOGLOBIN A1C: 9 % — AB (ref 4.8–5.6)
Mean Plasma Glucose: 211.6 mg/dL

## 2017-08-04 MED ORDER — ORAL CARE MOUTH RINSE
15.0000 mL | Freq: Two times a day (BID) | OROMUCOSAL | Status: DC
  Administered 2017-08-04 – 2017-08-06 (×5): 15 mL via OROMUCOSAL

## 2017-08-04 MED ORDER — METOPROLOL TARTRATE 5 MG/5ML IV SOLN
2.5000 mg | Freq: Three times a day (TID) | INTRAVENOUS | Status: DC
  Administered 2017-08-04 – 2017-08-05 (×3): 5 mg via INTRAVENOUS
  Filled 2017-08-04 (×4): qty 5

## 2017-08-04 MED ORDER — LEVETIRACETAM IN NACL 500 MG/100ML IV SOLN
500.0000 mg | Freq: Two times a day (BID) | INTRAVENOUS | Status: DC
  Administered 2017-08-04 – 2017-08-05 (×3): 500 mg via INTRAVENOUS
  Filled 2017-08-04 (×3): qty 100

## 2017-08-04 MED ORDER — SODIUM CHLORIDE 0.9 % IV SOLN
1.0000 g | INTRAVENOUS | Status: DC
  Administered 2017-08-04 – 2017-08-05 (×2): 1 g via INTRAVENOUS
  Filled 2017-08-04 (×3): qty 10

## 2017-08-04 MED ORDER — SODIUM CHLORIDE 0.9 % IV SOLN
1.0000 g | Freq: Three times a day (TID) | INTRAVENOUS | Status: DC
  Administered 2017-08-04: 1 g via INTRAVENOUS
  Filled 2017-08-04 (×4): qty 1

## 2017-08-04 MED ORDER — SODIUM CHLORIDE 0.9 % IV SOLN
1.0000 g | Freq: Once | INTRAVENOUS | Status: AC
  Administered 2017-08-04: 1 g via INTRAVENOUS
  Filled 2017-08-04: qty 1

## 2017-08-04 NOTE — Progress Notes (Signed)
Hospitalist progress note   Joy Holden  UYQ:[id number] DOB: 10-May-1924 DOA: 08/03/2017 PCP: Patient, No Pcp Per  Specialists:    Brief Narrative:  84 year old skilled facility resident, left-sided CVA, HTN, HLD, asthma, bipolar, A. fib on Eliquis chads score >4, DCIS admitted with seizures 4/11 Usual baseline is that she is able to follow simple commands and can do IADLs such as feeding herself-apart from that cannot do anything further On admission CT showed left ACA territory encephalomalacia WBC 7 lactic acid 4 renal function okay no tachycardia no tachypnea  Assessment & Plan:   Seizure in setting of significant encephalomalacia and prior CVA-loaded in the emergency room with 1 g Keppra-PRN Ativan seizure-Keppra level = pending, valproic acid 33  Noninfectious lactic acidosis secondary to seizure continue IV saline 100 cc/h, repeat in a.m. and discontinue if more awake alert in order to eat  Acute toxic metabolic encephalopathy likely secondary to seizure-monitor  ?  UTI-NGTD, previously has been treated 06/2015 E. coli we will continue ceftriaxone and discontinue aztreonam started on admission--BC X2, UC currently pending 4/11  HLD, hold home meds given confusion  HTN hydralazine as needed IV  Bipolar-Wellbutrin on hold given sleepiness  A. fib prior CVA chads score >4-Eliquis was held will start Lovenox per pharmacy continue IV metoprolol 2.5 q. 8  DM TY 2 from complication of stroke-metformin PTA current sugars are controlled 126-180 A1c was 9 given poor overall prognosis would not escalate to insulin as outpatient  Lovenox treatment dose, no family present, DNR confirmed, inpatient pending resolution probably 2-3 days   Consultants:   Neurology  Procedures:   EEG showed no epileptiform activity with decreased beta activity in left hemisphere  Antimicrobials:   None  Subjective: Sleepy and arousable but falls right back to sleep  Objective: Vitals:   08/04/17 0401 08/04/17 0746 08/04/17 1226 08/04/17 1525  BP: 138/63 (!) 141/79 (!) 155/95 123/89  Pulse: 81 (!) 53 (!) 57 80  Resp: 18 14 18 16   Temp: 98 F (36.7 C) (!) 97.5 F (36.4 C) 97.7 F (36.5 C) 98.1 F (36.7 C)  TempSrc: Axillary Oral Oral Oral  SpO2: 100% 100% 100% 98%  Weight:      Height:        Intake/Output Summary (Last 24 hours) at 08/04/2017 1605 Last data filed at 08/04/2017 0402 Gross per 24 hour  Intake -  Output 200 ml  Net -200 ml   Filed Weights   08/04/17 0000 08/04/17 0055  Weight: 65.3 kg (144 lb) 69.4 kg (153 lb)    Examination: S1-S2 no murmur arouses but then sleeps Abdomen soft Pupils equally reactive-no further exam attempted given sleepiness and patient loaded with medication   Data Reviewed: I have personally reviewed following labs and imaging studies  CBC: Recent Labs  Lab 08/03/17 2116 08/04/17 0225  WBC 7.9 8.6  NEUTROABS 5.8  --   HGB 14.3 15.0  HCT 44.0 46.5*  MCV 96.1 96.5  PLT 203 638   Basic Metabolic Panel: Recent Labs  Lab 08/03/17 2116 08/04/17 0225  NA 139 139  K 3.5 4.0  CL 104 104  CO2 21* 23  GLUCOSE 219* 180*  BUN 19 18  CREATININE 0.75 0.65  CALCIUM 8.7* 8.9   GFR: Estimated Creatinine Clearance: 42.6 mL/min (by C-G formula based on SCr of 0.65 mg/dL). Liver Function Tests: Recent Labs  Lab 08/03/17 2116  AST 19  ALT 15  ALKPHOS 93  BILITOT 0.5  PROT 7.0  ALBUMIN 3.3*  No results for input(s): LIPASE, AMYLASE in the last 168 hours. No results for input(s): AMMONIA in the last 168 hours. Coagulation Profile: No results for input(s): INR, PROTIME in the last 168 hours. Cardiac Enzymes:  Radiology Studies: Reviewed images personally in health database   Scheduled Meds: . heparin  5,000 Units Subcutaneous Q8H  . insulin aspart  0-5 Units Subcutaneous QHS  . insulin aspart  0-9 Units Subcutaneous TID WC  . mouth rinse  15 mL Mouth Rinse BID  . metoprolol tartrate  2.5-5 mg Intravenous  Q8H   Continuous Infusions: . sodium chloride 100 mL/hr at 08/04/17 0001  . cefTRIAXone (ROCEPHIN)  IV    . levETIRAcetam Stopped (08/04/17 1109)     LOS: 1 day    Time spent: [place], MD [facility]  If 7PM-7AM, please contact night-coverage [email or web address] Password [facility] 08/04/2017, 4:05 PM

## 2017-08-04 NOTE — Progress Notes (Signed)
Telemetry called RN to inform her of pt having 2.05sec pause; pt remains A fib on the monitor; asymptomatic; MD paged and notified, no new orders received. Will continue to monitor pt closely. Delia Heady RN

## 2017-08-04 NOTE — Progress Notes (Signed)
Pharmacy Antibiotic Note  JULIEANA ESHLEMAN is a 84 y.o. female admitted on 08/03/2017 with UTI.  Pharmacy has been consulted for aztreonam dosing.  Plan: Aztreonam 1g IV Q8H.  Height: 5\' 2"  (157.5 cm) Weight: 144 lb (65.3 kg) IBW/kg (Calculated) : 50.1  Temp (24hrs), Avg:97.9 F (36.6 C), Min:97.9 F (36.6 C), Max:97.9 F (36.6 C)  Recent Labs  Lab 08/03/17 2116 08/03/17 2122  WBC 7.9  --   CREATININE 0.75  --   LATICACIDVEN  --  4.63*    Estimated Creatinine Clearance: 41.5 mL/min (by C-G formula based on SCr of 0.75 mg/dL).    Allergies  Allergen Reactions  . Penicillins Rash    No other information available at this time  . Aspirin Other (See Comments)    Burning in stomach  . Morphine Other (See Comments)    delusions     Thank you for allowing pharmacy to be a part of this patient's care.  Wynona Neat, PharmD, BCPS  08/04/2017 12:54 AM

## 2017-08-04 NOTE — Progress Notes (Signed)
Patient arrived to floor approximately 0100 via stretcher. Being admitted from [facility], patient is arousable to painful stimuli. Admitted for seizure. Pads placed on bed, suction and oxygen set up. Patients has generalized edema with 1 plus pitting bilaterally on hands. Code status DNR. Armband attached.  She is on Cardiac monitor [phone] . She has been difficult to assess, as she is unresponsive to voice or command. She was being treated for UTI at [facility] and is getting IV antibiotics for this. Bed alarm on. Safety maintained.

## 2017-08-04 NOTE — Procedures (Signed)
HPI:  84 y/o with new onset seizure.  History of stroke  TECHNICAL SUMMARY:  A multichannel referential and bipolar montage EEG using the standard international 10-20 system was performed on the patient.  There is 6HZ  rhythm in all head regions.  4 HZ activity is noted overriding on the left.  There is decreased beta activity in the L hemisphere compared to the right.  There is decreased amplitude on the left compared to the right.   ACTIVATION:  Stepwise photic stimulation and hyperventilation was not performed  EPILEPTIFORM ACTIVITY:  There were no spikes, sharp waves or paroxysmal activity.  SLEEP:  No sleep is noted   IMPRESSION:  This is an abnormal EEG demonstrating slowing, decreased amplitude and decreased beta activity in left hemisphere compared to that of the right.  If not already done so a structural lesion in this region should be ruled out.  In addition to the focal slowing, there is diffuse slowing of electrocerebral activity which can be seen in a wide variety of encephalopathic states including those of a toxic, metabolic, or degenerative nature.  No epileptiform activity was recorded on this EEG.  Correlate clinically.

## 2017-08-04 NOTE — Progress Notes (Signed)
Initial Nutrition Assessment  DOCUMENTATION CODES:   Not applicable  INTERVENTION:  Monitor for diet advancement  NUTRITION DIAGNOSIS:   Inadequate oral intake related to inability to eat as evidenced by NPO status.  GOAL:   Patient will meet greater than or equal to 90% of their needs  MONITOR:   Diet advancement, Skin  REASON FOR ASSESSMENT:   Low Braden    ASSESSMENT:    Joy Holden is a 84 y.o. female with medical history significant of stroke with right-sided weakness, hypertension, hyperlipoidemia asthma, depression, atrial fibrillation on Eliquis, DCIS breast cancer, who presents with seizures.  Attempted to speak with patient at bedside but she was somnolent. RD shook patient repeatedly attempting to wake her but she was unarousable. Upon admit she received Ativan and Keppra. Head CT showed L frontal encephalmolacia per neurology. Weight appears to be up 9 pounds per chart. Palliative consult ordered to determine [place].  Labs reviewed Medications reviewed and include:  Insulin NS at 148mL/hr  NUTRITION - FOCUSED PHYSICAL EXAM:    Most Recent Value  Orbital Region  No depletion  Upper Arm Region  No depletion  Thoracic and Lumbar Region  No depletion  Buccal Region  No depletion  Temple Region  Mild depletion  Clavicle Bone Region  No depletion  Clavicle and Acromion Bone Region  No depletion  Scapular Bone Region  No depletion  Dorsal Hand  No depletion  Patellar Region  No depletion  Anterior Thigh Region  No depletion  Posterior Calf Region  No depletion       Diet Order:  Seizure precautions Diet NPO time specified  EDUCATION NEEDS:   Not appropriate for education at this time  Skin:  Skin Assessment: Reviewed RN Assessment  Last BM:  PTA  Height:   Ht Readings from Last 1 Encounters:  08/04/17 5\' 2"  (1.575 m)    Weight:   Wt Readings from Last 1 Encounters:  08/04/17 153 lb (69.4 kg)    Ideal Body Weight:  50 kg  BMI:  Body  mass index is 27.98 kg/m.  Estimated Nutritional Needs:   Kcal:  1300-1500 calories (MSJ x1.2-1.4)  Protein:  70-95 grams  Fluid:  >1.5L   Satira Anis. Bowie Delia, MS, RD LDN Inpatient Clinical Dietitian Pager [phone]

## 2017-08-04 NOTE — Progress Notes (Signed)
EEG completed; results pending.    

## 2017-08-04 NOTE — Progress Notes (Addendum)
Neurology Progress Note  Joy Holden is a 84 year old female with hypertension, A. fib on Eliquis and stroke in March 2017 with residual right hemiparesis and aphasia.  Patient lives in a nursing home and was brought to the hospital after nursing staff noted right facial and right arm twitching that began around 5 PM yesterday. She received 1 g of Keppra along with 2 mg of Ativan which stopped her seizures.  Head CT revealed old left frontal encephalomalacia.  Currently on exam patient does not show any clinical seizures, she is sleeping restfully but awakens with noxious stimuli.  Neuro: Mental Status: Patient is very lethargic, grimaces to pain. She is nonverbal and does not follow commands. Cranial Nerves: II: Pupils are 2 mm bilaterally reactive.  Blink to threat was not able be assessed due to patient clenching eyes shut.  III,IV, VI: no forced gaze deviation noted.  VII Facial : mild right facial droop X: unable to assess XI: unable to assess XII: tongue is midline without atrophy or fasciculations.  Motor: Increased tone in the right arm with contraction of right hand along with increased tone of right  leg, not withdrawing to pain on the right side. She withdraws to pain in left arm and left leg.  Does localize with left arm to sternal rub Sensory: Unable to test sensation in detail  Deep Tendon Reflexes: 1+ bilaterally Plantars: Toes are downgoing bilaterally  EEG was obtained today was abnormal, demonstrating slowing, decreased amplitude and decreased beta activity in left hemisphere compared to the right.    As noted above CT did show left frontal encephalomalacia which would correlate with decreased beta activity on the left side.  Also noted was diffuse slowing of electrocerebral activity which can be seen in a wide variety of encephalopathic states including those of a toxic, metabolic or degenerative nature.  No epileptiform activity was noted on EEG.  ASSESSMENT AND PLAN 84  y.o. female with history of HTN, Afib on Eliquis, Stroke in March 2017 with residual right hemiparesis and aphasia who lives in a nursing home presents with right gaze deviation and right arm and leg jerking for a few hours concerning for focal status epilepticus. Resolved after receiving 6 mg of  And 1 g of Keppra IV. Etiology for focal status likely from old stroke.  Focal status epilepticus - at this point resolved and EEG showing no epileptiform activity --Continue Keppra 500mg  BID --Seizure precautions --Check for infection and metabolic abnormalities--urinalysis did show large amounts of leukocytes and nitrites with urine culture pending--currently on Rocephin --Patient will need follow-up as an outpatient with neurology.   At this time neurology will sign off.    Electronically signed: Dr. Kerney Elbe

## 2017-08-04 NOTE — Progress Notes (Signed)
UTA pt fully during shift due to pt drowsy and sleeping through entire shift. Pt only responded to pain, didn't open eyes or verbally responsive during shift. VSS remained stable during shift. Reported off to oncoming RN. Delia Heady RN

## 2017-08-05 LAB — GLUCOSE, CAPILLARY
Glucose-Capillary: 142 mg/dL — ABNORMAL HIGH (ref 65–99)
Glucose-Capillary: 134 mg/dL — ABNORMAL HIGH (ref 65–99)
Glucose-Capillary: 158 mg/dL — ABNORMAL HIGH (ref 65–99)
Glucose-Capillary: 193 mg/dL — ABNORMAL HIGH (ref 65–99)

## 2017-08-05 LAB — URINE CULTURE

## 2017-08-05 MED ORDER — DIVALPROEX SODIUM 125 MG PO CSDR
500.0000 mg | DELAYED_RELEASE_CAPSULE | Freq: Every day | ORAL | Status: DC
  Administered 2017-08-05 – 2017-08-06 (×2): 500 mg via ORAL
  Filled 2017-08-05 (×2): qty 4

## 2017-08-05 MED ORDER — APIXABAN 5 MG PO TABS
5.0000 mg | ORAL_TABLET | Freq: Two times a day (BID) | ORAL | Status: DC
  Administered 2017-08-05 – 2017-08-06 (×3): 5 mg via ORAL
  Filled 2017-08-05 (×3): qty 1

## 2017-08-05 MED ORDER — BACLOFEN 10 MG PO TABS
10.0000 mg | ORAL_TABLET | Freq: Three times a day (TID) | ORAL | Status: DC
  Administered 2017-08-05 – 2017-08-06 (×3): 10 mg via ORAL
  Filled 2017-08-05 (×3): qty 1

## 2017-08-05 MED ORDER — BUPROPION HCL 100 MG PO TABS
100.0000 mg | ORAL_TABLET | Freq: Every day | ORAL | Status: DC
  Administered 2017-08-05: 100 mg via ORAL
  Filled 2017-08-05: qty 1

## 2017-08-05 MED ORDER — DILTIAZEM HCL 60 MG PO TABS
60.0000 mg | ORAL_TABLET | Freq: Three times a day (TID) | ORAL | Status: DC
  Administered 2017-08-05: 60 mg via ORAL
  Filled 2017-08-05 (×2): qty 1

## 2017-08-05 MED ORDER — MELATONIN 3 MG PO TABS
3.0000 mg | ORAL_TABLET | Freq: Every day | ORAL | Status: DC
  Administered 2017-08-05: 3 mg via ORAL
  Filled 2017-08-05: qty 1

## 2017-08-05 MED ORDER — DILTIAZEM HCL 30 MG PO TABS
30.0000 mg | ORAL_TABLET | Freq: Three times a day (TID) | ORAL | Status: DC
  Administered 2017-08-05 – 2017-08-06 (×3): 30 mg via ORAL
  Filled 2017-08-05 (×3): qty 1

## 2017-08-05 MED ORDER — LEVETIRACETAM 500 MG PO TABS
500.0000 mg | ORAL_TABLET | Freq: Two times a day (BID) | ORAL | Status: DC
  Administered 2017-08-05 – 2017-08-06 (×3): 500 mg via ORAL
  Filled 2017-08-05 (×3): qty 1

## 2017-08-05 NOTE — Progress Notes (Signed)
Telemetry called RN to inform her of pt remaining in A fib controlled in the 50-60's  HR but pt HR dropped to 37 and back up to 50-60's again. MD paged and notified. No new order received. Will continue to closely monitor pt as she remains asymptomatic. P. Angelica Pou RN

## 2017-08-05 NOTE — Evaluation (Signed)
Clinical/Bedside Swallow Evaluation Patient Details  Name: Joy Holden MRN: [id number] Date of Birth: [birth year]  Today's Date: 08/05/2017 Time: SLP Start Time (ACUTE ONLY): 2536 SLP Stop Time (ACUTE ONLY): 0950 SLP Time Calculation (min) (ACUTE ONLY): 25 min  Past Medical History:  Past Medical History:  Diagnosis Date  . Asthma   . Cancer ([place])   . Dysphagia   . Hematuria   . Hypertension   . Hypokalemia   . PAF (paroxysmal atrial fibrillation) ([place])   . Stroke [facility])    Past Surgical History:  Past Surgical History:  Procedure Laterality Date  . ABDOMINAL HYSTERECTOMY     PARTIAL  . APPENDECTOMY    . BREAST SURGERY    . CERVICAL SPINE SURGERY    . ECTOPIC PREGNANCY SURGERY    . MYOMECTOMY    . NECK SURGERY     c-spine  . RADIOLOGY WITH ANESTHESIA N/A 07/19/2015   Procedure: RADIOLOGY WITH ANESTHESIA;  Surgeon: Luanne Bras, MD;  Location: [place];  Service: Radiology;  Laterality: N/A;   HPI:  84 y.o. female with history of HTN, Afib on Eliquis, Stroke in March 2017 with residual right hemiparesis and aphasia who lives in a nursing home presents with right gaze deviation and right arm and leg jerking for a few hours concerning for focal status epilepticus. At baseline able to feed herself per notes.   Assessment / Plan / Recommendation Clinical Impression  Pt presents with functional oropharyngeal swallow with recognition/anticipation of approaching bolus, active mastication/oral prep, brisk swallow response.  There were no s/s of aspiration despite large, sequential sips of water - with and without straw.  Pt nodding, speaking in one-work utterances (yes, no, okay), smiling and interactive. She was able to feed  herself with hand-over-hand assist with left UE.  Recommend initiating a dysphagia 3 diet, thin liquids, give meds whole in puree.  No SLP f/u warranted - our services will sign off.  SLP Visit Diagnosis: Dysphagia, unspecified (R13.10)    Aspiration Risk  No limitations    Diet Recommendation   dys3/thin liquids  Medication Administration: Whole meds with puree    Other  Recommendations Oral Care Recommendations: Oral care BID   Follow up Recommendations None      Frequency and Duration            Prognosis        Swallow Study   General Date of Onset: 08/03/17 HPI: 84 y.o. female with history of HTN, Afib on Eliquis, Stroke in March 2017 with residual right hemiparesis and aphasia who lives in a nursing home presents with right gaze deviation and right arm and leg jerking for a few hours concerning for focal status epilepticus. Type of Study: Bedside Swallow Evaluation Previous Swallow Assessment: MBS 06/2015 recs for dys2/thin liquids Diet Prior to this Study: NPO Temperature Spikes Noted: No Respiratory Status: Room air History of Recent Intubation: No Behavior/Cognition: Alert;Cooperative Oral Cavity Assessment: Within Functional Limits Oral Care Completed by SLP: No Vision: Functional for self-feeding Self-Feeding Abilities: Needs assist Patient Positioning: Upright in bed Baseline Vocal Quality: Normal Volitional Cough: Cognitively unable to elicit Volitional Swallow: Unable to elicit    Oral/Motor/Sensory Function     Ice Chips Ice chips: Within functional limits   Thin Liquid Thin Liquid: Within functional limits Presentation: Cup;Straw    Nectar Thick Nectar Thick Liquid: Not tested   Honey Thick Honey Thick Liquid: Not tested   Puree Puree: Within functional limits   Solid   GO  Solid: Within functional limits        Joy Holden 08/05/2017,10:02 AM

## 2017-08-05 NOTE — Progress Notes (Signed)
Pt drowsy unable to follow command family at bed side.  Per family pt lives in [place] [facility] at [facility], weak on rt side r/t CVA 2 years ago. but able to feed self  with set up prior to this  admission.

## 2017-08-05 NOTE — Progress Notes (Signed)
Palliative Medicine RN Note: Rec'd a call from patient's NP at [facility]. She rec'd a copy of Dr Kirstie Mirza note stating that he is trying to get in touch with family. Beth reports that the patient has a legal guardian/POA who has set limits on contact with the family. Legal guardian is Insurance claims handler through [employer] [phone] ext [phone] Beth reports problems contacting Insurance claims handler.  Of note, there is a durable power of attorney in Epic naming Celester Morgan as primary and Saima Monterroso as secondary DURABLE powers of attorney. There is not any information about medical surrogates or HCPOA in the Epic system.  I attempted to call Rennis Petty and had to leave a message. I called every extension I could find at the company and was unable to reach a live person.  I spoke to [place] in Medical Records at [place], who was able to locate a copy of the guardianship paperwork. She will fax it to our office, and then I will update the demographics, speak with the RN, and place the form on the chart to be scanned into medical records.  1430 Form rec'd and placed in patient's shadow chart.  Marjie Skiff Mylie Mccurley, RN, BSN, [facility] Palliative Medicine Team 08/05/2017 2:23 PM Office [phone]

## 2017-08-05 NOTE — Progress Notes (Signed)
Pt was some how  awake early this am and  Fell back to sleep. Followed  simple command  She is a DNR and per daughter does not want  Tube feeding nor intubation.  SLP may   see  Pt  For. Swallow  Evaluation when pt become fully awake and following command.

## 2017-08-05 NOTE — NC FL2 (Signed)
[place] MEDICAID FL2 LEVEL OF CARE SCREENING TOOL     IDENTIFICATION  Patient Name: Joy Holden Birthdate: 1925/04/01 Sex: female Admission Date (Current Location): 08/03/2017  [facility] and [place] Number:  Herbalist and Address:  [facility]. [facility], [place] [address] [place], [place] [address]      Provider Number: [id number]  Attending Physician Name and Address:  Nita Sells, MD  Relative Name and Phone Number:       Current Level of Care: Hospital Recommended Level of Care: [place] Prior Approval Number:    Date Approved/Denied:   PASRR Number:    Discharge Plan: SNF    Current Diagnoses: Patient Active Problem List   Diagnosis Date Noted  . Acute metabolic encephalopathy 07/62/2633  . Stroke ([place]) 08/03/2017  . Diabetes mellitus without complication ([place]) 35/45/6256  . Seizure ([place]) 08/03/2017  . Lactic acid acidosis 08/03/2017  . Chronic a-fib ([place]) 08/02/2016  . Spastic hemiplegia of right dominant side due to cerebrovascular disease ([place]) 08/02/2016  . Vascular dementia without behavioral disturbance 08/02/2016  . Atonic neurogenic bladder 12/04/2015  . Protein-calorie malnutrition ([place]) 12/04/2015  . Decreased mobility 12/04/2015  . Major depression, chronic 11/09/2015  . Aphasia complicating stroke 38/93/7342  . Spastic hemiplegia affecting nondominant side ([place]) 09/30/2015  . Right homonymous hemianopsia 07/28/2015  . UTI (urinary tract infection) 07/28/2015  . Hypokalemia 07/28/2015  . Thrombocytopenia ([place]) 07/28/2015  . Paroxysmal atrial fibrillation ([facility])   . Hyperlipidemia LDL goal <70   . Aphasia   . Right hemiplegia ([place])   . Dysphagia   . Acute ischemic stroke ([place]) - Left MCA/ACA s/p IV TPA, IA TPA and mechanical thrombectomy 07/19/2015  . Essential hypertension 02/20/2015  . 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    Orientation RESPIRATION BLADDER Height & Weight      (unable to assess)  Normal Incontinent, External catheter Weight: 153 lb (69.4 kg) Height:  5\' 2"  (157.5 cm)  BEHAVIORAL SYMPTOMS/MOOD NEUROLOGICAL BOWEL NUTRITION STATUS    Convulsions/Seizures Incontinent Diet(dysphagia 3; thin liquids)  AMBULATORY STATUS COMMUNICATION OF NEEDS Skin   Extensive Assist Non-Verbally Normal                       Personal Care Assistance Level of Assistance  Bathing, Feeding, Dressing Bathing Assistance: Maximum assistance Feeding assistance: Limited assistance Dressing Assistance: Maximum assistance     Functional Limitations Info  Sight, Hearing, Speech Sight Info: Adequate Hearing Info: Adequate Speech Info: Impaired(not speaking)    SPECIAL CARE FACTORS FREQUENCY                       Contractures Contractures Info: Not present    Additional Factors Info  Code Status, Allergies, Psychotropic, Insulin Sliding Scale Code Status Info: DNR Allergies Info: Penicillins, Aspirin, Ciprofloxacin, Morphine Psychotropic Info: Wellbutrin 100mg  daily; Depakote sprinkle 500mg  daily Insulin Sliding Scale Info: 0-9 units 3x/day with meals; 0-5 units daily at bedtime       Current Medications (08/05/2017):  This is the current hospital active medication list Current Facility-Administered Medications  Medication Dose Route Frequency Provider Last Rate Last Dose  . acetaminophen (TYLENOL) suppository 650 mg  650 mg Rectal Q6H PRN Ivor Costa, MD      . albuterol (PROVENTIL) (2.5 MG/3ML) 0.083% nebulizer solution 2.5 mg  2.5 mg Nebulization Q4H PRN Ivor Costa, MD      . apixaban (ELIQUIS) tablet 5 mg  5  mg Oral BID Nita Sells, MD      . baclofen (LIORESAL) tablet 10 mg  10 mg Oral TID Nita Sells, MD      . buPROPion [facility]) tablet 100 mg  100 mg Oral Daily Nita Sells, MD      . cefTRIAXone (ROCEPHIN) 1 g in sodium chloride 0.9 % 100 mL IVPB  1 g Intravenous Q24H Nita Sells, MD   Stopped at  08/04/17 2000  . diltiazem (CARDIZEM) tablet 60 mg  60 mg Oral Q8H Samtani, Jai-Gurmukh, MD      . divalproex (DEPAKOTE SPRINKLE) capsule 500 mg  500 mg Oral Daily Samtani, Jai-Gurmukh, MD      . hydrALAZINE (APRESOLINE) injection 5 mg  5 mg Intravenous Q2H PRN Ivor Costa, MD      . insulin aspart (novoLOG) injection 0-5 Units  0-5 Units Subcutaneous QHS Ivor Costa, MD   2 Units at 08/04/17 0011  . insulin aspart (novoLOG) injection 0-9 Units  0-9 Units Subcutaneous TID WC Ivor Costa, MD   1 Units at 08/05/17 1203  . levETIRAcetam (KEPPRA) tablet 500 mg  500 mg Oral BID Nita Sells, MD      . LORazepam (ATIVAN) injection 1 mg  1 mg Intravenous Q2H PRN Ivor Costa, MD      . MEDLINE mouth rinse  15 mL Mouth Rinse BID Ivor Costa, MD   15 mL at 08/05/17 1115  . Melatonin TABS 3 mg  3 mg Oral QHS Samtani, Jai-Gurmukh, MD      . ondansetron (ZOFRAN) injection 4 mg  4 mg Intravenous Q8H PRN Ivor Costa, MD         Discharge Medications: Please see discharge summary for a list of discharge medications.  Relevant Imaging Results:  Relevant Lab Results:   Additional Information SS#: [id number]  Geralynn Ochs, LCSW

## 2017-08-05 NOTE — Progress Notes (Signed)
Palliative care progress note  Palliative Medicine Team consult was received.   Met with Ms. Charbonneau very briefly this a.m. as SLP was beginning evaluation.    I then called and spoke with her granddaughter, Lesly Rubenstein.  She reports that her grandmother has been living at [facility] and goal is to return to [facility].  We discussed long-term goal to be to add as much time in quality to her grandmother's life while avoiding interventions that are not likely to contribute to this.  Family has been clear in desire to pursue conservative therapies to see how she progresses while avoiding aggressive interventions (no artificial nutrition/hydration and no heroic measures in the event of cardiac or respiratory arrest).  I discussed with [place] Ar regarding a MOST form and how completion of this may be beneficial for them moving forward to delineate goals when she transitions back to [facility] and for future hospitalizations.  She is in agreement that this would likely beneficial.  She will call and see if any other members of the family will be coming to the hospital to visit today (Brianna lives out of town) and we can work to set up a meeting to discuss further at that time.   If there are urgent needs or questions please call [phone]. Thank you for consulting out team to assist with this patients care.  Micheline Rough, MD [facility] Team [phone]

## 2017-08-05 NOTE — Progress Notes (Signed)
Hospitalist progress note   Joy Holden  RAQ:[id number] DOB: June 10, 1924 DOA: 08/03/2017 PCP: Patient, No Pcp Per  Specialists:    Brief Narrative:  84 year old skilled facility resident, left-sided CVA, HTN, HLD, asthma, bipolar, A. fib on Eliquis chads score >4, DCIS admitted with seizures 4/11 Usual baseline is that she is able to follow simple commands and can do IADLs such as feeding herself-apart from that cannot do anything further On admission CT showed left ACA territory encephalomalacia WBC 7 lactic acid 4 renal function okay no tachycardia no tachypnea  Assessment & Plan:   Seizure in setting of significant encephalomalacia and prior CVA-loaded in the emergency room with 1 g Keppra-PRN Ativan seizure resume oral meds today given more alert Continue lorazepam 1 mg every 2 as needed-we will discontinue bupropion 100 daily as this is a seizure precipitant Continue Depakote 500 daily, will continue Keppra 500 twice daily for now  Noninfectious lactic acidosis secondary to seizure  -Saline locked and no further need for fluids  Acute toxic metabolic encephalopathy likely secondary to seizure-monitor  ?  UTI-NGTD, previously has been treated 06/2015 E. coli we will continue ceftriaxone and discontinue aztreonam started on admission--BC X2, UC currently pending 4/11 and would not use long-term  HLD, hold home meds given confusion  HTN hydralazine as needed IV  Bipolar-Wellbutrin on hold given sleepiness  A. fib prior CVA chads score >4-Eliquis resumed 4/12 continue IV metoprolol 2.5 q. 8  DM TY 2 from complication of stroke-metformin PTA current sugars are controlled 126-180 A1c was 9 given poor overall prognosis would not escalate to insulin as outpatient  Lovenox treatment dose, no family present, DNR confirmed, inpatient pending resolution probably 2-3 days   Consultants:   Neurology  Procedures:   EEG showed no epileptiform activity with decreased beta activity in  left hemisphere  Antimicrobials:   None  Subjective:  Slightly arousable but falls back to sleep Cannot obtain ROS from her   Objective: Vitals:   08/04/17 2357 08/05/17 0420 08/05/17 0907 08/05/17 1220  BP: (!) 166/98 (!) 166/97 (!) 157/78 120/88  Pulse: 72 73 63 (!) 54  Resp: 18 16 18 18   Temp: 98 F (36.7 C) 97.6 F (36.4 C) 98 F (36.7 C) 97.7 F (36.5 C)  TempSrc: Oral Oral Oral Oral  SpO2: 98% 98% 98%   Weight:      Height:        Intake/Output Summary (Last 24 hours) at 08/05/2017 1550 Last data filed at 08/05/2017 1200 Gross per 24 hour  Intake 1500 ml  Output 2350 ml  Net -850 ml   Filed Weights   08/04/17 0000 08/04/17 0055  Weight: 65.3 kg (144 lb) 69.4 kg (153 lb)    Examination:  More awake alert but no distress Apparently only eating and drinking a little bit No chest pain Nausea No vomiting Abdomen soft nontender no rebound   Data Reviewed: I have personally reviewed following labs and imaging studies  CBC: Recent Labs  Lab 08/03/17 2116 08/04/17 0225  WBC 7.9 8.6  NEUTROABS 5.8  --   HGB 14.3 15.0  HCT 44.0 46.5*  MCV 96.1 96.5  PLT 203 456   Basic Metabolic Panel: Recent Labs  Lab 08/03/17 2116 08/04/17 0225  NA 139 139  K 3.5 4.0  CL 104 104  CO2 21* 23  GLUCOSE 219* 180*  BUN 19 18  CREATININE 0.75 0.65  CALCIUM 8.7* 8.9   GFR: Estimated Creatinine Clearance: 42.6 mL/min (by C-G formula  based on SCr of 0.65 mg/dL). Liver Function Tests: Recent Labs  Lab 08/03/17 2116  AST 19  ALT 15  ALKPHOS 93  BILITOT 0.5  PROT 7.0  ALBUMIN 3.3*   No results for input(s): LIPASE, AMYLASE in the last 168 hours. No results for input(s): AMMONIA in the last 168 hours. Coagulation Profile: No results for input(s): INR, PROTIME in the last 168 hours. Cardiac Enzymes:  Radiology Studies: Reviewed images personally in health database   Scheduled Meds: . apixaban  5 mg Oral BID  . baclofen  10 mg Oral TID  . buPROPion   100 mg Oral Daily  . diltiazem  60 mg Oral Q8H  . divalproex  500 mg Oral Daily  . insulin aspart  0-5 Units Subcutaneous QHS  . insulin aspart  0-9 Units Subcutaneous TID WC  . levETIRAcetam  500 mg Oral BID  . mouth rinse  15 mL Mouth Rinse BID  . Melatonin  3 mg Oral QHS   Continuous Infusions: . cefTRIAXone (ROCEPHIN)  IV Stopped (08/04/17 2000)     LOS: 2 days    Time spent: [place], MD [facility]  If 7PM-7AM, please contact night-coverage [email or web address] Password [email or web address] 08/05/2017, 3:50 PM

## 2017-08-06 LAB — CBC
HEMATOCRIT: 40.9 % (ref 36.0–46.0)
HEMOGLOBIN: 13.8 g/dL (ref 12.0–15.0)
MCH: 31.8 pg (ref 26.0–34.0)
MCHC: 33.7 g/dL (ref 30.0–36.0)
MCV: 94.2 fL (ref 78.0–100.0)
Platelets: 181 10*3/uL (ref 150–400)
RBC: 4.34 MIL/uL (ref 3.87–5.11)
RDW: 12.8 % (ref 11.5–15.5)
WBC: 6.7 10*3/uL (ref 4.0–10.5)

## 2017-08-06 LAB — COMPREHENSIVE METABOLIC PANEL
ALBUMIN: 2.9 g/dL — AB (ref 3.5–5.0)
ALT: 18 U/L (ref 14–54)
ANION GAP: 9 (ref 5–15)
AST: 28 U/L (ref 15–41)
Alkaline Phosphatase: 88 U/L (ref 38–126)
BUN: 18 mg/dL (ref 6–20)
CO2: 23 mmol/L (ref 22–32)
Calcium: 8.3 mg/dL — ABNORMAL LOW (ref 8.9–10.3)
Chloride: 108 mmol/L (ref 101–111)
Creatinine, Ser: 0.62 mg/dL (ref 0.44–1.00)
GFR calc Af Amer: >60 mL/min (ref >60)
GFR calc non Af Amer: >60 mL/min (ref >60)
GLUCOSE: 146 mg/dL — AB (ref 65–99)
Potassium: 3.1 mmol/L — ABNORMAL LOW (ref 3.5–5.1)
SODIUM: 140 mmol/L (ref 135–145)
Total Bilirubin: 0.5 mg/dL (ref 0.3–1.2)
Total Protein: 6.2 g/dL — ABNORMAL LOW (ref 6.5–8.1)

## 2017-08-06 LAB — GLUCOSE, CAPILLARY
GLUCOSE-CAPILLARY: 161 mg/dL — AB (ref 65–99)
Glucose-Capillary: 151 mg/dL — ABNORMAL HIGH (ref 65–99)

## 2017-08-06 MED ORDER — DIVALPROEX SODIUM 125 MG PO CSDR: 500.0000 mg | DELAYED_RELEASE_CAPSULE | Freq: Every day | ORAL | 0 refills | Status: AC

## 2017-08-06 MED ORDER — LEVETIRACETAM 500 MG PO TABS: 500.0000 mg | ORAL_TABLET | Freq: Two times a day (BID) | ORAL | 0 refills | Status: AC

## 2017-08-06 MED ORDER — BACLOFEN 10 MG PO TABS: 10.0000 mg | ORAL_TABLET | Freq: Three times a day (TID) | ORAL | 0 refills | Status: AC

## 2017-08-06 MED ORDER — DILTIAZEM HCL 30 MG PO TABS: 30.0000 mg | ORAL_TABLET | Freq: Three times a day (TID) | ORAL | Status: AC

## 2017-08-06 NOTE — Discharge Summary (Addendum)
Physician Discharge Summary  Joy Holden:[id number] DOB: March 07, 1925 DOA: 08/03/2017  PCP: Patient, No Pcp Per  Admit date: 08/03/2017 Discharge date: 08/06/2017  Time spent: 25 minutes  Recommendations for Outpatient Follow-up:  1. D/c bupropion--I think could have precipitated Sz 2. See MAR for other changes--meds incompatible with long term survival were stopped 3. Check cbc and bmet 1 week 4. New med=Keppra, dosage change to Cardizem  Discharge Diagnoses:  Principal Problem:   Seizure ([place]) Active Problems:   Essential hypertension   Hyperlipidemia LDL goal <70   UTI (urinary tract infection)   Major depression, chronic   Chronic a-fib (HCC)   Stroke (HCC)   Diabetes mellitus without complication (HCC)   Lactic acid acidosis   Acute metabolic encephalopathy   Discharge Condition: gaurded  Diet recommendation: hh low salt  Filed Weights   08/04/17 0000 08/04/17 0055  Weight: 65.3 kg (144 lb) 69.4 kg (153 lb)    History of present illness:  84 year old skilled facility resident, left-sided CVA, HTN, HLD, asthma, bipolar, A. fib on Eliquis chads score >4, DCIS admitted with seizures 4/11 Usual baseline is that she is able to follow simple commands and can do IADLs such as feeding herself-apart from that cannot do anything further On admission CT showed left ACA territory encephalomalacia WBC 7 lactic acid 4 renal function okay no tachycardia no tachypnea  Hospital Course:   Seizure in setting of significant encephalomalacia and prior CVA-loaded in the emergency room with 1 g Keppra- we will discontinue bupropion 100 daily as this is a seizure precipitant Continue Depakote 500 daily Started on this admit and will continue Keppra 500 twice daily for now EEG was neg for active Sz  Noninfectious lactic acidosis secondary to seizure  -Saline locked and no further need for fluids  Pauses-less than 3 sec pause-cut back cardizem 60-->30  Acute toxic metabolic  encephalopathy likely secondary to seizure-monitor  ?  UTI-NGTD, previously has been treated 06/2015 E. coli we will continue ceftriaxone and discontinue aztreonam started on admission--BC X2, UC currently pending 4/11 multiple colonies=---NO FURTHER RX  HLD, hold home meds given confusion  HTN hydralazine as needed IV  Bipolar-Wellbutrin on hold given sleepiness  A. fib prior CVA chads score >4-Eliquis resumed 4/12 continue IV metoprolol 2.5 q. 8  DM TY 2 from complication of stroke-metformin PTA current sugars are controlled 126-180 A1c was 9 given poor overall prognosis would not escalate to insulin as outpatient  Discharge Exam: Vitals:   08/06/17 0758 08/06/17 1216  BP: 115/74 (!) 148/79  Pulse: (!) 55 66  Resp: 18 18  Temp: 98.8 F (37.1 C) 99 F (37.2 C)  SpO2: 99% 99%    General: awake alert pleasant oriented in nad Cardiovascular: s1 s 2no m/r/g Respiratory: clear no added sound abd soft nt nd no rebound  Discharge Instructions   Discharge Instructions    Diet - low sodium heart healthy   Complete by:  As directed    Increase activity slowly   Complete by:  As directed      Allergies as of 08/06/2017      Reactions   Penicillins Rash   No other information available at this time   Aspirin Other (See Comments)   Burning in stomach   Ciprofloxacin Other (See Comments)   Admitted to hospital with AMS and seizure after recent start   Morphine Other (See Comments)   delusions      Medication List    STOP taking these medications  atorvastatin 10 MG tablet Commonly known as:  LIPITOR   buPROPion 75 MG tablet Commonly known as:  WELLBUTRIN   ciprofloxacin 500 MG tablet Commonly known as:  CIPRO   ergocalciferol 50000 units capsule Commonly known as:  VITAMIN D2   Melatonin 3 MG Tabs   multivitamin tablet   polyethylene glycol packet Commonly known as:  MIRALAX / GLYCOLAX   potassium chloride 20 MEQ/15ML (10%) Soln     TAKE these  medications   acetaminophen 500 MG tablet Commonly known as:  TYLENOL Take 500 mg by mouth 2 (two) times daily.   apixaban 5 MG Tabs tablet Commonly known as:  ELIQUIS Take 1 tablet (5 mg total) by mouth 2 (two) times daily.   baclofen 10 MG tablet Commonly known as:  LIORESAL Take 1 tablet (10 mg total) by mouth 3 (three) times daily.   diltiazem 30 MG tablet Commonly known as:  CARDIZEM Take 1 tablet (30 mg total) by mouth every 8 (eight) hours. What changed:    medication strength  how much to take   divalproex 125 MG capsule Commonly known as:  DEPAKOTE SPRINKLE Take 4 capsules (500 mg total) by mouth daily. Start taking on:  08/07/2017   hydrALAZINE 25 MG tablet Commonly known as:  APRESOLINE Take 50 mg by mouth 3 (three) times daily. Hold for SBP<110   levETIRAcetam 500 MG tablet Commonly known as:  KEPPRA Take 1 tablet (500 mg total) by mouth 2 (two) times daily.   lisinopril 20 MG tablet Commonly known as:  PRINIVIL,ZESTRIL Take 20 mg by mouth daily.   metFORMIN 500 MG tablet Commonly known as:  GLUCOPHAGE Take 500 mg by mouth 2 (two) times daily with a meal.   saccharomyces boulardii 250 MG capsule Commonly known as:  FLORASTOR Take 250 mg by mouth 2 (two) times daily.      Allergies  Allergen Reactions  . Penicillins Rash    No other information available at this time  . Aspirin Other (See Comments)    Burning in stomach  . Ciprofloxacin Other (See Comments)    Admitted to hospital with AMS and seizure after recent start  . Morphine Other (See Comments)    delusions   Contact information for after-discharge care    Destination    [facility] .   Service:  Skilled Nursing Contact information: [address] [place] [phone]               The results of significant diagnostics from this hospitalization (including imaging, microbiology, ancillary and laboratory) are listed below for  reference.    Significant Diagnostic Studies: Ct Head Wo Contrast  Result Date: 08/03/2017 CLINICAL DATA:  Right facial and upper extremity twitching. History of CVA. EXAM: CT HEAD WITHOUT CONTRAST TECHNIQUE: Contiguous axial images were obtained from the base of the skull through the vertex without intravenous contrast. COMPARISON:  04/01/2017 head CT. FINDINGS: Brain: Stable prominent encephalomalacia in the left ACA territory involving the left frontal lobe and left basal ganglia with ex vacuo dilatation of the frontal horn of the left lateral ventricle. No evidence of parenchymal hemorrhage or extra-axial fluid collection. No mass lesion, mass effect, or midline shift. No CT evidence of acute infarction. Nonspecific moderate subcortical and periventricular white matter hypodensity, most in keeping with chronic small vessel ischemic change. No acute ventriculomegaly. Vascular: No acute abnormality. Skull: No evidence of calvarial fracture. Sinuses/Orbits: The visualized paranasal sinuses are essentially clear. Other: Tiny inferior left  mastoid effusion. Clear right mastoid air cells. IMPRESSION: 1.  No evidence of acute intracranial abnormality. 2. Prominent left ACA territory encephalomalacia. 3. Moderate chronic small vessel ischemic changes in the cerebral white matter. 4. Tiny nonspecific left mastoid effusion. Electronically Signed   By: Ilona Sorrel M.D.   On: 08/03/2017 21:39   Dg Chest Portable 1 View  Result Date: 08/03/2017 CLINICAL DATA:  Shortness of breath. EXAM: PORTABLE CHEST 1 VIEW COMPARISON:  Chest radiograph July 20, 2015 FINDINGS: The cardiac silhouette is mildly enlarged and unchanged. Calcified aortic knob. Mild chronic interstitial changes with strandy LEFT lung base and RIGHT midlung zone densities. No pleural effusion or focal consolidation. No pneumothorax. Osteopenia. Broad dextroscoliosis. IMPRESSION: Mild cardiomegaly and chronic interstitial changes. Bilateral  atelectasis/scarring. Aortic Atherosclerosis (ICD10-I70.0). Electronically Signed   By: Elon Alas M.D.   On: 08/03/2017 21:18    Microbiology: Recent Results (from the past 240 hour(s))  Urine Culture     Status: Abnormal   Collection Time: 08/04/17 12:18 AM  Result Value Ref Range Status   Specimen Description URINE, CLEAN CATCH  Final   Special Requests   Final    NONE Performed at [facility], [address], [place], [place] [address]    Culture MULTIPLE SPECIES PRESENT, SUGGEST RECOLLECTION (A)  Final   Report Status 08/05/2017 FINAL  Final  Culture, blood (Routine X 2) w Reflex to ID Panel     Status: None (Preliminary result)   Collection Time: 08/04/17  1:56 AM  Result Value Ref Range Status   Specimen Description BLOOD RIGHT HAND  Final   Special Requests   Final    BOTTLES DRAWN AEROBIC AND ANAEROBIC Blood Culture adequate volume   Culture   Final    NO GROWTH 1 DAY Performed at [facility], [place] [address], [place], [place] [address]    Report Status PENDING  Incomplete  MRSA PCR Screening     Status: None   Collection Time: 08/04/17  5:55 AM  Result Value Ref Range Status   MRSA by PCR NEGATIVE NEGATIVE Final    Comment:        The GeneXpert MRSA Assay (FDA approved for NASAL specimens only), is one component of a comprehensive MRSA colonization surveillance program. It is not intended to diagnose MRSA infection nor to guide or monitor treatment for MRSA infections. Performed at [facility], [place] [address] [place], [place] [address]   Culture, blood (Routine X 2) w Reflex to ID Panel     Status: None (Preliminary result)   Collection Time: 08/04/17  7:20 AM  Result Value Ref Range Status   Specimen Description BLOOD RIGHT ANTECUBITAL  Final   Special Requests   Final    BOTTLES DRAWN AEROBIC AND ANAEROBIC Blood Culture adequate volume   Culture   Final    NO GROWTH 1 DAY Performed at [facility], [place] [address],  [place], [place] [address]    Report Status PENDING  Incomplete     Labs: Basic Metabolic Panel: Recent Labs  Lab 08/03/17 2116 08/04/17 0225 08/06/17 0837  NA 139 139 140  K 3.5 4.0 3.1*  CL 104 104 108  CO2 21* 23 23  GLUCOSE 219* 180* 146*  BUN 19 18 18   CREATININE 0.75 0.65 0.62  CALCIUM 8.7* 8.9 8.3*   Liver Function Tests: Recent Labs  Lab 08/03/17 2116 08/06/17 0837  AST 19 28  ALT 15 18  ALKPHOS 93  88  BILITOT 0.5 0.5  PROT 7.0 6.2*  ALBUMIN 3.3* 2.9*   No results for input(s): LIPASE, AMYLASE in the last 168 hours. No results for input(s): AMMONIA in the last 168 hours. CBC: Recent Labs  Lab 08/03/17 2116 08/04/17 0225 08/06/17 0837  WBC 7.9 8.6 6.7  NEUTROABS 5.8  --   --   HGB 14.3 15.0 13.8  HCT 44.0 46.5* 40.9  MCV 96.1 96.5 94.2  PLT 203 194 181   Cardiac Enzymes: Recent Labs  Lab 08/03/17 2331  CKTOTAL 37*   BNP: BNP (last 3 results) No results for input(s): BNP in the last 8760 hours.  ProBNP (last 3 results) No results for input(s): PROBNP in the last 8760 hours.  CBG: Recent Labs  Lab 08/05/17 1200 08/05/17 1635 08/05/17 2154 08/06/17 0625 08/06/17 1108  GLUCAP 134* 158* 193* 161* 151*       Signed:  Nita Sells MD   [facility] 08/06/2017, 12:45 PM

## 2017-08-06 NOTE — Clinical Social Work Placement (Signed)
   CLINICAL SOCIAL WORK PLACEMENT  NOTE  Date:  08/06/2017  Patient Details  Name: Joy Holden MRN: [id number] Date of Birth: 01-21-1925  Clinical Social Work is seeking post-discharge placement for this patient at the [place] level of care (*CSW will initial, date and re-position this form in  chart as items are completed):      Patient/family provided with [place] Work Department's list of facilities offering this level of care within the geographic area requested by the patient (or if unable, by the patient's family).  Yes   Patient/family informed of their freedom to choose among providers that offer the needed level of care, that participate in Medicare, Medicaid or managed care program needed by the patient, have an available bed and are willing to accept the patient.      Patient/family informed of [facility]'s ownership interest in [facility] and [facility], as well as of the fact that they are under no obligation to receive care at these facilities.  PASRR submitted to EDS on       PASRR number received on 08/05/17     Existing PASRR number confirmed on       FL2 transmitted to all facilities in geographic area requested by pt/family on 08/05/17     FL2 transmitted to all facilities within larger geographic area on       Patient informed that his/her managed care company has contracts with or will negotiate with certain facilities, including the following:        Yes   Patient/family informed of bed offers received.  Patient chooses bed at [facility]     Physician recommends and patient chooses bed at      Patient to be transferred to [facility] on 08/06/17.  Patient to be transferred to facility by ptar     Patient family notified on 08/06/17 of transfer.  Name of family member notified:  Clarise Cruz ward, Guardian     PHYSICIAN       Additional Comment:     _______________________________________________ Eileen Stanford, LCSW 08/06/2017, 2:22 PM

## 2017-08-06 NOTE — Progress Notes (Signed)
Spoke with Insurance claims handler at [facility] and [place]. Darren Tourist information centre manager nephew of Marybelle Giraldo, Wilhelmenia Addis daughter in law are both cleared to receive information regarding pt healthcare. Cell phone number is [phone].

## 2017-08-06 NOTE — Clinical Social Work Note (Signed)
Clinical Social Worker facilitated patient discharge including contacting patient family and facility to confirm patient discharge plans.  Clinical information faxed to facility and family agreeable with plan.  CSW arranged ambulance transport via [facility] to [facility].  RN to call [phone] for report prior to discharge.  Clinical Social Worker will sign off for now as social work intervention is no longer needed. Please consult [place] again if new need arises.  [place], [place]

## 2017-08-09 LAB — CULTURE, BLOOD (ROUTINE X 2)
Culture: NO GROWTH
Culture: NO GROWTH
Special Requests: ADEQUATE
Special Requests: ADEQUATE

## 2018-06-25 DEATH — deceased

## 2018-09-02 IMAGING — CT CT HEAD W/O CM
4 series · 16 of 47 positions shown, 18 images · non-contrast
Comparison: 04/01/2017 head CT.

CLINICAL DATA: Right facial and upper extremity twitching. History
of CVA.

EXAM:
CT HEAD WITHOUT CONTRAST
TECHNIQUE: Contiguous axial images were obtained from the base of the skull
through the vertex without intravenous contrast.

[Series 3: head without · axial · non-contrast · 0.44mm/px · z∈[-178,-58]mm · 7 of 34 slices shown, 9 images]
[im 5/34  brain]
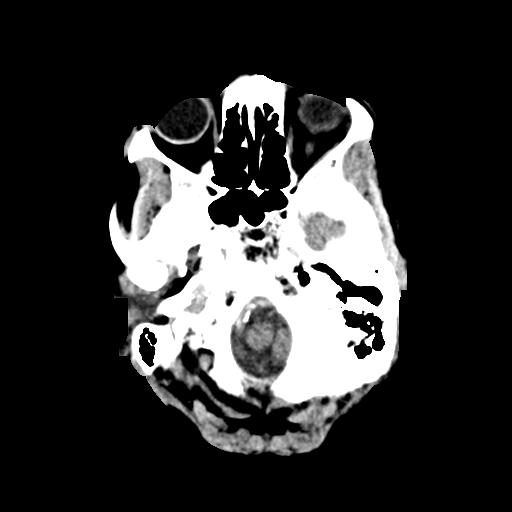
[im 5/34  bone]
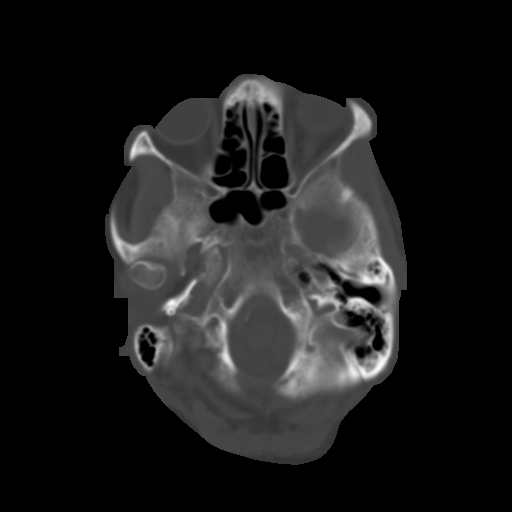
[im 9/34  brain]
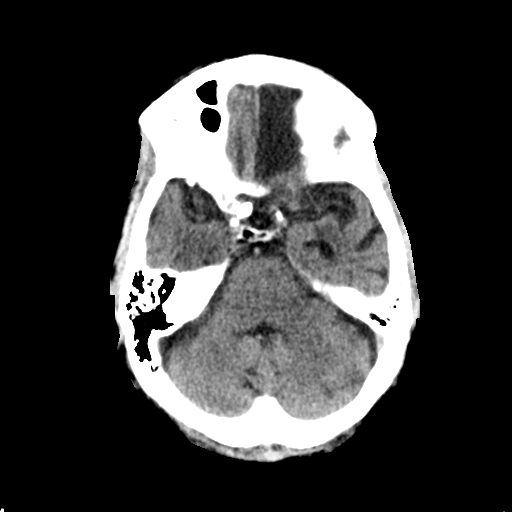
[im 13/34  brain]
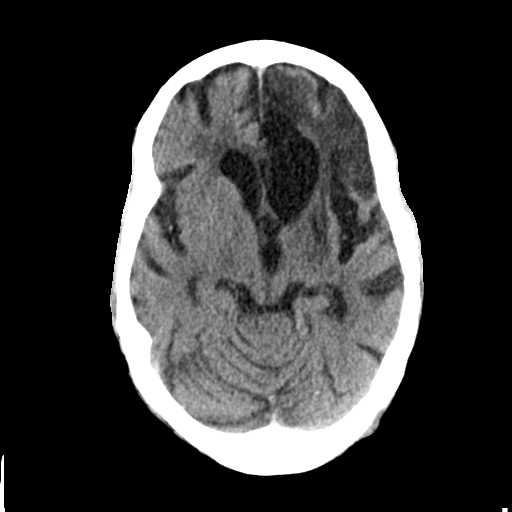
[im 17/34  brain]
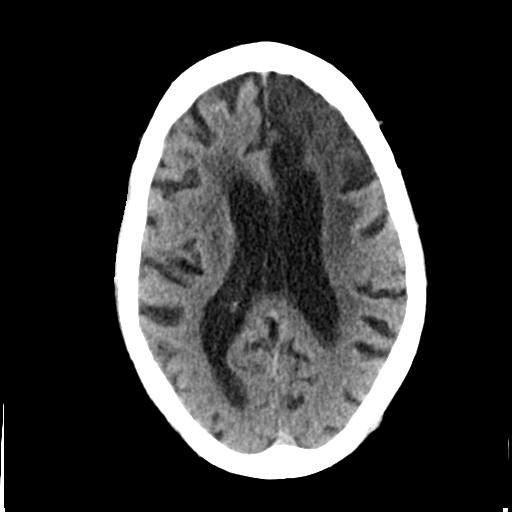
[im 21/34  brain]
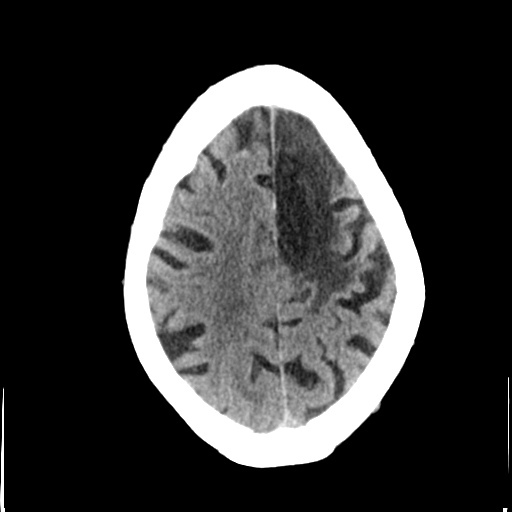
[im 21/34  bone]
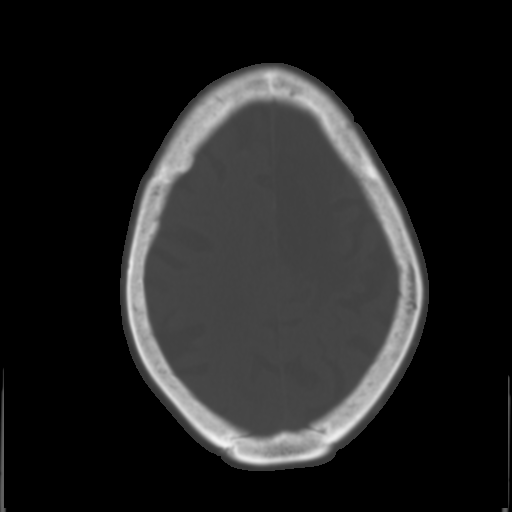
[im 25/34  brain]
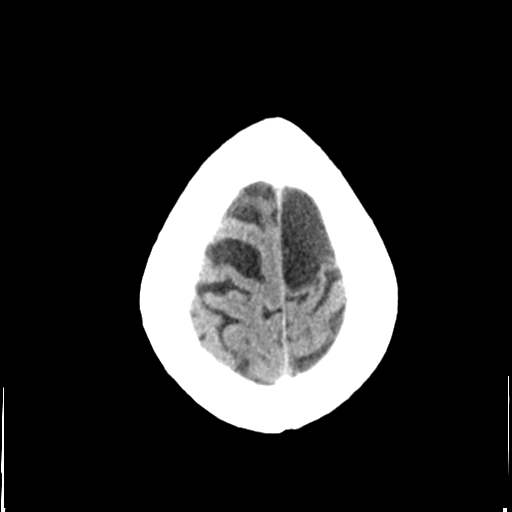
[im 29/34  brain]
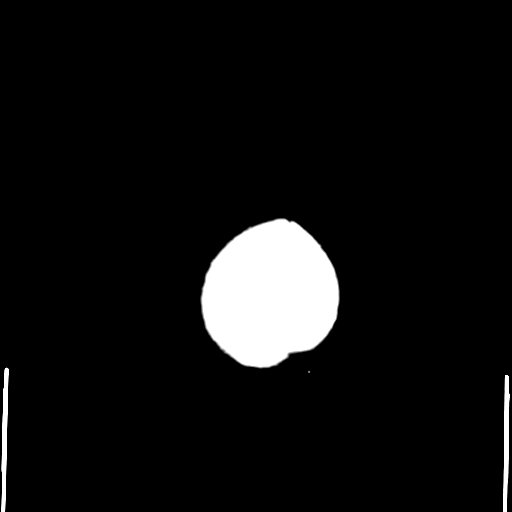

[Series 4: head bone · axial · 0.44mm/px · z∈[-182,-150]mm · 3 of 84 slices shown]
[im 9/84  bone]
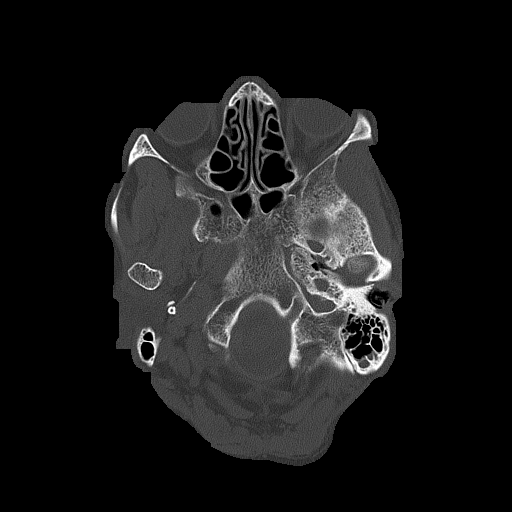
[im 17/84  bone]
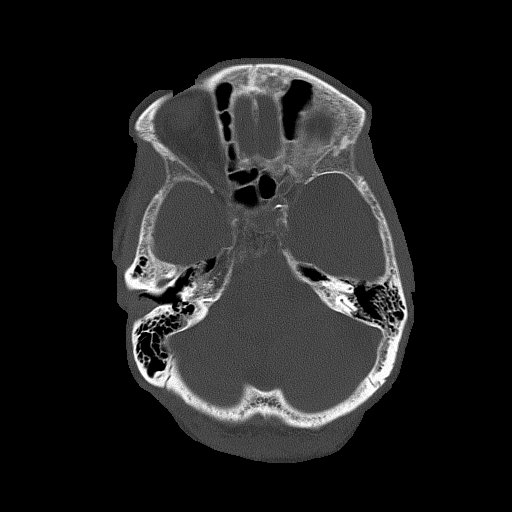
[im 25/84  bone]
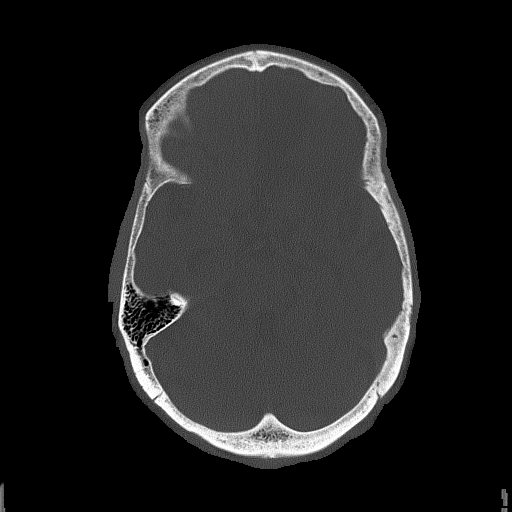

[Series 5: head without cor · coronal · non-contrast · 0.31mm/px · 3 of 71 slices shown]
[im 24/71  brain]
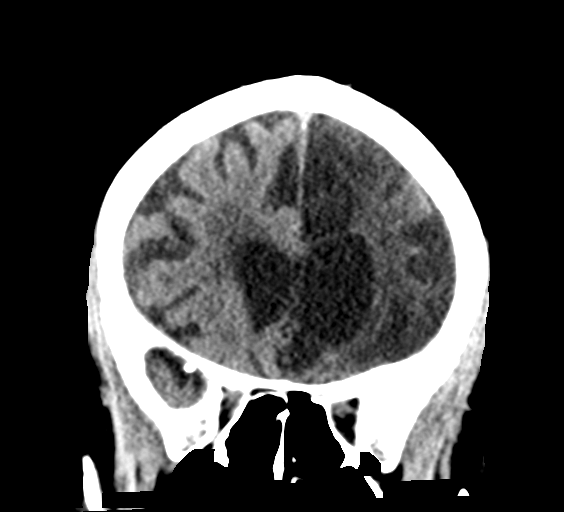
[im 32/71  brain]
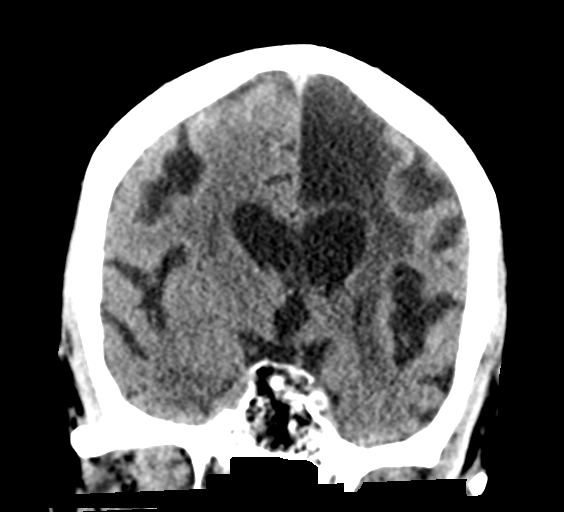
[im 39/71  brain]
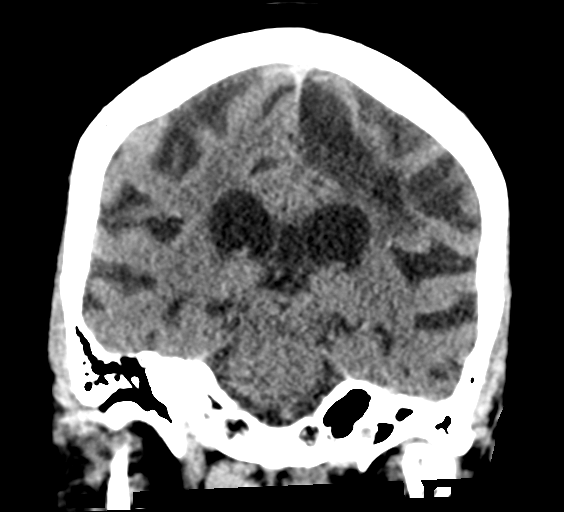

[Series 6: head without sag · sagittal · non-contrast · 0.32mm/px · 3 of 57 slices shown]
[im 19/57  brain]
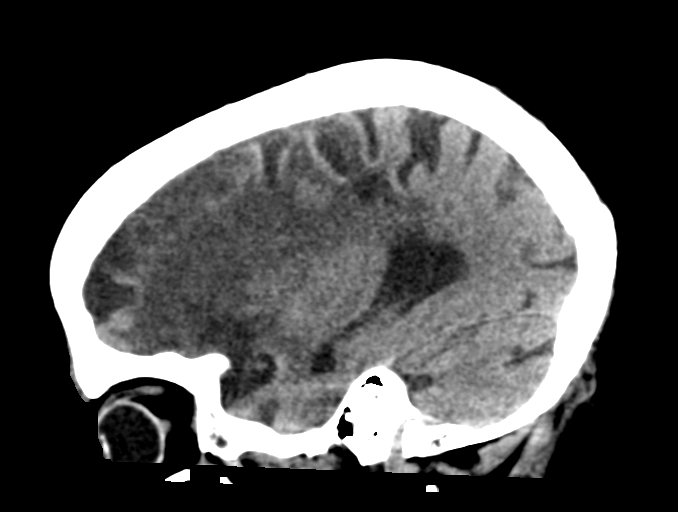
[im 29/57  brain]
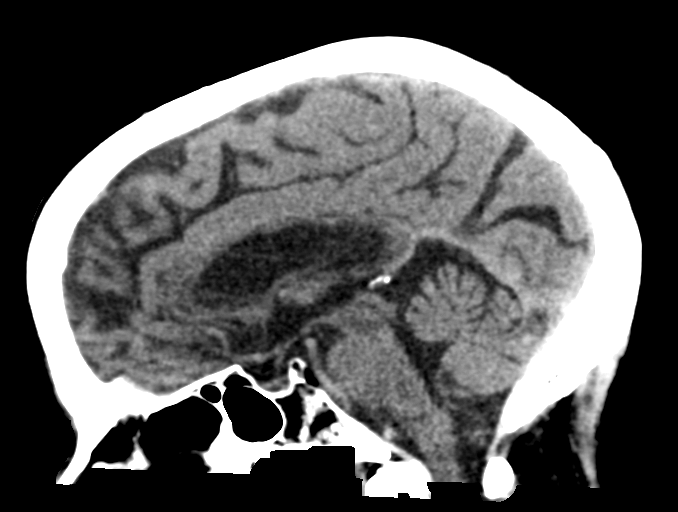
[im 38/57  brain]
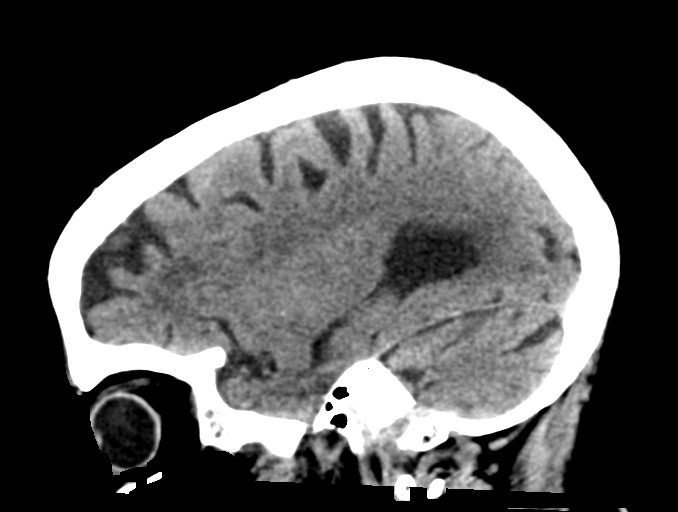

[16 of 47 positions shown; findings below may reference images not displayed]

FINDINGS: Brain: Stable prominent encephalomalacia in the left ACA territory
involving the left frontal lobe and left basal ganglia with ex vacuo
dilatation of the frontal horn of the left lateral ventricle. No
evidence of parenchymal hemorrhage or extra-axial fluid collection.
No mass lesion, mass effect, or midline shift. No CT evidence of
acute infarction. Nonspecific moderate subcortical and
periventricular white matter hypodensity, most in keeping with
chronic small vessel ischemic change. No acute ventriculomegaly.

Vascular: No acute abnormality.

Skull: No evidence of calvarial fracture.

Sinuses/Orbits: The visualized paranasal sinuses are essentially
clear.

Other: Tiny inferior left mastoid effusion. Clear right mastoid air
cells.
IMPRESSION: 1.  No evidence of acute intracranial abnormality.
2. Prominent left ACA territory encephalomalacia.
3. Moderate chronic small vessel ischemic changes in the cerebral
white matter.
4. Tiny nonspecific left mastoid effusion.
